# Patient Record
Sex: Female | Born: 1935 | Race: White | Hispanic: No | Smoking: Former smoker
Health system: Southern US, Community
[De-identification: ages and names within clinical notes are randomized; demographics above are authoritative.]

## PROBLEM LIST (undated history)

## (undated) DIAGNOSIS — I712 Thoracic aortic aneurysm, without rupture: Secondary | ICD-10-CM

## (undated) DIAGNOSIS — IMO0001 Reserved for inherently not codable concepts without codable children: Secondary | ICD-10-CM

## (undated) DIAGNOSIS — F329 Major depressive disorder, single episode, unspecified: Secondary | ICD-10-CM

## (undated) DIAGNOSIS — C8290 Follicular lymphoma, unspecified, unspecified site: Secondary | ICD-10-CM

## (undated) DIAGNOSIS — I10 Essential (primary) hypertension: Secondary | ICD-10-CM

## (undated) DIAGNOSIS — I7121 Aneurysm of the ascending aorta, without rupture: Secondary | ICD-10-CM

## (undated) DIAGNOSIS — F32A Depression, unspecified: Secondary | ICD-10-CM

## (undated) DIAGNOSIS — F17200 Nicotine dependence, unspecified, uncomplicated: Secondary | ICD-10-CM

## (undated) DIAGNOSIS — F419 Anxiety disorder, unspecified: Secondary | ICD-10-CM

## (undated) HISTORY — PX: TONSILLECTOMY: SUR1361

## (undated) HISTORY — DX: Nicotine dependence, unspecified, uncomplicated: F17.200

## (undated) HISTORY — DX: Reserved for inherently not codable concepts without codable children: IMO0001

## (undated) HISTORY — DX: Follicular lymphoma, unspecified, unspecified site: C82.90

## (undated) HISTORY — PX: OTHER SURGICAL HISTORY: SHX169

## (undated) HISTORY — PX: TUBAL LIGATION: SHX77

---

## 1999-10-04 ENCOUNTER — Encounter: Admission: RE | Admit: 1999-10-04 | Discharge: 1999-10-04 | Payer: Self-pay | Admitting: Family Medicine

## 1999-10-04 ENCOUNTER — Encounter: Payer: Self-pay | Admitting: Family Medicine

## 1999-11-19 ENCOUNTER — Encounter: Payer: Self-pay | Admitting: Family Medicine

## 1999-11-19 ENCOUNTER — Encounter: Admission: RE | Admit: 1999-11-19 | Discharge: 1999-11-19 | Payer: Self-pay | Admitting: Family Medicine

## 2000-02-07 ENCOUNTER — Ambulatory Visit (HOSPITAL_COMMUNITY): Admission: RE | Admit: 2000-02-07 | Discharge: 2000-02-07 | Payer: Self-pay | Admitting: Gastroenterology

## 2000-02-07 ENCOUNTER — Encounter (INDEPENDENT_AMBULATORY_CARE_PROVIDER_SITE_OTHER): Payer: Self-pay

## 2001-05-31 ENCOUNTER — Encounter: Admission: RE | Admit: 2001-05-31 | Discharge: 2001-05-31 | Payer: Self-pay | Admitting: Family Medicine

## 2001-05-31 ENCOUNTER — Encounter: Payer: Self-pay | Admitting: Family Medicine

## 2004-06-21 ENCOUNTER — Other Ambulatory Visit: Admission: RE | Admit: 2004-06-21 | Discharge: 2004-06-21 | Payer: Self-pay | Admitting: Family Medicine

## 2005-06-23 ENCOUNTER — Encounter: Admission: RE | Admit: 2005-06-23 | Discharge: 2005-06-23 | Payer: Self-pay | Admitting: Family Medicine

## 2006-10-09 ENCOUNTER — Other Ambulatory Visit: Admission: RE | Admit: 2006-10-09 | Discharge: 2006-10-09 | Payer: Self-pay | Admitting: Family Medicine

## 2007-03-29 ENCOUNTER — Encounter: Admission: RE | Admit: 2007-03-29 | Discharge: 2007-03-29 | Payer: Self-pay | Admitting: Family Medicine

## 2008-07-28 ENCOUNTER — Encounter: Admission: RE | Admit: 2008-07-28 | Discharge: 2008-07-28 | Payer: Self-pay | Admitting: Family Medicine

## 2009-06-15 ENCOUNTER — Other Ambulatory Visit: Admission: RE | Admit: 2009-06-15 | Discharge: 2009-06-15 | Payer: Self-pay | Admitting: Family Medicine

## 2010-04-23 ENCOUNTER — Encounter: Admission: RE | Admit: 2010-04-23 | Discharge: 2010-04-23 | Payer: Self-pay | Admitting: Family Medicine

## 2010-04-30 ENCOUNTER — Encounter: Admission: RE | Admit: 2010-04-30 | Discharge: 2010-04-30 | Payer: Self-pay | Admitting: Family Medicine

## 2010-05-10 ENCOUNTER — Ambulatory Visit (HOSPITAL_COMMUNITY): Admission: RE | Admit: 2010-05-10 | Discharge: 2010-05-10 | Payer: Self-pay | Admitting: Family Medicine

## 2010-05-14 ENCOUNTER — Ambulatory Visit: Payer: Self-pay | Admitting: Oncology

## 2010-05-21 LAB — COMPREHENSIVE METABOLIC PANEL
ALT: 18 U/L (ref 0–35)
AST: 25 U/L (ref 0–37)
Albumin: 4.1 g/dL (ref 3.5–5.2)
Alkaline Phosphatase: 77 U/L (ref 39–117)
Potassium: 4.2 mEq/L (ref 3.5–5.3)
Sodium: 136 mEq/L (ref 135–145)
Total Protein: 6.7 g/dL (ref 6.0–8.3)

## 2010-05-21 LAB — CBC WITH DIFFERENTIAL/PLATELET
BASO%: 0.7 % (ref 0.0–2.0)
EOS%: 3.7 % (ref 0.0–7.0)
Eosinophils Absolute: 0.3 10*3/uL (ref 0.0–0.5)
MCH: 33.8 pg (ref 25.1–34.0)
MCHC: 35 g/dL (ref 31.5–36.0)
MCV: 96.6 fL (ref 79.5–101.0)
MONO%: 10 % (ref 0.0–14.0)
NEUT#: 4.9 10*3/uL (ref 1.5–6.5)
RBC: 4.65 10*6/uL (ref 3.70–5.45)
RDW: 13.3 % (ref 11.2–14.5)

## 2010-05-24 LAB — HEPATITIS B CORE ANTIBODY, TOTAL: Hep B Core Total Ab: NEGATIVE

## 2010-05-24 LAB — HEPATITIS B CORE ANTIBODY, IGM: Hep B C IgM: NEGATIVE

## 2010-05-27 ENCOUNTER — Ambulatory Visit (HOSPITAL_COMMUNITY): Admission: RE | Admit: 2010-05-27 | Discharge: 2010-05-27 | Payer: Self-pay | Admitting: Oncology

## 2010-06-03 ENCOUNTER — Ambulatory Visit (HOSPITAL_COMMUNITY): Admission: RE | Admit: 2010-06-03 | Discharge: 2010-06-03 | Payer: Self-pay | Admitting: Oncology

## 2010-06-14 ENCOUNTER — Ambulatory Visit: Payer: Self-pay | Admitting: Oncology

## 2010-06-16 LAB — CBC WITH DIFFERENTIAL/PLATELET
BASO%: 0.7 % (ref 0.0–2.0)
Basophils Absolute: 0.1 10*3/uL (ref 0.0–0.1)
EOS%: 1.4 % (ref 0.0–7.0)
Eosinophils Absolute: 0.1 10*3/uL (ref 0.0–0.5)
HCT: 45.6 % (ref 34.8–46.6)
HGB: 16 g/dL — ABNORMAL HIGH (ref 11.6–15.9)
LYMPH%: 10.6 % — ABNORMAL LOW (ref 14.0–49.7)
MCH: 33.8 pg (ref 25.1–34.0)
MCHC: 35.1 g/dL (ref 31.5–36.0)
MCV: 96.4 fL (ref 79.5–101.0)
MONO#: 1 10*3/uL — ABNORMAL HIGH (ref 0.1–0.9)
MONO%: 12.6 % (ref 0.0–14.0)
NEUT#: 5.7 10*3/uL (ref 1.5–6.5)
NEUT%: 74.7 % (ref 38.4–76.8)
Platelets: 209 10*3/uL (ref 145–400)
RBC: 4.73 10*6/uL (ref 3.70–5.45)
RDW: 13.6 % (ref 11.2–14.5)
WBC: 7.6 10*3/uL (ref 3.9–10.3)
lymph#: 0.8 10*3/uL — ABNORMAL LOW (ref 0.9–3.3)

## 2010-06-16 LAB — LACTATE DEHYDROGENASE: LDH: 191 U/L (ref 94–250)

## 2010-06-16 LAB — COMPREHENSIVE METABOLIC PANEL
ALT: 16 U/L (ref 0–35)
AST: 25 U/L (ref 0–37)
Albumin: 4.8 g/dL (ref 3.5–5.2)
Alkaline Phosphatase: 81 U/L (ref 39–117)
BUN: 9 mg/dL (ref 6–23)
CO2: 19 mEq/L (ref 19–32)
Calcium: 10.1 mg/dL (ref 8.4–10.5)
Chloride: 102 mEq/L (ref 96–112)
Creatinine, Ser: 0.65 mg/dL (ref 0.40–1.20)
Glucose, Bld: 97 mg/dL (ref 70–99)
Potassium: 4.4 mEq/L (ref 3.5–5.3)
Sodium: 139 mEq/L (ref 135–145)
Total Bilirubin: 0.9 mg/dL (ref 0.3–1.2)
Total Protein: 7.3 g/dL (ref 6.0–8.3)

## 2010-06-16 LAB — URIC ACID: Uric Acid, Serum: 5.7 mg/dL (ref 2.4–7.0)

## 2010-06-30 LAB — COMPREHENSIVE METABOLIC PANEL
ALT: 18 U/L (ref 0–35)
CO2: 21 mEq/L (ref 19–32)
Calcium: 9.5 mg/dL (ref 8.4–10.5)
Chloride: 102 mEq/L (ref 96–112)
Creatinine, Ser: 0.69 mg/dL (ref 0.40–1.20)

## 2010-06-30 LAB — LACTATE DEHYDROGENASE: LDH: 170 U/L (ref 94–250)

## 2010-06-30 LAB — CBC WITH DIFFERENTIAL/PLATELET
BASO%: 0.2 % (ref 0.0–2.0)
Basophils Absolute: 0 10*3/uL (ref 0.0–0.1)
EOS%: 1.3 % (ref 0.0–7.0)
Eosinophils Absolute: 0.1 10*3/uL (ref 0.0–0.5)
HCT: 43.7 % (ref 34.8–46.6)
HGB: 15.3 g/dL (ref 11.6–15.9)
LYMPH%: 4.6 % — ABNORMAL LOW (ref 14.0–49.7)
MCH: 34.7 pg — ABNORMAL HIGH (ref 25.1–34.0)
MCHC: 34.9 g/dL (ref 31.5–36.0)
MCV: 99.3 fL (ref 79.5–101.0)
MONO#: 1 10*3/uL — ABNORMAL HIGH (ref 0.1–0.9)
MONO%: 17.9 % — ABNORMAL HIGH (ref 0.0–14.0)
NEUT#: 4.4 10*3/uL (ref 1.5–6.5)
NEUT%: 76 % (ref 38.4–76.8)
Platelets: 180 10*3/uL (ref 145–400)
RBC: 4.4 10*6/uL (ref 3.70–5.45)
RDW: 14 % (ref 11.2–14.5)
WBC: 5.8 10*3/uL (ref 3.9–10.3)
lymph#: 0.3 10*3/uL — ABNORMAL LOW (ref 0.9–3.3)

## 2010-07-14 ENCOUNTER — Ambulatory Visit: Payer: Self-pay | Admitting: Oncology

## 2010-07-14 LAB — CBC WITH DIFFERENTIAL/PLATELET
BASO%: 1 % (ref 0.0–2.0)
Basophils Absolute: 0.1 10*3/uL (ref 0.0–0.1)
EOS%: 1.9 % (ref 0.0–7.0)
Eosinophils Absolute: 0.1 10*3/uL (ref 0.0–0.5)
HCT: 43.9 % (ref 34.8–46.6)
HGB: 15.2 g/dL (ref 11.6–15.9)
LYMPH%: 11.6 % — ABNORMAL LOW (ref 14.0–49.7)
MCH: 33.4 pg (ref 25.1–34.0)
MCHC: 34.6 g/dL (ref 31.5–36.0)
MCV: 96.5 fL (ref 79.5–101.0)
MONO#: 0.9 10*3/uL (ref 0.1–0.9)
MONO%: 12.3 % (ref 0.0–14.0)
NEUT#: 5.3 10*3/uL (ref 1.5–6.5)
NEUT%: 73.2 % (ref 38.4–76.8)
Platelets: 141 10*3/uL — ABNORMAL LOW (ref 145–400)
RBC: 4.55 10*6/uL (ref 3.70–5.45)
RDW: 13.6 % (ref 11.2–14.5)
WBC: 7.2 10*3/uL (ref 3.9–10.3)
lymph#: 0.8 10*3/uL — ABNORMAL LOW (ref 0.9–3.3)

## 2010-07-14 LAB — LACTATE DEHYDROGENASE: LDH: 223 U/L (ref 94–250)

## 2010-07-14 LAB — COMPREHENSIVE METABOLIC PANEL
ALT: 21 U/L (ref 0–35)
AST: 27 U/L (ref 0–37)
Albumin: 4.6 g/dL (ref 3.5–5.2)
Alkaline Phosphatase: 83 U/L (ref 39–117)
BUN: 14 mg/dL (ref 6–23)
CO2: 21 mEq/L (ref 19–32)
Calcium: 9.5 mg/dL (ref 8.4–10.5)
Chloride: 102 mEq/L (ref 96–112)
Creatinine, Ser: 0.65 mg/dL (ref 0.40–1.20)
Glucose, Bld: 97 mg/dL (ref 70–99)
Potassium: 4.2 mEq/L (ref 3.5–5.3)
Sodium: 135 mEq/L (ref 135–145)
Total Bilirubin: 0.8 mg/dL (ref 0.3–1.2)
Total Protein: 6.7 g/dL (ref 6.0–8.3)

## 2010-07-14 LAB — URIC ACID: Uric Acid, Serum: 5.1 mg/dL (ref 2.4–7.0)

## 2010-08-10 LAB — CBC WITH DIFFERENTIAL/PLATELET
BASO%: 0.5 % (ref 0.0–2.0)
Basophils Absolute: 0 10*3/uL (ref 0.0–0.1)
EOS%: 1.7 % (ref 0.0–7.0)
Eosinophils Absolute: 0.1 10*3/uL (ref 0.0–0.5)
HCT: 41.7 % (ref 34.8–46.6)
HGB: 14.3 g/dL (ref 11.6–15.9)
LYMPH%: 15.2 % (ref 14.0–49.7)
MCH: 34.1 pg — ABNORMAL HIGH (ref 25.1–34.0)
MCHC: 34.3 g/dL (ref 31.5–36.0)
MCV: 99.2 fL (ref 79.5–101.0)
MONO#: 0.8 10*3/uL (ref 0.1–0.9)
MONO%: 16.2 % — ABNORMAL HIGH (ref 0.0–14.0)
NEUT#: 3.2 10*3/uL (ref 1.5–6.5)
NEUT%: 66.4 % (ref 38.4–76.8)
Platelets: 142 10*3/uL — ABNORMAL LOW (ref 145–400)
RBC: 4.21 10*6/uL (ref 3.70–5.45)
RDW: 13.6 % (ref 11.2–14.5)
WBC: 4.8 10*3/uL (ref 3.9–10.3)
lymph#: 0.7 10*3/uL — ABNORMAL LOW (ref 0.9–3.3)

## 2010-08-10 LAB — COMPREHENSIVE METABOLIC PANEL
ALT: 21 U/L (ref 0–35)
AST: 28 U/L (ref 0–37)
Albumin: 4.3 g/dL (ref 3.5–5.2)
Alkaline Phosphatase: 69 U/L (ref 39–117)
BUN: 9 mg/dL (ref 6–23)
CO2: 24 mEq/L (ref 19–32)
Calcium: 9.2 mg/dL (ref 8.4–10.5)
Chloride: 98 mEq/L (ref 96–112)
Creatinine, Ser: 0.61 mg/dL (ref 0.40–1.20)
Glucose, Bld: 84 mg/dL (ref 70–99)
Potassium: 3.8 mEq/L (ref 3.5–5.3)
Sodium: 130 mEq/L — ABNORMAL LOW (ref 135–145)
Total Bilirubin: 1.4 mg/dL — ABNORMAL HIGH (ref 0.3–1.2)
Total Protein: 6.9 g/dL (ref 6.0–8.3)

## 2010-08-10 LAB — LIPID PANEL
Cholesterol: 156 mg/dL (ref 0–200)
HDL: 67 mg/dL (ref >39)
LDL Cholesterol: 74 mg/dL (ref 0–99)
Total CHOL/HDL Ratio: 2.3 Ratio
Triglycerides: 77 mg/dL (ref <150)
VLDL: 15 mg/dL (ref 0–40)

## 2010-09-06 ENCOUNTER — Ambulatory Visit: Payer: Self-pay | Admitting: Oncology

## 2010-09-07 ENCOUNTER — Ambulatory Visit (HOSPITAL_COMMUNITY): Admission: RE | Admit: 2010-09-07 | Discharge: 2010-09-07 | Payer: Self-pay | Admitting: Oncology

## 2010-09-08 LAB — COMPREHENSIVE METABOLIC PANEL
ALT: 19 U/L (ref 0–35)
AST: 25 U/L (ref 0–37)
Albumin: 4.6 g/dL (ref 3.5–5.2)
Alkaline Phosphatase: 76 U/L (ref 39–117)
BUN: 11 mg/dL (ref 6–23)
CO2: 25 mEq/L (ref 19–32)
Calcium: 9.5 mg/dL (ref 8.4–10.5)
Chloride: 97 mEq/L (ref 96–112)
Creatinine, Ser: 0.64 mg/dL (ref 0.40–1.20)
Glucose, Bld: 92 mg/dL (ref 70–99)
Potassium: 4.3 mEq/L (ref 3.5–5.3)
Sodium: 132 mEq/L — ABNORMAL LOW (ref 135–145)
Total Bilirubin: 0.8 mg/dL (ref 0.3–1.2)
Total Protein: 6.5 g/dL (ref 6.0–8.3)

## 2010-09-08 LAB — CBC WITH DIFFERENTIAL/PLATELET
BASO%: 0.4 % (ref 0.0–2.0)
Basophils Absolute: 0 10*3/uL (ref 0.0–0.1)
EOS%: 3.1 % (ref 0.0–7.0)
Eosinophils Absolute: 0.2 10*3/uL (ref 0.0–0.5)
HCT: 40.1 % (ref 34.8–46.6)
HGB: 14.2 g/dL (ref 11.6–15.9)
LYMPH%: 8 % — ABNORMAL LOW (ref 14.0–49.7)
MCH: 34.4 pg — ABNORMAL HIGH (ref 25.1–34.0)
MCHC: 35.4 g/dL (ref 31.5–36.0)
MCV: 97.1 fL (ref 79.5–101.0)
MONO#: 0.8 10*3/uL (ref 0.1–0.9)
MONO%: 11.8 % (ref 0.0–14.0)
NEUT#: 5.3 10*3/uL (ref 1.5–6.5)
NEUT%: 76.7 % (ref 38.4–76.8)
Platelets: 119 10*3/uL — ABNORMAL LOW (ref 145–400)
RBC: 4.13 10*6/uL (ref 3.70–5.45)
RDW: 14 % (ref 11.2–14.5)
WBC: 6.9 10*3/uL (ref 3.9–10.3)
lymph#: 0.6 10*3/uL — ABNORMAL LOW (ref 0.9–3.3)
nRBC: 0 % (ref 0–0)

## 2010-10-06 ENCOUNTER — Ambulatory Visit: Payer: Self-pay | Admitting: Oncology

## 2010-10-06 LAB — COMPREHENSIVE METABOLIC PANEL
CO2: 22 mEq/L (ref 19–32)
Creatinine, Ser: 0.62 mg/dL (ref 0.40–1.20)
Glucose, Bld: 98 mg/dL (ref 70–99)
Sodium: 139 mEq/L (ref 135–145)
Total Bilirubin: 0.8 mg/dL (ref 0.3–1.2)
Total Protein: 6.4 g/dL (ref 6.0–8.3)

## 2010-10-06 LAB — CBC WITH DIFFERENTIAL/PLATELET
Basophils Absolute: 0 10*3/uL (ref 0.0–0.1)
Eosinophils Absolute: 0.1 10*3/uL (ref 0.0–0.5)
HCT: 38.9 % (ref 34.8–46.6)
LYMPH%: 9.4 % — ABNORMAL LOW (ref 14.0–49.7)
MONO#: 0.6 10*3/uL (ref 0.1–0.9)
NEUT#: 3.6 10*3/uL (ref 1.5–6.5)
NEUT%: 75.1 % (ref 38.4–76.8)
Platelets: 106 10*3/uL — ABNORMAL LOW (ref 145–400)
WBC: 4.7 10*3/uL (ref 3.9–10.3)

## 2010-10-06 LAB — MAGNESIUM: Magnesium: 1.8 mg/dL (ref 1.5–2.5)

## 2010-11-03 LAB — CBC WITH DIFFERENTIAL/PLATELET
BASO%: 1.9 % (ref 0.0–2.0)
Basophils Absolute: 0 10*3/uL (ref 0.0–0.1)
EOS%: 2.8 % (ref 0.0–7.0)
Eosinophils Absolute: 0.1 10*3/uL (ref 0.0–0.5)
HCT: 37.7 % (ref 34.8–46.6)
HGB: 13.4 g/dL (ref 11.6–15.9)
LYMPH%: 37 % (ref 14.0–49.7)
MCH: 34.8 pg — ABNORMAL HIGH (ref 25.1–34.0)
MCHC: 35.5 g/dL (ref 31.5–36.0)
MCV: 97.9 fL (ref 79.5–101.0)
MONO#: 0.8 10*3/uL (ref 0.1–0.9)
MONO%: 36.5 % — ABNORMAL HIGH (ref 0.0–14.0)
NEUT#: 0.5 10*3/uL — ABNORMAL LOW (ref 1.5–6.5)
NEUT%: 21.8 % — ABNORMAL LOW (ref 38.4–76.8)
Platelets: 110 10*3/uL — ABNORMAL LOW (ref 145–400)
RBC: 3.85 10*6/uL (ref 3.70–5.45)
RDW: 13.5 % (ref 11.2–14.5)
WBC: 2.1 10*3/uL — ABNORMAL LOW (ref 3.9–10.3)
lymph#: 0.8 10*3/uL — ABNORMAL LOW (ref 0.9–3.3)

## 2010-11-05 ENCOUNTER — Ambulatory Visit: Payer: Self-pay | Admitting: Oncology

## 2010-11-08 LAB — CBC WITH DIFFERENTIAL/PLATELET
Basophils Absolute: 0.1 10*3/uL (ref 0.0–0.1)
EOS%: 0.9 % (ref 0.0–7.0)
HCT: 40 % (ref 34.8–46.6)
HGB: 13.8 g/dL (ref 11.6–15.9)
LYMPH%: 3.6 % — ABNORMAL LOW (ref 14.0–49.7)
MCH: 35.3 pg — ABNORMAL HIGH (ref 25.1–34.0)
MCHC: 34.6 g/dL (ref 31.5–36.0)
MCV: 102.2 fL — ABNORMAL HIGH (ref 79.5–101.0)
NEUT%: 88.9 % — ABNORMAL HIGH (ref 38.4–76.8)
Platelets: 133 10*3/uL — ABNORMAL LOW (ref 145–400)
lymph#: 1.1 10*3/uL (ref 0.9–3.3)

## 2010-11-08 LAB — COMPREHENSIVE METABOLIC PANEL
ALT: 15 U/L (ref 0–35)
AST: 26 U/L (ref 0–37)
Alkaline Phosphatase: 134 U/L — ABNORMAL HIGH (ref 39–117)
Creatinine, Ser: 0.71 mg/dL (ref 0.40–1.20)
Sodium: 134 mEq/L — ABNORMAL LOW (ref 135–145)
Total Bilirubin: 0.5 mg/dL (ref 0.3–1.2)
Total Protein: 6.2 g/dL (ref 6.0–8.3)

## 2010-11-08 LAB — LACTATE DEHYDROGENASE: LDH: 353 U/L — ABNORMAL HIGH (ref 94–250)

## 2010-11-08 LAB — MAGNESIUM: Magnesium: 1.7 mg/dL (ref 1.5–2.5)

## 2010-12-02 LAB — CBC WITH DIFFERENTIAL/PLATELET
BASO%: 0.5 % (ref 0.0–2.0)
Basophils Absolute: 0 10e3/uL (ref 0.0–0.1)
EOS%: 1.9 % (ref 0.0–7.0)
Eosinophils Absolute: 0.1 10e3/uL (ref 0.0–0.5)
HCT: 39.1 % (ref 34.8–46.6)
HGB: 13.4 g/dL (ref 11.6–15.9)
LYMPH%: 5.1 % — ABNORMAL LOW (ref 14.0–49.7)
MCH: 35.4 pg — ABNORMAL HIGH (ref 25.1–34.0)
MCHC: 34.2 g/dL (ref 31.5–36.0)
MCV: 103.4 fL — ABNORMAL HIGH (ref 79.5–101.0)
MONO#: 0.9 10e3/uL (ref 0.1–0.9)
MONO%: 15.1 % — ABNORMAL HIGH (ref 0.0–14.0)
NEUT#: 4.6 10e3/uL (ref 1.5–6.5)
NEUT%: 77.4 % — ABNORMAL HIGH (ref 38.4–76.8)
Platelets: 203 10e3/uL (ref 145–400)
RBC: 3.78 10e6/uL (ref 3.70–5.45)
RDW: 14.6 % — ABNORMAL HIGH (ref 11.2–14.5)
WBC: 5.9 10e3/uL (ref 3.9–10.3)
lymph#: 0.3 10e3/uL — ABNORMAL LOW (ref 0.9–3.3)

## 2010-12-02 LAB — COMPREHENSIVE METABOLIC PANEL
AST: 25 U/L (ref 0–37)
Alkaline Phosphatase: 108 U/L (ref 39–117)
BUN: 12 mg/dL (ref 6–23)
Creatinine, Ser: 0.62 mg/dL (ref 0.40–1.20)
Potassium: 4.2 mEq/L (ref 3.5–5.3)

## 2010-12-02 LAB — MAGNESIUM: Magnesium: 1.6 mg/dL (ref 1.5–2.5)

## 2010-12-08 ENCOUNTER — Ambulatory Visit: Payer: Self-pay | Admitting: Oncology

## 2010-12-08 ENCOUNTER — Ambulatory Visit (HOSPITAL_COMMUNITY)
Admission: RE | Admit: 2010-12-08 | Discharge: 2010-12-08 | Payer: Self-pay | Source: Home / Self Care | Attending: Oncology | Admitting: Oncology

## 2010-12-08 LAB — GLUCOSE, CAPILLARY: Glucose-Capillary: 91 mg/dL (ref 70–99)

## 2010-12-09 LAB — CBC WITH DIFFERENTIAL/PLATELET
BASO%: 0.3 % (ref 0.0–2.0)
Basophils Absolute: 0 10*3/uL (ref 0.0–0.1)
EOS%: 3 % (ref 0.0–7.0)
Eosinophils Absolute: 0.2 10*3/uL (ref 0.0–0.5)
HCT: 38.6 % (ref 34.8–46.6)
HGB: 13.6 g/dL (ref 11.6–15.9)
LYMPH%: 5.1 % — ABNORMAL LOW (ref 14.0–49.7)
MCH: 35.1 pg — ABNORMAL HIGH (ref 25.1–34.0)
MCHC: 35.2 g/dL (ref 31.5–36.0)
MCV: 99.5 fL (ref 79.5–101.0)
MONO#: 0.8 10*3/uL (ref 0.1–0.9)
MONO%: 13.7 % (ref 0.0–14.0)
NEUT#: 4.6 10*3/uL (ref 1.5–6.5)
NEUT%: 77.9 % — ABNORMAL HIGH (ref 38.4–76.8)
Platelets: 163 10*3/uL (ref 145–400)
RBC: 3.88 10*6/uL (ref 3.70–5.45)
RDW: 13.7 % (ref 11.2–14.5)
WBC: 5.9 10*3/uL (ref 3.9–10.3)
lymph#: 0.3 10*3/uL — ABNORMAL LOW (ref 0.9–3.3)
nRBC: 0 % (ref 0–0)

## 2011-01-13 ENCOUNTER — Other Ambulatory Visit: Payer: Self-pay | Admitting: Oncology

## 2011-01-13 ENCOUNTER — Encounter (HOSPITAL_BASED_OUTPATIENT_CLINIC_OR_DEPARTMENT_OTHER): Payer: Medicare Other | Admitting: Oncology

## 2011-01-13 DIAGNOSIS — Z5112 Encounter for antineoplastic immunotherapy: Secondary | ICD-10-CM

## 2011-01-13 DIAGNOSIS — I10 Essential (primary) hypertension: Secondary | ICD-10-CM

## 2011-01-13 DIAGNOSIS — Z79899 Other long term (current) drug therapy: Secondary | ICD-10-CM

## 2011-01-13 DIAGNOSIS — C8589 Other specified types of non-Hodgkin lymphoma, extranodal and solid organ sites: Secondary | ICD-10-CM

## 2011-01-13 LAB — CBC WITH DIFFERENTIAL/PLATELET
BASO%: 0.5 % (ref 0.0–2.0)
Basophils Absolute: 0 10*3/uL (ref 0.0–0.1)
EOS%: 3 % (ref 0.0–7.0)
Eosinophils Absolute: 0.2 10*3/uL (ref 0.0–0.5)
HCT: 38.8 % (ref 34.8–46.6)
HGB: 13.6 g/dL (ref 11.6–15.9)
LYMPH%: 10.8 % — ABNORMAL LOW (ref 14.0–49.7)
MCH: 34.9 pg — ABNORMAL HIGH (ref 25.1–34.0)
MCHC: 35.1 g/dL (ref 31.5–36.0)
MCV: 99.5 fL (ref 79.5–101.0)
MONO#: 0.5 10*3/uL (ref 0.1–0.9)
MONO%: 9 % (ref 0.0–14.0)
NEUT#: 4.3 10*3/uL (ref 1.5–6.5)
NEUT%: 76.7 % (ref 38.4–76.8)
Platelets: 155 10*3/uL (ref 145–400)
RBC: 3.9 10*6/uL (ref 3.70–5.45)
RDW: 13.4 % (ref 11.2–14.5)
WBC: 5.6 10*3/uL (ref 3.9–10.3)
lymph#: 0.6 10*3/uL — ABNORMAL LOW (ref 0.9–3.3)
nRBC: 0 % (ref 0–0)

## 2011-01-13 LAB — COMPREHENSIVE METABOLIC PANEL
ALT: 13 U/L (ref 0–35)
AST: 19 U/L (ref 0–37)
Albumin: 3.9 g/dL (ref 3.5–5.2)
Alkaline Phosphatase: 63 U/L (ref 39–117)
BUN: 14 mg/dL (ref 6–23)
CO2: 26 mEq/L (ref 19–32)
Calcium: 8.9 mg/dL (ref 8.4–10.5)
Chloride: 104 mEq/L (ref 96–112)
Creatinine, Ser: 0.69 mg/dL (ref 0.40–1.20)
Glucose, Bld: 98 mg/dL (ref 70–99)
Potassium: 3.7 mEq/L (ref 3.5–5.3)
Sodium: 140 mEq/L (ref 135–145)
Total Bilirubin: 0.6 mg/dL (ref 0.3–1.2)
Total Protein: 5.6 g/dL — ABNORMAL LOW (ref 6.0–8.3)

## 2011-01-13 LAB — LACTATE DEHYDROGENASE: LDH: 138 U/L (ref 94–250)

## 2011-02-17 LAB — GLUCOSE, CAPILLARY: Glucose-Capillary: 98 mg/dL (ref 70–99)

## 2011-02-20 LAB — CBC
Hemoglobin: 16.4 g/dL — ABNORMAL HIGH (ref 12.0–15.0)
MCH: 34.4 pg — ABNORMAL HIGH (ref 26.0–34.0)
MCHC: 34.5 g/dL (ref 30.0–36.0)

## 2011-02-20 LAB — PROTIME-INR: Prothrombin Time: 12.4 seconds (ref 11.6–15.2)

## 2011-02-20 LAB — CHROMOSOME ANALYSIS, BONE MARROW

## 2011-02-21 LAB — CBC
MCHC: 34.5 g/dL (ref 30.0–36.0)
MCV: 99.9 fL (ref 78.0–100.0)
Platelets: 235 10*3/uL (ref 150–400)
RDW: 13.5 % (ref 11.5–15.5)
WBC: 7.5 10*3/uL (ref 4.0–10.5)

## 2011-02-21 LAB — BASIC METABOLIC PANEL
BUN: 11 mg/dL (ref 6–23)
CO2: 25 mEq/L (ref 19–32)
Chloride: 104 mEq/L (ref 96–112)
Creatinine, Ser: 0.68 mg/dL (ref 0.4–1.2)
Glucose, Bld: 103 mg/dL — ABNORMAL HIGH (ref 70–99)

## 2011-02-21 LAB — PROTIME-INR: Prothrombin Time: 12 seconds (ref 11.6–15.2)

## 2011-03-16 ENCOUNTER — Other Ambulatory Visit: Payer: Self-pay | Admitting: Oncology

## 2011-03-16 ENCOUNTER — Encounter (HOSPITAL_BASED_OUTPATIENT_CLINIC_OR_DEPARTMENT_OTHER): Payer: Medicare Other | Admitting: Oncology

## 2011-03-16 DIAGNOSIS — C8589 Other specified types of non-Hodgkin lymphoma, extranodal and solid organ sites: Secondary | ICD-10-CM

## 2011-03-16 DIAGNOSIS — C8590 Non-Hodgkin lymphoma, unspecified, unspecified site: Secondary | ICD-10-CM

## 2011-03-16 DIAGNOSIS — I10 Essential (primary) hypertension: Secondary | ICD-10-CM

## 2011-03-16 DIAGNOSIS — Z5112 Encounter for antineoplastic immunotherapy: Secondary | ICD-10-CM

## 2011-03-16 LAB — CBC WITH DIFFERENTIAL/PLATELET
BASO%: 0.3 % (ref 0.0–2.0)
LYMPH%: 8.8 % — ABNORMAL LOW (ref 14.0–49.7)
MCH: 35.3 pg — ABNORMAL HIGH (ref 25.1–34.0)
MCHC: 34.4 g/dL (ref 31.5–36.0)
MCV: 102.6 fL — ABNORMAL HIGH (ref 79.5–101.0)
MONO%: 9.2 % (ref 0.0–14.0)
NEUT%: 79.9 % — ABNORMAL HIGH (ref 38.4–76.8)
Platelets: 173 10*3/uL (ref 145–400)
RBC: 3.95 10*6/uL (ref 3.70–5.45)

## 2011-03-16 LAB — COMPREHENSIVE METABOLIC PANEL
ALT: 18 U/L (ref 0–35)
Alkaline Phosphatase: 74 U/L (ref 39–117)
Creatinine, Ser: 0.72 mg/dL (ref 0.40–1.20)
Sodium: 132 mEq/L — ABNORMAL LOW (ref 135–145)
Total Bilirubin: 0.7 mg/dL (ref 0.3–1.2)
Total Protein: 5.9 g/dL — ABNORMAL LOW (ref 6.0–8.3)

## 2011-03-17 ENCOUNTER — Encounter (HOSPITAL_BASED_OUTPATIENT_CLINIC_OR_DEPARTMENT_OTHER): Payer: Medicare Other | Admitting: Oncology

## 2011-03-17 DIAGNOSIS — Z5112 Encounter for antineoplastic immunotherapy: Secondary | ICD-10-CM

## 2011-03-17 DIAGNOSIS — C8589 Other specified types of non-Hodgkin lymphoma, extranodal and solid organ sites: Secondary | ICD-10-CM

## 2011-05-13 ENCOUNTER — Other Ambulatory Visit: Payer: Self-pay | Admitting: Oncology

## 2011-05-13 ENCOUNTER — Encounter (HOSPITAL_COMMUNITY): Payer: Self-pay

## 2011-05-13 ENCOUNTER — Ambulatory Visit (HOSPITAL_COMMUNITY)
Admission: RE | Admit: 2011-05-13 | Discharge: 2011-05-13 | Disposition: A | Discharge status: Home / Self Care | Payer: Medicare Other | Source: Ambulatory Visit | Attending: Oncology | Admitting: Oncology

## 2011-05-13 ENCOUNTER — Encounter (HOSPITAL_BASED_OUTPATIENT_CLINIC_OR_DEPARTMENT_OTHER): Payer: Medicare Other | Admitting: Oncology

## 2011-05-13 DIAGNOSIS — M899 Disorder of bone, unspecified: Secondary | ICD-10-CM | POA: Insufficient documentation

## 2011-05-13 DIAGNOSIS — I712 Thoracic aortic aneurysm, without rupture, unspecified: Secondary | ICD-10-CM | POA: Insufficient documentation

## 2011-05-13 DIAGNOSIS — E0789 Other specified disorders of thyroid: Secondary | ICD-10-CM | POA: Insufficient documentation

## 2011-05-13 DIAGNOSIS — I700 Atherosclerosis of aorta: Secondary | ICD-10-CM | POA: Insufficient documentation

## 2011-05-13 DIAGNOSIS — I2510 Atherosclerotic heart disease of native coronary artery without angina pectoris: Secondary | ICD-10-CM | POA: Insufficient documentation

## 2011-05-13 DIAGNOSIS — C8590 Non-Hodgkin lymphoma, unspecified, unspecified site: Secondary | ICD-10-CM

## 2011-05-13 DIAGNOSIS — C8589 Other specified types of non-Hodgkin lymphoma, extranodal and solid organ sites: Secondary | ICD-10-CM | POA: Insufficient documentation

## 2011-05-13 DIAGNOSIS — M949 Disorder of cartilage, unspecified: Secondary | ICD-10-CM | POA: Insufficient documentation

## 2011-05-13 DIAGNOSIS — Z5112 Encounter for antineoplastic immunotherapy: Secondary | ICD-10-CM

## 2011-05-13 LAB — CMP (CANCER CENTER ONLY)
ALT(SGPT): 27 U/L (ref 10–47)
BUN, Bld: 10 mg/dL (ref 7–22)
CO2: 27 mEq/L (ref 18–33)
Calcium: 9.3 mg/dL (ref 8.0–10.3)
Chloride: 92 mEq/L — ABNORMAL LOW (ref 98–108)
Creat: 0.6 mg/dl (ref 0.6–1.2)
Total Bilirubin: 0.9 mg/dl (ref 0.20–1.60)

## 2011-05-13 LAB — CBC WITH DIFFERENTIAL/PLATELET
BASO%: 0 % (ref 0.0–2.0)
Basophils Absolute: 0 10*3/uL (ref 0.0–0.1)
HCT: 41.6 % (ref 34.8–46.6)
HGB: 14.3 g/dL (ref 11.6–15.9)
LYMPH%: 8.9 % — ABNORMAL LOW (ref 14.0–49.7)
MCH: 35.3 pg — ABNORMAL HIGH (ref 25.1–34.0)
MCHC: 34.4 g/dL (ref 31.5–36.0)
MONO#: 0.6 10*3/uL (ref 0.1–0.9)
NEUT%: 79.7 % — ABNORMAL HIGH (ref 38.4–76.8)
Platelets: 167 10*3/uL (ref 145–400)
WBC: 6.3 10*3/uL (ref 3.9–10.3)
lymph#: 0.6 10*3/uL — ABNORMAL LOW (ref 0.9–3.3)

## 2011-05-13 LAB — LACTATE DEHYDROGENASE: LDH: 175 U/L (ref 94–250)

## 2011-05-13 MED ORDER — IOHEXOL 300 MG/ML  SOLN
100.0000 mL | Freq: Once | INTRAMUSCULAR | Status: AC | PRN
  Administered 2011-05-13: 100 mL via INTRAVENOUS

## 2011-05-16 ENCOUNTER — Encounter (HOSPITAL_BASED_OUTPATIENT_CLINIC_OR_DEPARTMENT_OTHER): Payer: Medicare Other | Admitting: Oncology

## 2011-05-16 DIAGNOSIS — Z5111 Encounter for antineoplastic chemotherapy: Secondary | ICD-10-CM

## 2011-05-16 DIAGNOSIS — C8589 Other specified types of non-Hodgkin lymphoma, extranodal and solid organ sites: Secondary | ICD-10-CM

## 2011-06-07 IMAGING — PT NM PET TUM IMG RESTAG (PS) SKULL BASE T - THIGH
2 series · 25 of 25 positions shown · non-contrast
Comparison: 09/07/2010

CLINICAL DATA: Subsequent treatment strategy for lymphoma.  Last
chemotherapy 01-02 weeks ago.  Radiation therapy [DATE].

NUCLEAR MEDICINE PET CT SKULL BASE TO THIGH
TECHNIQUE: 18.6 mCi F-18 FDG was injected intravenously via the
left hand.  Full-ring PET imaging was performed from the skull base
through the mid-thighs 66  minutes after injection.  CT data was
obtained and used for attenuation correction and anatomic
localization only.  (This was not acquired as a diagnostic CT
examination.)
Fasting Blood Glucose:  91

[Series 151: reformatted · axial · 3.3mm · 3.91mm/px · z∈[-735,-9]mm · 19 of 221 slices shown (1 of 2)]
[im 1/221]
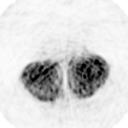
[im 13/221]
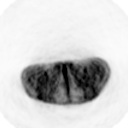
[im 25/221]
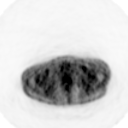
[im 37/221]
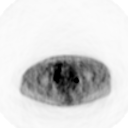
[im 49/221]
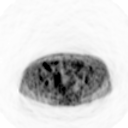
[im 62/221]
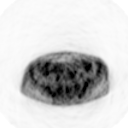
[im 74/221]
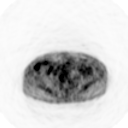
[im 86/221]
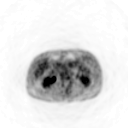
[im 98/221]
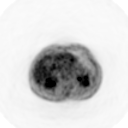
[im 111/221]
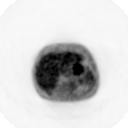
[im 123/221]
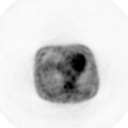
[im 135/221]
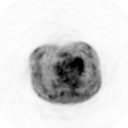
[im 147/221]
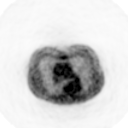
[im 159/221]
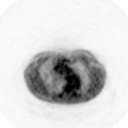
[im 172/221]
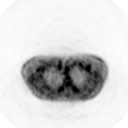
[im 184/221]
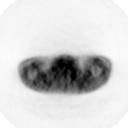
[im 196/221]
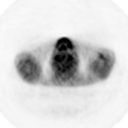
[im 208/221]
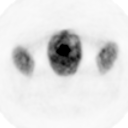
[im 221/221]
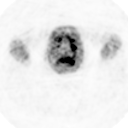

[Series 153: reformatted · coronal · 4.7mm · 5.83mm/px · 6 of 63 slices shown (2 of 2)]
[im 1/63]
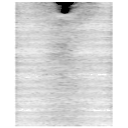
[im 13/63]
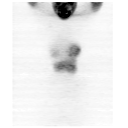
[im 25/63]
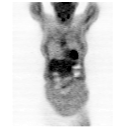
[im 38/63]
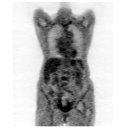
[im 50/63]
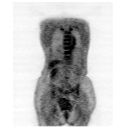
[im 63/63]
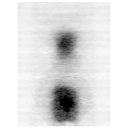

[25 of 25 positions shown; findings below may reference images not displayed]

FINDINGS: PET images demonstrate hypermetabolic "brown" fat within
the posterior aspect of the chest bilaterally.  This mildly
degrades evaluation for subtle residual disease.

No evidence of hypermetabolic residual disease.  Soft tissue
thickening about the parasternal anterior right chest wall
including on image 77 is slightly decreased (1.2 cm today versus
1.5 cm at the same level on the prior exam.) Not hypermetabolic.

No abnormal activity within the abdomen or pelvis

CT images performed for attenuation correction demonstrate fluid
within the right maxillary sinus.  Right-sided thyroidectomy.
Ascending aortic size upper limits of normal 3.8 cm.  There is a
mild to moderate pectus excavatum deformity.  Moderate
centrilobular emphysema.

Small gallstones.  Moderate osteopenia
IMPRESSION: 1.  No evidence of residual/recurrent hypermetabolic lymphoma.
2.  Mildly degraded by hypermetabolic "brown" fat.
3.  Further decrease in soft tissue thickening about the right
parasternal region.  Non FDG avid.
4.  Incidental findings, including sinus disease and gallstones.

## 2011-07-15 ENCOUNTER — Other Ambulatory Visit: Payer: Self-pay | Admitting: Oncology

## 2011-07-15 ENCOUNTER — Encounter (HOSPITAL_BASED_OUTPATIENT_CLINIC_OR_DEPARTMENT_OTHER): Payer: Medicare Other | Admitting: Oncology

## 2011-07-15 DIAGNOSIS — D72819 Decreased white blood cell count, unspecified: Secondary | ICD-10-CM

## 2011-07-15 DIAGNOSIS — I10 Essential (primary) hypertension: Secondary | ICD-10-CM

## 2011-07-15 DIAGNOSIS — Z5112 Encounter for antineoplastic immunotherapy: Secondary | ICD-10-CM

## 2011-07-15 DIAGNOSIS — C8590 Non-Hodgkin lymphoma, unspecified, unspecified site: Secondary | ICD-10-CM

## 2011-07-15 DIAGNOSIS — C8589 Other specified types of non-Hodgkin lymphoma, extranodal and solid organ sites: Secondary | ICD-10-CM

## 2011-07-15 LAB — CBC WITH DIFFERENTIAL/PLATELET
Basophils Absolute: 0 10*3/uL (ref 0.0–0.1)
EOS%: 3.7 % (ref 0.0–7.0)
Eosinophils Absolute: 0.1 10*3/uL (ref 0.0–0.5)
HCT: 40.9 % (ref 34.8–46.6)
HGB: 14.4 g/dL (ref 11.6–15.9)
MCH: 35 pg — ABNORMAL HIGH (ref 25.1–34.0)
MONO#: 0.8 10*3/uL (ref 0.1–0.9)
NEUT#: 1.8 10*3/uL (ref 1.5–6.5)
NEUT%: 52.4 % (ref 38.4–76.8)
RDW: 13 % (ref 11.2–14.5)
WBC: 3.5 10*3/uL — ABNORMAL LOW (ref 3.9–10.3)
lymph#: 0.7 10*3/uL — ABNORMAL LOW (ref 0.9–3.3)

## 2011-07-15 LAB — COMPREHENSIVE METABOLIC PANEL
AST: 28 U/L (ref 0–37)
Albumin: 4.4 g/dL (ref 3.5–5.2)
BUN: 13 mg/dL (ref 6–23)
CO2: 21 mEq/L (ref 19–32)
Calcium: 9.1 mg/dL (ref 8.4–10.5)
Chloride: 96 mEq/L (ref 96–112)
Creatinine, Ser: 0.63 mg/dL (ref 0.50–1.10)
Potassium: 3.8 mEq/L (ref 3.5–5.3)

## 2011-07-15 LAB — LACTATE DEHYDROGENASE: LDH: 196 U/L (ref 94–250)

## 2011-09-15 ENCOUNTER — Other Ambulatory Visit: Payer: Self-pay | Admitting: Oncology

## 2011-09-15 ENCOUNTER — Encounter: Payer: Self-pay | Admitting: Oncology

## 2011-09-15 ENCOUNTER — Encounter (HOSPITAL_BASED_OUTPATIENT_CLINIC_OR_DEPARTMENT_OTHER): Payer: Medicare Other | Admitting: Oncology

## 2011-09-15 DIAGNOSIS — F17200 Nicotine dependence, unspecified, uncomplicated: Secondary | ICD-10-CM

## 2011-09-15 DIAGNOSIS — C8299 Follicular lymphoma, unspecified, extranodal and solid organ sites: Secondary | ICD-10-CM

## 2011-09-15 DIAGNOSIS — C8589 Other specified types of non-Hodgkin lymphoma, extranodal and solid organ sites: Secondary | ICD-10-CM

## 2011-09-15 DIAGNOSIS — Z5112 Encounter for antineoplastic immunotherapy: Secondary | ICD-10-CM

## 2011-09-15 DIAGNOSIS — I10 Essential (primary) hypertension: Secondary | ICD-10-CM

## 2011-09-15 DIAGNOSIS — C8290 Follicular lymphoma, unspecified, unspecified site: Secondary | ICD-10-CM

## 2011-09-15 DIAGNOSIS — Z8572 Personal history of non-Hodgkin lymphomas: Secondary | ICD-10-CM | POA: Insufficient documentation

## 2011-09-15 DIAGNOSIS — D72819 Decreased white blood cell count, unspecified: Secondary | ICD-10-CM

## 2011-09-15 LAB — COMPREHENSIVE METABOLIC PANEL
ALT: 19 U/L (ref 0–35)
BUN: 12 mg/dL (ref 6–23)
CO2: 23 mEq/L (ref 19–32)
Creatinine, Ser: 0.63 mg/dL (ref 0.50–1.10)
Glucose, Bld: 85 mg/dL (ref 70–99)
Total Bilirubin: 0.8 mg/dL (ref 0.3–1.2)

## 2011-09-15 LAB — CBC WITH DIFFERENTIAL/PLATELET
BASO%: 0.4 % (ref 0.0–2.0)
Basophils Absolute: 0 10*3/uL (ref 0.0–0.1)
HCT: 42 % (ref 34.8–46.6)
HGB: 14.6 g/dL (ref 11.6–15.9)
LYMPH%: 11.9 % — ABNORMAL LOW (ref 14.0–49.7)
MCHC: 34.7 g/dL (ref 31.5–36.0)
MONO#: 0.8 10*3/uL (ref 0.1–0.9)
NEUT#: 4 10*3/uL (ref 1.5–6.5)
NEUT%: 71.5 % (ref 38.4–76.8)
Platelets: 165 10*3/uL (ref 145–400)

## 2011-09-15 LAB — LACTATE DEHYDROGENASE: LDH: 162 U/L (ref 94–250)

## 2011-09-16 ENCOUNTER — Encounter (HOSPITAL_BASED_OUTPATIENT_CLINIC_OR_DEPARTMENT_OTHER): Payer: Medicare Other | Admitting: Oncology

## 2011-09-16 DIAGNOSIS — Z5112 Encounter for antineoplastic immunotherapy: Secondary | ICD-10-CM

## 2011-09-16 DIAGNOSIS — C8589 Other specified types of non-Hodgkin lymphoma, extranodal and solid organ sites: Secondary | ICD-10-CM

## 2011-11-07 ENCOUNTER — Encounter: Payer: Self-pay | Admitting: *Deleted

## 2011-11-17 ENCOUNTER — Ambulatory Visit: Payer: Medicare Other | Admitting: Oncology

## 2011-11-17 ENCOUNTER — Ambulatory Visit (HOSPITAL_COMMUNITY)
Admission: RE | Admit: 2011-11-17 | Discharge: 2011-11-17 | Disposition: A | Discharge status: Home / Self Care | Payer: Medicare Other | Source: Ambulatory Visit | Attending: Oncology | Admitting: Oncology

## 2011-11-17 ENCOUNTER — Other Ambulatory Visit: Payer: Self-pay | Admitting: Oncology

## 2011-11-17 ENCOUNTER — Ambulatory Visit: Payer: Medicare Other

## 2011-11-17 ENCOUNTER — Other Ambulatory Visit (HOSPITAL_BASED_OUTPATIENT_CLINIC_OR_DEPARTMENT_OTHER): Payer: Medicare Other | Admitting: Lab

## 2011-11-17 DIAGNOSIS — I77819 Aortic ectasia, unspecified site: Secondary | ICD-10-CM | POA: Insufficient documentation

## 2011-11-17 DIAGNOSIS — M5146 Schmorl's nodes, lumbar region: Secondary | ICD-10-CM | POA: Insufficient documentation

## 2011-11-17 DIAGNOSIS — Q762 Congenital spondylolisthesis: Secondary | ICD-10-CM | POA: Insufficient documentation

## 2011-11-17 DIAGNOSIS — C8589 Other specified types of non-Hodgkin lymphoma, extranodal and solid organ sites: Secondary | ICD-10-CM

## 2011-11-17 DIAGNOSIS — Z79899 Other long term (current) drug therapy: Secondary | ICD-10-CM | POA: Insufficient documentation

## 2011-11-17 DIAGNOSIS — I7090 Unspecified atherosclerosis: Secondary | ICD-10-CM | POA: Insufficient documentation

## 2011-11-17 DIAGNOSIS — D72819 Decreased white blood cell count, unspecified: Secondary | ICD-10-CM

## 2011-11-17 DIAGNOSIS — Z5112 Encounter for antineoplastic immunotherapy: Secondary | ICD-10-CM

## 2011-11-17 DIAGNOSIS — C8590 Non-Hodgkin lymphoma, unspecified, unspecified site: Secondary | ICD-10-CM

## 2011-11-17 DIAGNOSIS — I10 Essential (primary) hypertension: Secondary | ICD-10-CM

## 2011-11-17 LAB — CMP (CANCER CENTER ONLY)
ALT(SGPT): 24 U/L (ref 10–47)
AST: 31 U/L (ref 11–38)
Albumin: 4.5 g/dL (ref 3.3–5.5)
BUN, Bld: 13 mg/dL (ref 7–22)
Calcium: 9.6 mg/dL (ref 8.0–10.3)
Chloride: 96 mEq/L — ABNORMAL LOW (ref 98–108)
Potassium: 4.2 mEq/L (ref 3.3–4.7)

## 2011-11-17 LAB — CBC WITH DIFFERENTIAL/PLATELET
Eosinophils Absolute: 0.2 10*3/uL (ref 0.0–0.5)
HCT: 44.6 % (ref 34.8–46.6)
LYMPH%: 10.6 % — ABNORMAL LOW (ref 14.0–49.7)
MCHC: 35.7 g/dL (ref 31.5–36.0)
MCV: 98.7 fL (ref 79.5–101.0)
MONO#: 0.7 10*3/uL (ref 0.1–0.9)
MONO%: 11.3 % (ref 0.0–14.0)
NEUT#: 4.7 10*3/uL (ref 1.5–6.5)
NEUT%: 73.9 % (ref 38.4–76.8)
Platelets: 204 10*3/uL (ref 145–400)
RBC: 4.52 10*6/uL (ref 3.70–5.45)
WBC: 6.4 10*3/uL (ref 3.9–10.3)
nRBC: 0 % (ref 0–0)

## 2011-11-17 MED ORDER — IOHEXOL 300 MG/ML  SOLN
100.0000 mL | Freq: Once | INTRAMUSCULAR | Status: AC | PRN
  Administered 2011-11-17: 100 mL via INTRAVENOUS

## 2011-11-18 ENCOUNTER — Telehealth: Payer: Self-pay | Admitting: Oncology

## 2011-11-18 ENCOUNTER — Ambulatory Visit (HOSPITAL_BASED_OUTPATIENT_CLINIC_OR_DEPARTMENT_OTHER): Payer: Medicare Other

## 2011-11-18 ENCOUNTER — Ambulatory Visit (HOSPITAL_BASED_OUTPATIENT_CLINIC_OR_DEPARTMENT_OTHER): Payer: Medicare Other | Admitting: Oncology

## 2011-11-18 ENCOUNTER — Other Ambulatory Visit: Payer: Self-pay | Admitting: Oncology

## 2011-11-18 VITALS — BP 135/76 | HR 72 | Temp 97.8°F | Ht 65.0 in | Wt 115.0 lb

## 2011-11-18 VITALS — BP 153/83 | HR 86 | Temp 97.3°F | Wt 115.9 lb

## 2011-11-18 DIAGNOSIS — F17200 Nicotine dependence, unspecified, uncomplicated: Secondary | ICD-10-CM

## 2011-11-18 DIAGNOSIS — C8290 Follicular lymphoma, unspecified, unspecified site: Secondary | ICD-10-CM

## 2011-11-18 DIAGNOSIS — C8589 Other specified types of non-Hodgkin lymphoma, extranodal and solid organ sites: Secondary | ICD-10-CM

## 2011-11-18 DIAGNOSIS — I10 Essential (primary) hypertension: Secondary | ICD-10-CM

## 2011-11-18 DIAGNOSIS — Z5112 Encounter for antineoplastic immunotherapy: Secondary | ICD-10-CM

## 2011-11-18 MED ORDER — SODIUM CHLORIDE 0.9 % IV SOLN
Freq: Once | INTRAVENOUS | Status: AC
  Administered 2011-11-18: 10:00:00 via INTRAVENOUS

## 2011-11-18 MED ORDER — ACETAMINOPHEN 325 MG PO TABS
650.0000 mg | ORAL_TABLET | Freq: Once | ORAL | Status: AC
  Administered 2011-11-18: 650 mg via ORAL

## 2011-11-18 MED ORDER — SODIUM CHLORIDE 0.9 % IV SOLN
375.0000 mg/m2 | Freq: Once | INTRAVENOUS | Status: AC
  Administered 2011-11-18: 600 mg via INTRAVENOUS
  Filled 2011-11-18: qty 60

## 2011-11-18 MED ORDER — DIPHENHYDRAMINE HCL 25 MG PO CAPS
50.0000 mg | ORAL_CAPSULE | Freq: Once | ORAL | Status: AC
  Administered 2011-11-18: 50 mg via ORAL

## 2011-11-18 MED ORDER — SODIUM CHLORIDE 0.9 % IV SOLN: 375.0000 mg/m2 | Freq: Once | INTRAVENOUS | Status: DC

## 2011-11-18 NOTE — Progress Notes (Signed)
[facility] OFFICE PROGRESS NOTE  DIAGNOSIS:  Stage III, grade 1 non-Hodgkin's B-cell lymphoma (best described as follicular lymphoma); FLIPI score high risk (age >45; stage III; more than 4 nodal involvements)  PAST THERAPY:  Bendamustine/Rituxan days 1 and 2 out of q28 days between 06/16/10 and 11/09/2010 from which she achieved complete remission.  CURRENT THERAPY:  started maintenance Rituxan q32month x 2 years in Feb 2012.  INTERVAL HISTORY: Lauren Maxwell 77 y.o. female returns for regular follow up.  She hopes that maintenance therapy would finish up soon.  She however denies depression, feeling hopelessness.  She denies fatigue, anorexia, weight loss, palpable node swelling, night sweat, infection symptoms.   Patient denies fatigue, headache, visual changes, confusion, mucositis, odynophagia, dysphagia, nausea vomiting, jaundice, chest pain, palpitation, shortness of breath, dyspnea on exertion, productive cough, gum bleeding, epistaxis, hematemesis, hemoptysis, abdominal pain, abdominal swelling, early satiety, melena, hematochezia, hematuria, skin rash, spontaneous bleeding, joint swelling, joint pain, heat or cold intolerance, bowel bladder incontinence, back pain, focal motor weakness, paresthesia, depression, suicidal or homocidal ideation, feeling hopelessness.   MEDICAL HISTORY: Past Medical History  Diagnosis Date  . Follicular lymphoma   . Smoking     SURGICAL HISTORY: No past surgical history on file.  MEDICATIONS: Current Outpatient Prescriptions  Medication Sig Dispense Refill  . acetaminophen (TYLENOL) 325 MG tablet Take 650 mg by mouth every 6 (six) hours as needed.        Marland Kitchen aspirin 81 MG tablet Take 81 mg by mouth daily.        Marland Kitchen lisinopril (PRINIVIL,ZESTRIL) 20 MG tablet Take 20 mg by mouth 2 (two) times daily.       . Multiple Vitamin (MULTIVITAMIN) tablet Take 1 tablet by mouth daily.        . sodium chloride (OCEAN) 0.65 % nasal spray Place 1  spray into the nose as needed.        . tiotropium (SPIRIVA) 18 MCG inhalation capsule Place 18 mcg into inhaler and inhale daily.        . ondansetron (ZOFRAN) 4 MG tablet Take 4 mg by mouth every 12 (twelve) hours as needed.        . prochlorperazine (COMPAZINE) 10 MG tablet Take 10 mg by mouth every 6 (six) hours as needed.         No current facility-administered medications for this visit.   Facility-Administered Medications Ordered in Other Visits  Medication Dose Route Frequency Provider Last Rate Last Dose  . 0.9 %  sodium chloride infusion   Intravenous Once Jethro Bolus, MD      . acetaminophen (TYLENOL) tablet 650 mg  650 mg Oral Once Jethro Bolus, MD   650 mg at 11/18/11 1001  . diphenhydrAMINE (BENADRYL) capsule 50 mg  50 mg Oral Once Jethro Bolus, MD   50 mg at 11/18/11 1002  . riTUXimab (RITUXAN) 600 mg in sodium chloride 0.9 % 190 mL chemo infusion  375 mg/m2 Intravenous Once Jethro Bolus, MD   600 mg at 11/18/11 1110  . DISCONTD: riTUXimab (RITUXAN) 600 mg in sodium chloride 0.9 % 250 mL chemo infusion  375 mg/m2 (Treatment Plan Actual) Intravenous Once Jethro Bolus, MD        ALLERGIES:  is allergic to codeine.  REVIEW OF SYSTEMS:  The rest of the 14-point review of system was negative.   Filed Vitals:   11/18/11 0855  BP: 153/83  Pulse: 86  Temp: 97.3 F (36.3 C)   Wt Readings from  Last 3 Encounters:  11/18/11 115 lb (52.164 kg)  11/18/11 115 lb 14.4 oz (52.572 kg)  09/15/11 114 lb 11.2 oz (52.028 kg)   ECOG Performance status: 0  PHYSICAL EXAMINATION:   General:  well-nourished in no acute distress.  Eyes:  no scleral icterus.  ENT:  There were no oropharyngeal lesions.  Neck was without thyromegaly.  Lymphatics:  Negative cervical, supraclavicular or axillary adenopathy.  Respiratory: lungs were clear bilaterally without wheezing or crackles.  Cardiovascular:  Regular rate and rhythm, S1/S2, without murmur, rub or gallop.  There was no pedal edema.  GI:  abdomen was soft, flat,  nontender, nondistended, without organomegaly.  Muscoloskeletal:  no spinal tenderness of palpation of vertebral spine.  Skin exam was without echymosis, petichae.  Neuro exam was nonfocal.  Patient was able to get on and off exam table without assistance.  Gait was normal.  Patient was alerted and oriented.  Attention was good.   Language was appropriate.  Mood was normal without depression.  Speech was not pressured.  Thought content was not tangential.    LABORATORY/RADIOLOGY DATA:  Lab Results  Component Value Date   WBC 6.4 11/17/2011   HGB 15.9 11/17/2011   HCT 44.6 11/17/2011   PLT 204 11/17/2011   GLUCOSE 98 11/17/2011   CHOL 156 08/10/2010   TRIG 77 08/10/2010   HDL 67 08/10/2010   LDLCALC 74 08/10/2010   ALT 19 09/15/2011   AST 31 11/17/2011   NA 133 11/17/2011   K 4.2 11/17/2011   CL 96* 11/17/2011   CREATININE 0.7 11/17/2011   BUN 13 11/17/2011   CO2 28 11/17/2011   INR 0.93 06/03/2010   IMAGING:    I personally reviewed the following CT chest/abd/pel and showed the patient the images and a copy of the report.  In brief, there was no evidence of recurrent lymphoma.    Ct Chest W Contrast  11/18/2011  **ADDENDUM** CREATED: 11/18/2011 09:23:34  Addendum requested regarding the stability of previously demonstrated ascending aortic dilatation.  The ascending aorta appears stable with mild diffuse ectasia.  Maximal diameter is 4.0 cm, unchanged.  There is no focal aneurysm.  **END ADDENDUM** SIGNED BY: Gerrianne Scale, M.D.   11/18/2011  *RADIOLOGY REPORT*  Clinical Data:  Follow-up non-Hodgkins lymphoma.  The patient is undergoing maintenance chemotherapy.  CT CHEST, ABDOMEN AND PELVIS WITH CONTRAST  Technique:  Multidetector CT imaging of the chest, abdomen and pelvis was performed following the standard protocol during bolus administration of intravenous contrast.  Contrast: OMNIPAQUE IOHEXOL 300 MG/ML IV SOLN  Comparison:  Prior examinations 05/13/2011 and PET CT  12/08/2010.  CT CHEST  Findings:  Minimal asymmetric right parasternal soft tissue density appears unchanged.  There is no discrete mass or enlarged internal mammary lymph node.  There is no axillary, mediastinal or hilar lymphadenopathy.  There are stable postsurgical changes status post resection of the right thyroid lobe.  Scattered atherosclerosis appears stable.  There is no pleural or pericardial effusion.  Mild emphysematous changes are stable. There are no suspicious pulmonary findings.  Mild upper thoracic compression deformities are unchanged.  There are no suspicious osseous findings.  IMPRESSION: Stable CT of the chest.  No evidence of recurrent disease.  CT ABDOMEN AND PELVIS  Findings:  There is stable dependent high density within the gallbladder lumen which may reflect sludge or small gallstones. There is no generalized gallbladder wall thickening.  The liver, biliary system and pancreas otherwise appear unremarkable.  The spleen is  normal in size without focal abnormality.  The adrenal glands and kidneys appear normal.  Soft tissue stranding within the base of the mesentery is stable, attributed to treated mesenteric disease.  There is no discrete adenopathy.  Bowel gas pattern is normal without surrounding inflammation.  The appendix appears normal.  The uterus, ovaries and urinary bladder appear normal.  Scattered atherosclerosis is stable.  Inferior endplate Schmorl's nodes at L2 and L4 are stable.  There is a stable anterolisthesis of L5 on S1 which appears to be secondary to a right-sided pars defect.  No acute or suspicious osseous findings are identified.  IMPRESSION:  1.  No evidence of recurrent lymphoma within the abdomen or pelvis. 2.  Stable soft tissue stranding in the base of the mesentery attributed to treated disease. 3.  Stable osseous findings including L5-S1 anterolisthesis, appearing to result from a right-sided L5 pars defect. Original Report Authenticated By: Gerrianne Scale,  M.D.   Ct Abdomen Pelvis W Contrast  11/18/2011  **ADDENDUM** CREATED: 11/18/2011 09:23:34  Addendum requested regarding the stability of previously demonstrated ascending aortic dilatation.  The ascending aorta appears stable with mild diffuse ectasia.  Maximal diameter is 4.0 cm, unchanged.  There is no focal aneurysm.  **END ADDENDUM** SIGNED BY: Gerrianne Scale, M.D.   11/18/2011  *RADIOLOGY REPORT*  Clinical Data:  Follow-up non-Hodgkins lymphoma.  The patient is undergoing maintenance chemotherapy.  CT CHEST, ABDOMEN AND PELVIS WITH CONTRAST  Technique:  Multidetector CT imaging of the chest, abdomen and pelvis was performed following the standard protocol during bolus administration of intravenous contrast.  Contrast: OMNIPAQUE IOHEXOL 300 MG/ML IV SOLN  Comparison:  Prior examinations 05/13/2011 and PET CT 12/08/2010.  CT CHEST  Findings:  Minimal asymmetric right parasternal soft tissue density appears unchanged.  There is no discrete mass or enlarged internal mammary lymph node.  There is no axillary, mediastinal or hilar lymphadenopathy.  There are stable postsurgical changes status post resection of the right thyroid lobe.  Scattered atherosclerosis appears stable.  There is no pleural or pericardial effusion.  Mild emphysematous changes are stable. There are no suspicious pulmonary findings.  Mild upper thoracic compression deformities are unchanged.  There are no suspicious osseous findings.  IMPRESSION: Stable CT of the chest.  No evidence of recurrent disease.  CT ABDOMEN AND PELVIS  Findings:  There is stable dependent high density within the gallbladder lumen which may reflect sludge or small gallstones. There is no generalized gallbladder wall thickening.  The liver, biliary system and pancreas otherwise appear unremarkable.  The spleen is normal in size without focal abnormality.  The adrenal glands and kidneys appear normal.  Soft tissue stranding within the base of the mesentery  is stable, attributed to treated mesenteric disease.  There is no discrete adenopathy.  Bowel gas pattern is normal without surrounding inflammation.  The appendix appears normal.  The uterus, ovaries and urinary bladder appear normal.  Scattered atherosclerosis is stable.  Inferior endplate Schmorl's nodes at L2 and L4 are stable.  There is a stable anterolisthesis of L5 on S1 which appears to be secondary to a right-sided pars defect.  No acute or suspicious osseous findings are identified.  IMPRESSION:  1.  No evidence of recurrent lymphoma within the abdomen or pelvis. 2.  Stable soft tissue stranding in the base of the mesentery attributed to treated disease. 3.  Stable osseous findings including L5-S1 anterolisthesis, appearing to result from a right-sided L5 pars defect. Original Report Authenticated By: Kimberlee Nearing.  Purcell Mouton, M.D.    ASSESSMENT AND PLAN:   1. Non-Hodgkin lymphoma.  I discussed with Ms. Bondarenko that on clinical history, physical exam, laboratory tests, and CT, there is no concern for recurrent disease.  She is tolerating Rituxan well without major problems.  I advised her to proceed with dose of Rituxan today.  Even though she hopes that her therapy would be over soon, she denies depression or side effects of Rituxan.  2. She thinks that she is up-to-date with mammogram and Pap smear.  She has never had a routine colonoscopy.  She would like to defer this with Dr. Ewing Schlein until she is done with maintenance Rituxan after Feb 2014.   3. Smoking.  Still smoking.  Not willing to give up.  Will again address this every visit.  4. History of aortic aneurysm.  Stable size on this CT.   5. Hypertension.  I strongly advised to her to discuss with her PCP to see if she can get additional antihypertensive med beside Lisinopril to help control her blood pressure to also control her aortic aneurysm.   6. Anxiety, depression:  her mood was stable today. 7. Follow up with me in 2 months before the next  dose of Rituxan.

## 2011-11-18 NOTE — Telephone Encounter (Signed)
Pt set for 01/13/2012 for labd md and rx.aom  Schedule printed and taken to pt in rx/aom

## 2011-11-18 NOTE — Patient Instructions (Signed)
11/18/11- 1230- Pt discharged ambulatory with next appointment confirmed.  Pt aware to call with any questions or concerns.

## 2011-11-23 ENCOUNTER — Other Ambulatory Visit: Payer: Self-pay | Admitting: Oncology

## 2011-11-23 DIAGNOSIS — C8290 Follicular lymphoma, unspecified, unspecified site: Secondary | ICD-10-CM

## 2011-11-25 ENCOUNTER — Telehealth: Payer: Self-pay | Admitting: Oncology

## 2011-11-25 ENCOUNTER — Other Ambulatory Visit: Payer: Self-pay | Admitting: Oncology

## 2011-11-25 DIAGNOSIS — C8290 Follicular lymphoma, unspecified, unspecified site: Secondary | ICD-10-CM

## 2011-11-25 NOTE — Telephone Encounter (Signed)
S/w tina in ir re order for port placement to be done in 8wks per 12/14 elec pof. Inetta Fermo will call pt w/appt. Pt already has feb schedule.

## 2011-11-25 NOTE — Progress Notes (Signed)
Dr.  Gaylyn Rong has order for patient to have port placement within 6 weeks; received call from patient that she she is 'unsure' if she wants to have the port placed; Inetta Fermo in IR is placing order on 'hold' until patient decides.

## 2011-12-13 ENCOUNTER — Ambulatory Visit: Payer: Medicare Other

## 2011-12-19 ENCOUNTER — Telehealth: Payer: Self-pay | Admitting: *Deleted

## 2011-12-19 MED ORDER — LIDOCAINE-PRILOCAINE 2.5-2.5 % EX CREA: TOPICAL_CREAM | CUTANEOUS | Status: AC | PRN

## 2011-12-19 NOTE — Telephone Encounter (Signed)
Pt called to report she is getting porta cath placement on 01/06/12 and then chemo on 01/812 and asks if this is ok?  Assured pt this is ok.  Also instructed her on using EMLA cream on pac prior to needle stick and called in EMLA to her drug store.  Instructed pt to call back if any questions or concerns.  She verbalized understanding.

## 2011-12-28 ENCOUNTER — Other Ambulatory Visit: Payer: Self-pay | Admitting: Radiology

## 2011-12-29 ENCOUNTER — Encounter (HOSPITAL_COMMUNITY): Payer: Self-pay

## 2012-01-02 ENCOUNTER — Other Ambulatory Visit: Payer: Self-pay | Admitting: Radiology

## 2012-01-06 ENCOUNTER — Other Ambulatory Visit: Payer: Self-pay | Admitting: Oncology

## 2012-01-06 ENCOUNTER — Ambulatory Visit (HOSPITAL_COMMUNITY)
Admission: RE | Admit: 2012-01-06 | Discharge: 2012-01-06 | Disposition: A | Discharge status: Home / Self Care | Payer: Medicare Other | Source: Ambulatory Visit | Attending: Oncology | Admitting: Oncology

## 2012-01-06 ENCOUNTER — Encounter (HOSPITAL_COMMUNITY): Payer: Self-pay

## 2012-01-06 DIAGNOSIS — C8290 Follicular lymphoma, unspecified, unspecified site: Secondary | ICD-10-CM

## 2012-01-06 DIAGNOSIS — C8589 Other specified types of non-Hodgkin lymphoma, extranodal and solid organ sites: Secondary | ICD-10-CM | POA: Insufficient documentation

## 2012-01-06 DIAGNOSIS — Z79899 Other long term (current) drug therapy: Secondary | ICD-10-CM | POA: Insufficient documentation

## 2012-01-06 HISTORY — DX: Essential (primary) hypertension: I10

## 2012-01-06 HISTORY — DX: Anxiety disorder, unspecified: F41.9

## 2012-01-06 HISTORY — DX: Depression, unspecified: F32.A

## 2012-01-06 HISTORY — DX: Thoracic aortic aneurysm, without rupture: I71.2

## 2012-01-06 HISTORY — DX: Aneurysm of the ascending aorta, without rupture: I71.21

## 2012-01-06 HISTORY — DX: Major depressive disorder, single episode, unspecified: F32.9

## 2012-01-06 LAB — PROTIME-INR: INR: 0.79 (ref 0.00–1.49)

## 2012-01-06 LAB — CBC
HCT: 44.1 % (ref 36.0–46.0)
Hemoglobin: 16.1 g/dL — ABNORMAL HIGH (ref 12.0–15.0)
RDW: 12.4 % (ref 11.5–15.5)
WBC: 2.9 10*3/uL — ABNORMAL LOW (ref 4.0–10.5)

## 2012-01-06 LAB — BASIC METABOLIC PANEL
Chloride: 98 mEq/L (ref 96–112)
GFR calc Af Amer: >90 mL/min (ref >90)
Potassium: 3.9 mEq/L (ref 3.5–5.1)
Sodium: 134 mEq/L — ABNORMAL LOW (ref 135–145)

## 2012-01-06 LAB — APTT: aPTT: 30 seconds (ref 24–37)

## 2012-01-06 MED ORDER — CEFAZOLIN SODIUM 1-5 GM-% IV SOLN
1.0000 g | INTRAVENOUS | Status: AC
  Administered 2012-01-06: 1 g via INTRAVENOUS
  Filled 2012-01-06: qty 50

## 2012-01-06 MED ORDER — LIDOCAINE HCL 1 % IJ SOLN
INTRAMUSCULAR | Status: AC
  Filled 2012-01-06: qty 20

## 2012-01-06 MED ORDER — FENTANYL CITRATE 0.05 MG/ML IJ SOLN
INTRAMUSCULAR | Status: AC
  Filled 2012-01-06: qty 2

## 2012-01-06 MED ORDER — FENTANYL CITRATE 0.05 MG/ML IJ SOLN
INTRAMUSCULAR | Status: AC
  Filled 2012-01-06: qty 6

## 2012-01-06 MED ORDER — MIDAZOLAM HCL 2 MG/2ML IJ SOLN
INTRAMUSCULAR | Status: AC
  Filled 2012-01-06: qty 2

## 2012-01-06 MED ORDER — MIDAZOLAM HCL 5 MG/5ML IJ SOLN
INTRAMUSCULAR | Status: AC | PRN
  Administered 2012-01-06: 1 mg via INTRAVENOUS
  Administered 2012-01-06: 2 mg via INTRAVENOUS

## 2012-01-06 MED ORDER — FENTANYL CITRATE 0.05 MG/ML IJ SOLN
INTRAMUSCULAR | Status: AC | PRN
  Administered 2012-01-06: 100 ug via INTRAVENOUS
  Administered 2012-01-06: 50 ug via INTRAVENOUS

## 2012-01-06 MED ORDER — SODIUM CHLORIDE 0.9 % IV SOLN
INTRAVENOUS | Status: DC
  Administered 2012-01-06: 11:00:00 via INTRAVENOUS

## 2012-01-06 MED ORDER — MIDAZOLAM HCL 2 MG/2ML IJ SOLN
INTRAMUSCULAR | Status: AC
  Filled 2012-01-06: qty 4

## 2012-01-06 NOTE — H&P (Signed)
Lauren Maxwell is an 78 y.o. female.   Chief Complaint: follicular lymphoma in need of PAC for ongoing chemotherapy treatment.  HPI: See note from Dr. Gaylyn Rong below :   [facility] OFFICE PROGRESS NOTE  DIAGNOSIS:  Stage III, grade 1 non-Hodgkin's B-cell lymphoma (best described as follicular lymphoma); FLIPI score high risk (age >56; stage III; more than 4 nodal involvements)  PAST THERAPY:  Bendamustine/Rituxan days 1 and 2 out of q28 days between 06/16/10 and 11/09/2010 from which she achieved complete remission.  CURRENT THERAPY:  started maintenance Rituxan q53month x 2 years in Feb 2012.  INTERVAL HISTORY: Lauren Maxwell 78 y.o. female returns for regular follow up.  She hopes that maintenance therapy would finish up soon.  She however denies depression, feeling hopelessness.  She denies fatigue, anorexia, weight loss, palpable node swelling, night sweat, infection symptoms.   Patient denies fatigue, headache, visual changes, confusion, mucositis, odynophagia, dysphagia, nausea vomiting, jaundice, chest pain, palpitation, shortness of breath, dyspnea on exertion, productive cough, gum bleeding, epistaxis, hematemesis, hemoptysis, abdominal pain, abdominal swelling, early satiety, melena, hematochezia, hematuria, skin rash, spontaneous bleeding, joint swelling, joint pain, heat or cold intolerance, bowel bladder incontinence, back pain, focal motor weakness, paresthesia, depression, suicidal or homocidal ideation, feeling hopelessness.   MEDICAL HISTORY: Past Medical History   Diagnosis  Date   .  Follicular lymphoma     .  Smoking       SURGICAL HISTORY: No past surgical history on file.  MEDICATIONS: Current Outpatient Prescriptions   Medication  Sig  Dispense  Refill   .  acetaminophen (TYLENOL) 325 MG tablet  Take 650 mg by mouth every 6 (six) hours as needed.           Marland Kitchen  aspirin 81 MG tablet  Take 81 mg by mouth daily.           Marland Kitchen  lisinopril  (PRINIVIL,ZESTRIL) 20 MG tablet  Take 20 mg by mouth 2 (two) times daily.          .  Multiple Vitamin (MULTIVITAMIN) tablet  Take 1 tablet by mouth daily.           .  sodium chloride (OCEAN) 0.65 % nasal spray  Place 1 spray into the nose as needed.           .  tiotropium (SPIRIVA) 18 MCG inhalation capsule  Place 18 mcg into inhaler and inhale daily.           .  ondansetron (ZOFRAN) 4 MG tablet  Take 4 mg by mouth every 12 (twelve) hours as needed.           .  prochlorperazine (COMPAZINE) 10 MG tablet  Take 10 mg by mouth every 6 (six) hours as needed.              No current facility-administered medications for this visit.      Facility-Administered Medications Ordered in Other Visits   Medication  Dose  Route  Frequency  Provider  Last Rate  Last Dose   .  0.9 %  sodium chloride infusion     Intravenous  Once  Jethro Bolus, MD         .  acetaminophen (TYLENOL) tablet 650 mg   650 mg  Oral  Once  Jethro Bolus, MD     650 mg at 11/18/11 1001   .  diphenhydrAMINE (BENADRYL) capsule 50 mg   50 mg  Oral  Once  Jethro Bolus, MD     50 mg at 11/18/11 1002   .  riTUXimab (RITUXAN) 600 mg in sodium chloride 0.9 % 190 mL chemo infusion   375 mg/m2  Intravenous  Once  Jethro Bolus, MD     600 mg at 11/18/11 1110   .  DISCONTD: riTUXimab (RITUXAN) 600 mg in sodium chloride 0.9 % 250 mL chemo infusion   375 mg/m2 (Treatment Plan Actual)  Intravenous  Once  Jethro Bolus, MD           ALLERGIES:  is allergic to codeine.  REVIEW OF SYSTEMS:  The rest of the 14-point review of system was negative.     Filed Vitals:     11/18/11 0855   BP:  153/83   Pulse:  86   Temp:  97.3 F (36.3 C)      Wt Readings from Last 3 Encounters:   11/18/11  115 lb (52.164 kg)   11/18/11  115 lb 14.4 oz (52.572 kg)   09/15/11  114 lb 11.2 oz (52.028 kg)    ECOG Performance status: 0  PHYSICAL EXAMINATION:    General:  well-nourished in no acute distress.  Eyes:  no scleral icterus.  ENT:  There were no oropharyngeal lesions.  Neck  was without thyromegaly.  Lymphatics:  Negative cervical, supraclavicular or axillary adenopathy.  Respiratory: lungs were clear bilaterally without wheezing or crackles.  Cardiovascular:  Regular rate and rhythm, S1/S2, without murmur, rub or gallop.  There was no pedal edema.  GI:  abdomen was soft, flat, nontender, nondistended, without organomegaly.  Muscoloskeletal:  no spinal tenderness of palpation of vertebral spine.  Skin exam was without echymosis, petichae.  Neuro exam was nonfocal.  Patient was able to get on and off exam table without assistance.  Gait was normal.  Patient was alerted and oriented.  Attention was good.   Language was appropriate.  Mood was normal without depression.  Speech was not pressured.  Thought content was not tangential.    LABORATORY/RADIOLOGY DATA:    Lab Results   Component  Value  Date     WBC  6.4  11/17/2011     HGB  15.9  11/17/2011     HCT  44.6  11/17/2011     PLT  204  11/17/2011     GLUCOSE  98  11/17/2011     CHOL  156  08/10/2010     TRIG  77  08/10/2010     HDL  67  08/10/2010     LDLCALC  74  08/10/2010     ALT  19  09/15/2011     AST  31  11/17/2011     NA  133  11/17/2011     K  4.2  11/17/2011     CL  96*  11/17/2011     CREATININE  0.7  11/17/2011     BUN  13  11/17/2011     CO2  28  11/17/2011     INR  0.93  06/03/2010    IMAGING:    I personally reviewed the following CT chest/abd/pel and showed the patient the images and a copy of the report.  In brief, there was no evidence of recurrent lymphoma.    Ct Chest W Contrast  11/18/2011  **ADDENDUM** CREATED: 11/18/2011 09:23:34  Addendum requested regarding the stability of previously demonstrated ascending aortic dilatation.  The ascending aorta appears stable with mild diffuse ectasia.  Maximal diameter is 4.0  cm, unchanged.  There is no focal aneurysm.  **END ADDENDUM** SIGNED BY: Gerrianne Scale, M.D.   11/18/2011  *RADIOLOGY REPORT*  Clinical Data:  Follow-up non-Hodgkins  lymphoma.  The patient is undergoing maintenance chemotherapy.  CT CHEST, ABDOMEN AND PELVIS WITH CONTRAST  Technique:  Multidetector CT imaging of the chest, abdomen and pelvis was performed following the standard protocol during bolus administration of intravenous contrast.  Contrast: OMNIPAQUE IOHEXOL 300 MG/ML IV SOLN  Comparison:  Prior examinations 05/13/2011 and PET CT 12/08/2010.  CT CHEST  Findings:  Minimal asymmetric right parasternal soft tissue density appears unchanged.  There is no discrete mass or enlarged internal mammary lymph node.  There is no axillary, mediastinal or hilar lymphadenopathy.  There are stable postsurgical changes status post resection of the right thyroid lobe.  Scattered atherosclerosis appears stable.  There is no pleural or pericardial effusion.  Mild emphysematous changes are stable. There are no suspicious pulmonary findings.  Mild upper thoracic compression deformities are unchanged.  There are no suspicious osseous findings.  IMPRESSION: Stable CT of the chest.  No evidence of recurrent disease.  CT ABDOMEN AND PELVIS  Findings:  There is stable dependent high density within the gallbladder lumen which may reflect sludge or small gallstones. There is no generalized gallbladder wall thickening.  The liver, biliary system and pancreas otherwise appear unremarkable.  The spleen is normal in size without focal abnormality.  The adrenal glands and kidneys appear normal.  Soft tissue stranding within the base of the mesentery is stable, attributed to treated mesenteric disease.  There is no discrete adenopathy.  Bowel gas pattern is normal without surrounding inflammation.  The appendix appears normal.  The uterus, ovaries and urinary bladder appear normal.  Scattered atherosclerosis is stable.  Inferior endplate Schmorl's nodes at L2 and L4 are stable.  There is a stable anterolisthesis of L5 on S1 which appears to be secondary to a right-sided pars defect.  No acute or  suspicious osseous findings are identified.  IMPRESSION:  1.  No evidence of recurrent lymphoma within the abdomen or pelvis. 2.  Stable soft tissue stranding in the base of the mesentery attributed to treated disease. 3.  Stable osseous findings including L5-S1 anterolisthesis, appearing to result from a right-sided L5 pars defect. Original Report Authenticated By: Gerrianne Scale, M.D.   Ct Abdomen Pelvis W Contrast  11/18/2011  **ADDENDUM** CREATED: 11/18/2011 09:23:34  Addendum requested regarding the stability of previously demonstrated ascending aortic dilatation.  The ascending aorta appears stable with mild diffuse ectasia.  Maximal diameter is 4.0 cm, unchanged.  There is no focal aneurysm.  **END ADDENDUM** SIGNED BY: Gerrianne Scale, M.D.   11/18/2011  *RADIOLOGY REPORT*  Clinical Data:  Follow-up non-Hodgkins lymphoma.  The patient is undergoing maintenance chemotherapy.  CT CHEST, ABDOMEN AND PELVIS WITH CONTRAST  Technique:  Multidetector CT imaging of the chest, abdomen and pelvis was performed following the standard protocol during bolus administration of intravenous contrast.  Contrast: OMNIPAQUE IOHEXOL 300 MG/ML IV SOLN  Comparison:  Prior examinations 05/13/2011 and PET CT 12/08/2010.  CT CHEST  Findings:  Minimal asymmetric right parasternal soft tissue density appears unchanged.  There is no discrete mass or enlarged internal mammary lymph node.  There is no axillary, mediastinal or hilar lymphadenopathy.  There are stable postsurgical changes status post resection of the right thyroid lobe.  Scattered atherosclerosis appears stable.  There is no pleural or pericardial effusion.  Mild emphysematous changes are stable. There are  no suspicious pulmonary findings.  Mild upper thoracic compression deformities are unchanged.  There are no suspicious osseous findings.  IMPRESSION: Stable CT of the chest.  No evidence of recurrent disease.  CT ABDOMEN AND PELVIS  Findings:  There is  stable dependent high density within the gallbladder lumen which may reflect sludge or small gallstones. There is no generalized gallbladder wall thickening.  The liver, biliary system and pancreas otherwise appear unremarkable.  The spleen is normal in size without focal abnormality.  The adrenal glands and kidneys appear normal.  Soft tissue stranding within the base of the mesentery is stable, attributed to treated mesenteric disease.  There is no discrete adenopathy.  Bowel gas pattern is normal without surrounding inflammation.  The appendix appears normal.  The uterus, ovaries and urinary bladder appear normal.  Scattered atherosclerosis is stable.  Inferior endplate Schmorl's nodes at L2 and L4 are stable.  There is a stable anterolisthesis of L5 on S1 which appears to be secondary to a right-sided pars defect.  No acute or suspicious osseous findings are identified.  IMPRESSION:  1.  No evidence of recurrent lymphoma within the abdomen or pelvis. 2.  Stable soft tissue stranding in the base of the mesentery attributed to treated disease. 3.  Stable osseous findings including L5-S1 anterolisthesis, appearing to result from a right-sided L5 pars defect. Original Report Authenticated By: Gerrianne Scale, M.D.     ASSESSMENT AND PLAN:    1. Non-Hodgkin lymphoma.  I discussed with Ms. Straughter that on clinical history, physical exam, laboratory tests, and CT, there is no concern for recurrent disease.  She is tolerating Rituxan well without major problems.  I advised her to proceed with dose of Rituxan today.  Even though she hopes that her therapy would be over soon, she denies depression or side effects of Rituxan.    2. She thinks that she is up-to-date with mammogram and Pap smear.  She has never had a routine colonoscopy.  She would like to defer this with Dr. Ewing Schlein until she is done with maintenance Rituxan after Feb 2014.     3. Smoking. Still smoking.  Not willing to give up.  Will again address  this every visit.  4. History of aortic aneurysm.  Stable size on this CT.   5. Hypertension.  I strongly advised to her to discuss with her PCP to see if she can get additional antihypertensive med beside Lisinopril to help control her blood pressure to also control her aortic aneurysm.   6. Anxiety, depression:  her mood was stable today.  7. Follow up with me in 2 months before the next dose of Rituxan.      Today's examination and findings update in epic system :   01/06/12  Past Medical History  Diagnosis Date  . Follicular lymphoma   . Smoking   . Hypertension   . Anxiety and depression   . Ascending aortic aneurysm     Past Surgical History  Procedure Date  . Tonsillectomy   . Colonoscopy   . Tubal ligation   . Thyroid nodule excision    Social History:  does not have a smoking history on file. She does not have any smokeless tobacco history on file. Her alcohol and drug histories not on file.  Allergies:  Allergies  Allergen Reactions  . Codeine Nausea And Vomiting    Medications Prior to Admission  Medication Sig Dispense Refill  . aspirin 81 MG tablet Take 81 mg by  mouth daily.        Marland Kitchen lisinopril (PRINIVIL,ZESTRIL) 20 MG tablet Take 20 mg by mouth 2 (two) times daily.       . Multiple Vitamin (MULTIVITAMIN) tablet Take 1 tablet by mouth daily. Centrum Silver       . sodium chloride (OCEAN) 0.65 % nasal spray Place 1 spray into the nose as needed. For nasal congestion.      Marland Kitchen tiotropium (SPIRIVA) 18 MCG inhalation capsule Place 18 mcg into inhaler and inhale daily.        Marland Kitchen lidocaine-prilocaine (EMLA) cream Apply topically as needed. Apply to [facility] site one hour prior to needle access as needed.  30 g  2  . ondansetron (ZOFRAN) 4 MG tablet Take 4 mg by mouth every 12 (twelve) hours as needed. For nausea.      . prochlorperazine (COMPAZINE) 10 MG tablet Take 10 mg by mouth every 6 (six) hours as needed. For nausea.       Medications Prior to Admission    Medication Dose Route Frequency Provider Last Rate Last Dose  . 0.9 %  sodium chloride infusion   Intravenous Continuous Robet Leu, PA      . ceFAZolin (ANCEF) IVPB 1 g/50 mL premix  1 g Intravenous to XRAY Robet Leu, PA        Results for orders placed during the hospital encounter of 01/06/12 (from the past 48 hour(s))  CBC     Status: Abnormal   Collection Time   01/06/12  8:57 AM      Component Value Range Comment   WBC 2.9 (*) 4.0 - 10.5 (K/uL)    RBC 4.66  3.87 - 5.11 (MIL/uL)    Hemoglobin 16.1 (*) 12.0 - 15.0 (g/dL)    HCT 16.1  09.6 - 04.5 (%)    MCV 94.6  78.0 - 100.0 (fL)    MCH 34.5 (*) 26.0 - 34.0 (pg)    MCHC 36.5 (*) 30.0 - 36.0 (g/dL)    RDW 40.9  81.1 - 91.4 (%)    Platelets 159  150 - 400 (K/uL)    Review of Systems  Constitutional: Negative for fever and chills.  HENT: Negative.   Eyes: Negative.   Respiratory: Negative.  Negative for cough and sputum production.   Cardiovascular: Negative.  Negative for chest pain, palpitations and leg swelling.  Gastrointestinal: Negative.   Genitourinary: Negative.   Musculoskeletal: Negative.   Skin: Negative.   Neurological: Negative.  Negative for weakness.  Psychiatric/Behavioral: Positive for depression. The patient is nervous/anxious.     Blood pressure 140/80, pulse 101, temperature 98.1 F (36.7 C), SpO2 92.00%. Physical Exam  Constitutional: She is oriented to person, place, and time. She appears well-developed and well-nourished.  HENT:  Head: Normocephalic and atraumatic.  Neck:       Surgical scar noted at thyroid   Cardiovascular: Normal rate, regular rhythm and normal heart sounds.  Exam reveals no gallop and no friction rub.   No murmur heard. Respiratory: Effort normal and breath sounds normal. She has no wheezes. She has no rales.  GI: Soft.  Musculoskeletal: Normal range of motion.  Neurological: She is alert and oriented to person, place, and time.  Skin: Skin is warm and dry.   Psychiatric: She has a normal mood and affect. Her behavior is normal. Judgment and thought content normal.     Assessment/Plan PAC placement for ongoing chemotherapy.  Discussed in detail the procedure for placement, benefits and potential  risks of bleeding, vessel damage, poorly functioning port, infection and complications with moderate sedation with her apparent understanding.  Written consent obtained.   CAMPBELL,PAMELA D 01/06/2012, 9:22 AM

## 2012-01-06 NOTE — Procedures (Signed)
RIJV PAC tip SVC RA No comp 

## 2012-01-11 ENCOUNTER — Telehealth: Payer: Self-pay | Admitting: *Deleted

## 2012-01-11 NOTE — Telephone Encounter (Signed)
Pt called to report a "hematoma" at new [facility] site.  PAC placed by IR on 01/06/12.  Pt states it is very swollen and she was assessed by nurse at IR yesterday.  States she was told that it would be unlikely any nurse could access pt's [facility] for chemo this Friday 01/13/12 due to the swelling.  She is supposed to check back w/ IR on Friday.   Instructed pt to keep her appt to see Dr. Gaylyn Rong on Friday 01/13/12 and we will assess situation at that time.  She verbalized understanding.

## 2012-01-13 ENCOUNTER — Ambulatory Visit (HOSPITAL_BASED_OUTPATIENT_CLINIC_OR_DEPARTMENT_OTHER): Payer: Medicare Other | Admitting: Oncology

## 2012-01-13 ENCOUNTER — Ambulatory Visit: Payer: Medicare Other

## 2012-01-13 ENCOUNTER — Other Ambulatory Visit: Payer: Medicare Other | Admitting: Lab

## 2012-01-13 ENCOUNTER — Telehealth: Payer: Self-pay | Admitting: Oncology

## 2012-01-13 VITALS — BP 156/93 | HR 91 | Temp 98.9°F | Ht 65.0 in | Wt 112.9 lb

## 2012-01-13 DIAGNOSIS — D696 Thrombocytopenia, unspecified: Secondary | ICD-10-CM

## 2012-01-13 DIAGNOSIS — F17200 Nicotine dependence, unspecified, uncomplicated: Secondary | ICD-10-CM

## 2012-01-13 DIAGNOSIS — F341 Dysthymic disorder: Secondary | ICD-10-CM

## 2012-01-13 DIAGNOSIS — C8299 Follicular lymphoma, unspecified, extranodal and solid organ sites: Secondary | ICD-10-CM

## 2012-01-13 DIAGNOSIS — C8290 Follicular lymphoma, unspecified, unspecified site: Secondary | ICD-10-CM

## 2012-01-13 LAB — CBC WITH DIFFERENTIAL/PLATELET
BASO%: 1.3 % (ref 0.0–2.0)
HCT: 42.1 % (ref 34.8–46.6)
HGB: 14.9 g/dL (ref 11.6–15.9)
MCHC: 35.4 g/dL (ref 31.5–36.0)
MONO#: 1 10*3/uL — ABNORMAL HIGH (ref 0.1–0.9)
NEUT%: 53.3 % (ref 38.4–76.8)
WBC: 4.6 10*3/uL (ref 3.9–10.3)
lymph#: 0.9 10*3/uL (ref 0.9–3.3)

## 2012-01-13 NOTE — Progress Notes (Signed)
Pt's PAC continues to have quite a bit of swelling mostly below the level of her incision.  It would likely be uncomfortable for port to be accessed today for chemo.  Informed pt that the swelling will continue to subside and will likely be easier and more comfortable to access next week.  Notified Dr. Gaylyn Rong and he delayed pts Rituxan to next week.  Pt has historically very poor venous access and therefore the reason for PAC placement.   Pt went to IR to be re-evaluated for the swelling.  Called Tina in IR to inform of pt coming over there.  She will notify their P.A.

## 2012-01-13 NOTE — Telephone Encounter (Signed)
appts made and printed for 2/15 since tx was  Moved from today and 4/4 moved to 4/12 with md visit         aom

## 2012-01-13 NOTE — Progress Notes (Signed)
Lauren Maxwell OFFICE PROGRESS NOTE  DIAGNOSIS:  Stage III, grade 1 non-Hodgkin's B-cell lymphoma (best described as follicular lymphoma); FLIPI score high risk (age >63; stage III; more than 4 nodal involvements)  PAST THERAPY:  Bendamustine/Rituxan days 1 and 2 out of q28 days between 06/16/10 and 11/09/2010 from which she achieved complete remission.  CURRENT THERAPY:  started maintenance Rituxan q47month x 2 years in Feb 2012.  INTERVAL HISTORY:  Lauren Maxwell 78 y.o. female returns for regular follow up.  She reports that her appetite is all right.  She eats normal (for her) amount with breakfast and dinner.  However, she only snacks for lunch.  She had portacath placed this past week with swelling and some erythema.  She has not noticed fever, opened skin, or purulent discharge from the area.    Patient denies fatigue, headache, visual changes, confusion, drenching night sweats, palpable lymph node swelling, mucositis, odynophagia, dysphagia, nausea vomiting, jaundice, chest pain, palpitation, shortness of breath, dyspnea on exertion, productive cough, gum bleeding, epistaxis, hematemesis, hemoptysis, abdominal pain, abdominal swelling, early satiety, melena, hematochezia, hematuria, skin rash, spontaneous bleeding, joint swelling, joint pain, heat or cold intolerance, bowel bladder incontinence, back pain, focal motor weakness, paresthesia, depression, suicidal or homocidal ideation, feeling hopelessness.   MEDICAL HISTORY: Past Medical History  Diagnosis Date  . Follicular lymphoma   . Smoking   . Hypertension   . Anxiety and depression   . Ascending aortic aneurysm     SURGICAL HISTORY:  Past Surgical History  Procedure Date  . Tonsillectomy   . Colonoscopy   . Tubal ligation   . Thyroid nodule excision     MEDICATIONS: Current Outpatient Prescriptions  Medication Sig Dispense Refill  . aspirin 81 MG tablet Take 81 mg by mouth daily.        Marland Kitchen  lidocaine-prilocaine (EMLA) cream Apply topically as needed. Apply to [facility] site one hour prior to needle access as needed.  30 g  2  . lisinopril (PRINIVIL,ZESTRIL) 20 MG tablet Take 20 mg by mouth 2 (two) times daily.       . Multiple Vitamin (MULTIVITAMIN) tablet Take 1 tablet by mouth daily. Centrum Silver       . ondansetron (ZOFRAN) 4 MG tablet Take 4 mg by mouth every 12 (twelve) hours as needed. For nausea.      . prochlorperazine (COMPAZINE) 10 MG tablet Take 10 mg by mouth every 6 (six) hours as needed. For nausea.      . sodium chloride (OCEAN) 0.65 % nasal spray Place 1 spray into the nose as needed. For nasal congestion.      Marland Kitchen tiotropium (SPIRIVA) 18 MCG inhalation capsule Place 18 mcg into inhaler and inhale daily.          ALLERGIES:  is allergic to codeine.  REVIEW OF SYSTEMS:  The rest of the 14-point review of system was negative.   Filed Vitals:   01/13/12 0823  BP: 156/93  Pulse: 91  Temp: 98.9 F (37.2 C)   Wt Readings from Last 3 Encounters:  01/13/12 112 lb 14.4 oz (51.211 kg)  11/18/11 115 lb (52.164 kg)  11/18/11 115 lb 14.4 oz (52.572 kg)   ECOG Performance status: 0  PHYSICAL EXAMINATION:   General:  Thin-appearing woman in no acute distress.  Eyes:  no scleral icterus.  ENT:  There were no oropharyngeal lesions.  Neck was without thyromegaly.  Lymphatics:  Negative cervical, supraclavicular or axillary adenopathy.  Respiratory: lungs were clear  bilaterally without wheezing or crackles.  Cardiovascular:  Regular rate and rhythm, S1/S2, without murmur, rub or gallop.  There was no pedal edema.  GI:  abdomen was soft, flat, nontender, nondistended, without organomegaly.  Muscoloskeletal:  no spinal tenderness of palpation of vertebral spine.  Portacath area showed mild erythema and edema without skin breakthrough or purulent discharge.  Skin exam was without echymosis, petichae.  Neuro exam was nonfocal.  Patient was able to get on and off exam table  without assistance.  Gait was normal.  Patient was alerted and oriented.  Attention was good.   Language was appropriate.  Mood was normal without depression.  Speech was not pressured.  Thought content was not tangential.    LABORATORY/RADIOLOGY DATA:  Lab Results  Component Value Date   WBC 4.6 01/13/2012   HGB 14.9 01/13/2012   HCT 42.1 01/13/2012   PLT 127* 01/13/2012   GLUCOSE 96 01/06/2012   CHOL 156 08/10/2010   TRIG 77 08/10/2010   HDL 67 08/10/2010   LDLCALC 74 08/10/2010   ALT 19 09/15/2011   AST 31 11/17/2011   NA 134* 01/06/2012   K 3.9 01/06/2012   CL 98 01/06/2012   CREATININE 0.68 01/06/2012   BUN 9 01/06/2012   CO2 23 01/06/2012   INR 0.79 01/06/2012    ASSESSMENT AND PLAN:   1. Non-Hodgkin lymphoma:  She is doing well with maintenance Rituxan q38month since 01/2011.  Goal is for 2 years total.  She has not had side effect from this.  There is no evidence of lymphoma recurrence.  I recommended delaying today Rituxan for about one week for the inflammation of her portacath to improve.   2. Age appropriate cancer screening:  She thinks that she is up-to-date with mammogram and Pap smear.  She has never had a routine colonoscopy.  She would like to defer this with Dr. Ewing Schlein until she is done with maintenance Rituxan after Feb 2014.   3. Smoking.  Still smoking. 4. History of aortic aneurysm.  Stable size on this CT.   5. Hypertension.  She is on Lisinopril.  Given her aortic aneurysm, her BP may need to be more tightly controlled.  I defer to her PCP for BP titration as appropriate.    6. Anxiety, depression:  her mood was stable today. 7. Thrombocytopenia:  Most likely due to the recent portacath placement with local bruising/bleeding.  If her thrombocytopenia worsens in the future, I may consider further work up.  8. Follow up with me in 2 months before the next dose of Rituxan.

## 2012-01-13 NOTE — Progress Notes (Signed)
Patient ID: Lauren Maxwell, female   DOB: Mar 22, 1931, 78 y.o.   MRN: [id number]   Pt here today for reevaluation of PAC site PAC was placed 2/1 in IR She was seen 2/5 because of a hematoma that had developed At site.  O:  Site is still somewhat tender but hematoma is resolving. Now approx 2 cm around PAC site ecchymosis still apparent but resolving also. Skin intact No sign of infection or active bleeding  Not to use PAC till next week per Oncology MD.  P: reassurance. Hematoma resolving Becoming less tender Warm compress to area at this point Return if needed

## 2012-01-19 ENCOUNTER — Telehealth: Payer: Self-pay | Admitting: *Deleted

## 2012-01-19 ENCOUNTER — Other Ambulatory Visit: Payer: Self-pay | Admitting: Oncology

## 2012-01-19 ENCOUNTER — Other Ambulatory Visit: Payer: Self-pay | Admitting: *Deleted

## 2012-01-19 NOTE — Telephone Encounter (Signed)
Pt called to confirm chemo appt tomorrow.  Informed her labs not needed tomorrow as they were done last week.  Will cancel lab appt. Pt verbalized understanding.  She reports the swelling on her PAC site has gone down, but still a little sore.  She understands how to use the EMLA cream prior to chemo.

## 2012-01-20 ENCOUNTER — Ambulatory Visit (HOSPITAL_BASED_OUTPATIENT_CLINIC_OR_DEPARTMENT_OTHER): Payer: Medicare Other

## 2012-01-20 ENCOUNTER — Other Ambulatory Visit: Payer: Medicare Other | Admitting: Lab

## 2012-01-20 VITALS — BP 139/78 | HR 76 | Temp 97.9°F

## 2012-01-20 DIAGNOSIS — Z5112 Encounter for antineoplastic immunotherapy: Secondary | ICD-10-CM

## 2012-01-20 DIAGNOSIS — C8290 Follicular lymphoma, unspecified, unspecified site: Secondary | ICD-10-CM

## 2012-01-20 DIAGNOSIS — C8299 Follicular lymphoma, unspecified, extranodal and solid organ sites: Secondary | ICD-10-CM

## 2012-01-20 MED ORDER — SODIUM CHLORIDE 0.9 % IV SOLN
375.0000 mg/m2 | Freq: Once | INTRAVENOUS | Status: AC
  Administered 2012-01-20: 600 mg via INTRAVENOUS
  Filled 2012-01-20: qty 60

## 2012-01-20 MED ORDER — DIPHENHYDRAMINE HCL 25 MG PO CAPS
50.0000 mg | ORAL_CAPSULE | Freq: Once | ORAL | Status: AC
  Administered 2012-01-20: 50 mg via ORAL

## 2012-01-20 MED ORDER — ACETAMINOPHEN 325 MG PO TABS
650.0000 mg | ORAL_TABLET | Freq: Once | ORAL | Status: AC
  Administered 2012-01-20: 650 mg via ORAL

## 2012-01-20 MED ORDER — HEPARIN SOD (PORK) LOCK FLUSH 100 UNIT/ML IV SOLN
500.0000 [IU] | Freq: Once | INTRAVENOUS | Status: DC | PRN
  Filled 2012-01-20: qty 5

## 2012-01-20 MED ORDER — SODIUM CHLORIDE 0.9 % IV SOLN: 375.0000 mg/m2 | Freq: Once | INTRAVENOUS | Status: DC

## 2012-01-20 MED ORDER — SODIUM CHLORIDE 0.9 % IJ SOLN
10.0000 mL | INTRAMUSCULAR | Status: DC | PRN
  Filled 2012-01-20: qty 10

## 2012-01-20 MED ORDER — SODIUM CHLORIDE 0.9 % IV SOLN
Freq: Once | INTRAVENOUS | Status: AC
  Administered 2012-01-20: 10:00:00 via INTRAVENOUS

## 2012-02-15 ENCOUNTER — Telehealth: Payer: Self-pay | Admitting: *Deleted

## 2012-02-15 NOTE — Telephone Encounter (Signed)
Pt reports ongoing loose stools 2 to 3 times per day especially worse in the mornings, but not every day.  Has been going on for 3 to 4 weeks.  She also c/o some increase in gas and indigestion.  Asking for anti diarrheal medication.  Instructed pt to try imodium ad OTC and follow package instructions.  Also instructed to call her PCP if symptoms persist or worsen.  She verbalized understanding.

## 2012-02-29 ENCOUNTER — Telehealth: Payer: Self-pay | Admitting: Oncology

## 2012-02-29 NOTE — Telephone Encounter (Signed)
Return call to patient. Has concerns about Rituxan. She reports diarrhea since ~ Feb 1st when [facility] was placed. Has had stool specimens checked per PCP and negative per her report. Using Imodium with some improvement. Discussed that likelihood of Rituxan causing diarrhea was low. Gave the patient the option of stopping Rituxan. She wants to continue medication and will keep appointment as scheduled. Plans to call Dr Clelia Croft to discuss possible GI referral.

## 2012-03-08 ENCOUNTER — Ambulatory Visit: Payer: Medicare Other

## 2012-03-15 ENCOUNTER — Telehealth: Payer: Self-pay | Admitting: *Deleted

## 2012-03-15 NOTE — Telephone Encounter (Signed)
`  Pt called to inquire when her last cholesterol level was checked and if it is ordered w/ labs tomorrow.  Explained to pt that Dr. Gaylyn Rong does not order cholesterol levels and I cannot find one in the computer.  She says she thinks it is time to have it checked and Dr. Clelia Croft wants it done.  Instructed pt to bring Rx from Dr. Clelia Croft for any additional labs needed.  She verbalized understanding.

## 2012-03-16 ENCOUNTER — Telehealth: Payer: Self-pay | Admitting: Oncology

## 2012-03-16 ENCOUNTER — Other Ambulatory Visit (HOSPITAL_BASED_OUTPATIENT_CLINIC_OR_DEPARTMENT_OTHER): Payer: Medicare Other | Admitting: Lab

## 2012-03-16 ENCOUNTER — Ambulatory Visit (HOSPITAL_BASED_OUTPATIENT_CLINIC_OR_DEPARTMENT_OTHER): Payer: Medicare Other

## 2012-03-16 ENCOUNTER — Ambulatory Visit (HOSPITAL_BASED_OUTPATIENT_CLINIC_OR_DEPARTMENT_OTHER): Payer: Medicare Other | Admitting: Oncology

## 2012-03-16 VITALS — BP 134/74 | HR 63 | Temp 97.6°F

## 2012-03-16 VITALS — BP 121/74 | HR 82 | Temp 97.5°F | Ht 65.0 in | Wt 112.7 lb

## 2012-03-16 DIAGNOSIS — C8290 Follicular lymphoma, unspecified, unspecified site: Secondary | ICD-10-CM

## 2012-03-16 DIAGNOSIS — R197 Diarrhea, unspecified: Secondary | ICD-10-CM

## 2012-03-16 DIAGNOSIS — C8299 Follicular lymphoma, unspecified, extranodal and solid organ sites: Secondary | ICD-10-CM

## 2012-03-16 DIAGNOSIS — D72810 Lymphocytopenia: Secondary | ICD-10-CM

## 2012-03-16 DIAGNOSIS — Z5112 Encounter for antineoplastic immunotherapy: Secondary | ICD-10-CM

## 2012-03-16 LAB — CBC WITH DIFFERENTIAL/PLATELET
Basophils Absolute: 0 10*3/uL (ref 0.0–0.1)
Eosinophils Absolute: 0.2 10*3/uL (ref 0.0–0.5)
HGB: 13.5 g/dL (ref 11.6–15.9)
MCV: 102 fL — ABNORMAL HIGH (ref 79.5–101.0)
MONO#: 0.8 10*3/uL (ref 0.1–0.9)
NEUT#: 9 10*3/uL — ABNORMAL HIGH (ref 1.5–6.5)
RDW: 14.5 % (ref 11.2–14.5)
WBC: 10.8 10*3/uL — ABNORMAL HIGH (ref 3.9–10.3)
lymph#: 0.7 10*3/uL — ABNORMAL LOW (ref 0.9–3.3)

## 2012-03-16 LAB — COMPREHENSIVE METABOLIC PANEL
ALT: 15 U/L (ref 0–35)
Albumin: 3.7 g/dL (ref 3.5–5.2)
CO2: 26 mEq/L (ref 19–32)
Chloride: 103 mEq/L (ref 96–112)
Glucose, Bld: 95 mg/dL (ref 70–99)
Potassium: 4 mEq/L (ref 3.5–5.3)
Sodium: 135 mEq/L (ref 135–145)
Total Protein: 5.6 g/dL — ABNORMAL LOW (ref 6.0–8.3)

## 2012-03-16 LAB — LACTATE DEHYDROGENASE: LDH: 157 U/L (ref 94–250)

## 2012-03-16 MED ORDER — SODIUM CHLORIDE 0.9 % IJ SOLN
3.0000 mL | INTRAMUSCULAR | Status: DC | PRN
  Filled 2012-03-16: qty 10

## 2012-03-16 MED ORDER — SODIUM CHLORIDE 0.9 % IJ SOLN
10.0000 mL | INTRAMUSCULAR | Status: DC | PRN
  Administered 2012-03-16: 10 mL
  Filled 2012-03-16: qty 10

## 2012-03-16 MED ORDER — ACETAMINOPHEN 325 MG PO TABS
650.0000 mg | ORAL_TABLET | Freq: Once | ORAL | Status: AC
  Administered 2012-03-16: 650 mg via ORAL

## 2012-03-16 MED ORDER — HEPARIN SOD (PORK) LOCK FLUSH 100 UNIT/ML IV SOLN
500.0000 [IU] | Freq: Once | INTRAVENOUS | Status: AC | PRN
  Administered 2012-03-16: 500 [IU]
  Filled 2012-03-16: qty 5

## 2012-03-16 MED ORDER — DIPHENHYDRAMINE HCL 25 MG PO CAPS
50.0000 mg | ORAL_CAPSULE | Freq: Once | ORAL | Status: AC
  Administered 2012-03-16: 50 mg via ORAL

## 2012-03-16 MED ORDER — SODIUM CHLORIDE 0.9 % IV SOLN
375.0000 mg/m2 | Freq: Once | INTRAVENOUS | Status: AC
  Administered 2012-03-16: 600 mg via INTRAVENOUS
  Filled 2012-03-16: qty 60

## 2012-03-16 MED ORDER — ALTEPLASE 2 MG IJ SOLR
2.0000 mg | Freq: Once | INTRAMUSCULAR | Status: DC | PRN
  Filled 2012-03-16: qty 2

## 2012-03-16 MED ORDER — SODIUM CHLORIDE 0.9 % IV SOLN: 375.0000 mg/m2 | Freq: Once | INTRAVENOUS | Status: DC

## 2012-03-16 MED ORDER — SODIUM CHLORIDE 0.9 % IV SOLN
Freq: Once | INTRAVENOUS | Status: AC
  Administered 2012-03-16: 10:00:00 via INTRAVENOUS

## 2012-03-16 NOTE — Telephone Encounter (Signed)
pt called and scheduled her appts for june along with ct scan on 06/11 @ [facility]. pt will come by and pick up contrast before she ct scan . will mail appt to her home per pts request

## 2012-03-16 NOTE — Patient Instructions (Addendum)
1.  History of follicular lymphoma: - continue to be in remission. - Continue maintenance Rituxan IV once every 2 months.  Last dose will be in December 2013.  - Next CT scan due in June 2013.   2.  Diarrhea:  Unclear etiology.  Rituxan rarely causes diarrhea (< 20% chance).

## 2012-03-16 NOTE — Progress Notes (Signed)
[facility] OFFICE PROGRESS NOTE  DIAGNOSIS:  Stage III, grade 1 non-Hodgkin's B-cell lymphoma (best described as follicular lymphoma); FLIPI score high risk (age >89; stage III; more than 4 nodal involvements)  PAST THERAPY:  Bendamustine/Rituxan days 1 and 2 out of q28 days between 06/16/10 and 11/09/2010 from which she achieved complete remission.  CURRENT THERAPY:  started maintenance Rituxan q84month x 2 years in Feb 2012.  INTERVAL HISTORY:  Lauren Maxwell 78 y.o. female returns for regular follow up.  She developed diarrhea that lasted for 2 weeks after the last dose of Rituxan 2 months ago.  Diarrhea was "explosive," water, with tenesmus, without hematochezia, melena, not improved with imodium, no relation with type of foods, no fever, abdominal pain.   Since then, she has had loose stool about 3x/day.  With respect to her lymphoma, she denies palpable nodes, fever, night sweat, fatigue.  Patient denies fatigue, headache, visual changes, confusion, drenching night sweats, palpable lymph node swelling, mucositis, odynophagia, dysphagia, nausea vomiting, jaundice, chest pain, palpitation, shortness of breath, dyspnea on exertion, productive cough, gum bleeding, epistaxis, hematemesis, hemoptysis, abdominal pain, abdominal swelling, early satiety, melena, hematochezia, hematuria, skin rash, spontaneous bleeding, joint swelling, joint pain, heat or cold intolerance, bowel bladder incontinence, back pain, focal motor weakness, paresthesia, depression, suicidal or homocidal ideation, feeling hopelessness.    MEDICAL HISTORY: Past Medical History  Diagnosis Date  . Follicular lymphoma   . Smoking   . Hypertension   . Anxiety and depression   . Ascending aortic aneurysm     SURGICAL HISTORY:  Past Surgical History  Procedure Date  . Tonsillectomy   . Colonoscopy   . Tubal ligation   . Thyroid nodule excision     MEDICATIONS: Current Outpatient Prescriptions    Medication Sig Dispense Refill  . aspirin 81 MG tablet Take 81 mg by mouth every other day.       . lidocaine-prilocaine (EMLA) cream Apply topically as needed. Apply to [facility] site one hour prior to needle access as needed.  30 g  2  . lisinopril (PRINIVIL,ZESTRIL) 20 MG tablet Take 20 mg by mouth 2 (two) times daily.       . Multiple Vitamin (MULTIVITAMIN) tablet Take 1 tablet by mouth daily. Centrum Silver       . sodium chloride (OCEAN) 0.65 % nasal spray Place 1 spray into the nose as needed. For nasal congestion.      Marland Kitchen tiotropium (SPIRIVA) 18 MCG inhalation capsule Place 18 mcg into inhaler and inhale daily.          ALLERGIES:  is allergic to codeine.  REVIEW OF SYSTEMS:  The rest of the 14-point review of system was negative.   Filed Vitals:   03/16/12 0831  BP: 121/74  Pulse: 82  Temp: 97.5 F (36.4 C)   Wt Readings from Last 3 Encounters:  03/16/12 112 lb 11.2 oz (51.12 kg)  01/13/12 112 lb 14.4 oz (51.211 kg)  11/18/11 115 lb (52.164 kg)   ECOG Performance status: 0  PHYSICAL EXAMINATION:   General:  Thin-appearing woman in no acute distress.  Eyes:  no scleral icterus.  ENT:  There were no oropharyngeal lesions.  Neck was without thyromegaly.  Lymphatics:  Negative cervical, supraclavicular or axillary adenopathy.  Respiratory: lungs were clear bilaterally without wheezing or crackles.  Cardiovascular:  Regular rate and rhythm, S1/S2, without murmur, rub or gallop.  There was no pedal edema.  GI:  abdomen was soft, flat, nontender, nondistended, without  organomegaly.  Muscoloskeletal:  no spinal tenderness of palpation of vertebral spine.  Portacath area showed mild erythema and edema without skin breakthrough or purulent discharge.  Skin exam was without echymosis, petichae.  Neuro exam was nonfocal.  Patient was able to get on and off exam table without assistance.  Gait was normal.  Patient was alerted and oriented.  Attention was good.   Language was appropriate.   Mood was normal without depression.  Speech was not pressured.  Thought content was not tangential.    LABORATORY/RADIOLOGY DATA:  Lab Results  Component Value Date   WBC 10.8* 03/16/2012   HGB 13.5 03/16/2012   HCT 39.6 03/16/2012   PLT 223 03/16/2012   GLUCOSE 96 01/06/2012   CHOL 156 08/10/2010   TRIG 77 08/10/2010   HDL 67 08/10/2010   LDLCALC 74 08/10/2010   ALT 19 09/15/2011   AST 31 11/17/2011   NA 134* 01/06/2012   K 3.9 01/06/2012   CL 98 01/06/2012   CREATININE 0.68 01/06/2012   BUN 9 01/06/2012   CO2 23 01/06/2012   INR 0.79 01/06/2012    ASSESSMENT AND PLAN:   1. Non-Hodgkin lymphoma:  She is doing well with maintenance Rituxan q106month since 01/2011.  Goal is for 2 years total.  She has had diarrhea which is not a common side effects of Rituxan (<20% chance).  I discussed with patient 3 options:  Go ahead with Rituxan today and if diarrhea worsens, will stop further Rituxan.  2nd option is to hold off on Rituxan until eval by GI.  3rd option is to stop further Rituxan use.  We again discussed the potential benefit of maintenance Rituxan in her disease setting which is to decrease the risk of recurrent disease but does not improve survival.  She would like to proceed with Rituxan today.  Next surveillance CT is due in June 2013.  I ordered CT chest/abd/pel today.  2. Diarrhea:  Ddx:  Rituxan vs IBS vs inflammatory bowel disease vs other causes.  She sees Dr. Ewing Schlein next week.  She is due for colonoscopy anyway.  Rituxan is not chemotherapy; it is antibody against CD20 and is not immunosuppressive. There is no contraindication for colonoscopy and biopsy if Dr. Ewing Schlein thinks that is appropriate step for evaluation of her diarrhea.  3. Smoking.  Still smoking.  She does not want to address this again today.  We will address this again after Rixutan is finished.  4. History of aortic aneurysm.  Stable size on this CT.  We'll follow up on this on next surveillance CT for lymphoma.  5. Hypertension.  She is on  Lisinopril with good control today.  6. Anxiety, depression:  her mood was stable today. 7. Lymphocytopenia without leukocytopenia:  Most likely due to Rituxan.  There is no recurrent infection.  Will continue to observe.  Once Rituxan is finished, it most likely will improve.  8. Follow up with me in 2 months before the next dose of Rituxan.   The length of time of the face-to-face encounter was 25  minutes. More than 50% of time was spent counseling and coordination of care.

## 2012-03-20 NOTE — Progress Notes (Signed)
Received offices notes from Dr. Vida Rigger @ [facility]; forwarded to Dr. Gaylyn Rong.

## 2012-04-09 ENCOUNTER — Telehealth: Payer: Self-pay | Admitting: *Deleted

## 2012-04-09 NOTE — Telephone Encounter (Signed)
Two or 3 weeks before a dose of Rituxan is optimal.  Is she still having diarrhea?   Thanks.

## 2012-04-09 NOTE — Telephone Encounter (Signed)
Called pt back and informed Per Dr. Gaylyn Rong, scheduling her colonoscopy for 2 to 3 weeks prior to Rituxan treatment is optimal.  Pt verbalized understanding and states diarrhea has improved somewhat after starting Align,  But it has been 6 yrs since her last colonoscopy so Dr. Ewing Schlein wants her to have one any way.   Pt also reports very itchy red rash under her right breast which has not cleared up w/ using hydrocortisone cream.  She asks if it could be related to her PAC on same side?   Instructed pt it is not r/t her PAC.  Instructed her to try OTC anti fungal cream such as clortrimazole and to call her PCP if rash worsens or does not subside after a week.  She verbalized understanding.

## 2012-04-09 NOTE — Telephone Encounter (Signed)
Pt called to report her GI MD wants her to have a colonoscopy. She asks when it would be ok to schedule in relation to her every 2 months Rituxan treatments?  Before or after the treatments or in between?

## 2012-04-24 NOTE — Progress Notes (Signed)
Received call from Britta Mccreedy, RN with Dr. Leary Roca @ [facility], [phone]) stating that patient has cancelled colonoscopy that was scheduled for 04/26/12; office has rescheduled colonoscopy for her several times in the past.

## 2012-05-09 ENCOUNTER — Telehealth: Payer: Self-pay | Admitting: *Deleted

## 2012-05-09 DIAGNOSIS — C8290 Follicular lymphoma, unspecified, unspecified site: Secondary | ICD-10-CM

## 2012-05-09 NOTE — Telephone Encounter (Signed)
Pt called to request her labs be drawn from [facility] on 6/11 prior to CT scan.   Sent POF to scheduler to add pt on for Flush RN to draw labs and keep PAC accessed for CT scan.  Pt aware to arrive at 8:15 am for labs and Flush at 8:30am prior to CT scan.  She will bring her Barium w/ her to drink as instructed.

## 2012-05-10 ENCOUNTER — Telehealth: Payer: Self-pay | Admitting: Oncology

## 2012-05-10 NOTE — Telephone Encounter (Signed)
per pof 06/05 changed lab time on 06/11 to 8:15am and added flush appt for 8:30am.  per pof pt is aware of appt changes

## 2012-05-14 ENCOUNTER — Telehealth: Payer: Self-pay | Admitting: *Deleted

## 2012-05-14 NOTE — Telephone Encounter (Signed)
Pt called to clarify her prep orders for her CT scan tomorrow.  She says she already called Radiology and got instructions but they differ somewhat from instructions given on her last CT.  Instructed her to follow the instructions she just got as far as being NPO and drinking her barium tomorrow morning.  She agreed.

## 2012-05-15 ENCOUNTER — Ambulatory Visit (HOSPITAL_COMMUNITY)
Admission: RE | Admit: 2012-05-15 | Discharge: 2012-05-15 | Disposition: A | Discharge status: Home / Self Care | Payer: Medicare Other | Source: Ambulatory Visit | Attending: Oncology | Admitting: Oncology

## 2012-05-15 ENCOUNTER — Other Ambulatory Visit (HOSPITAL_BASED_OUTPATIENT_CLINIC_OR_DEPARTMENT_OTHER): Payer: Medicare Other | Admitting: Lab

## 2012-05-15 ENCOUNTER — Other Ambulatory Visit: Payer: Medicare Other | Admitting: Lab

## 2012-05-15 ENCOUNTER — Ambulatory Visit (HOSPITAL_BASED_OUTPATIENT_CLINIC_OR_DEPARTMENT_OTHER): Payer: Medicare Other

## 2012-05-15 VITALS — BP 176/102 | HR 92 | Temp 97.8°F

## 2012-05-15 DIAGNOSIS — C8290 Follicular lymphoma, unspecified, unspecified site: Secondary | ICD-10-CM

## 2012-05-15 DIAGNOSIS — I700 Atherosclerosis of aorta: Secondary | ICD-10-CM | POA: Insufficient documentation

## 2012-05-15 DIAGNOSIS — I2510 Atherosclerotic heart disease of native coronary artery without angina pectoris: Secondary | ICD-10-CM | POA: Insufficient documentation

## 2012-05-15 DIAGNOSIS — Z79899 Other long term (current) drug therapy: Secondary | ICD-10-CM | POA: Insufficient documentation

## 2012-05-15 DIAGNOSIS — S32009A Unspecified fracture of unspecified lumbar vertebra, initial encounter for closed fracture: Secondary | ICD-10-CM | POA: Insufficient documentation

## 2012-05-15 DIAGNOSIS — C8299 Follicular lymphoma, unspecified, extranodal and solid organ sites: Secondary | ICD-10-CM

## 2012-05-15 DIAGNOSIS — I77819 Aortic ectasia, unspecified site: Secondary | ICD-10-CM | POA: Insufficient documentation

## 2012-05-15 DIAGNOSIS — M949 Disorder of cartilage, unspecified: Secondary | ICD-10-CM | POA: Insufficient documentation

## 2012-05-15 DIAGNOSIS — Z452 Encounter for adjustment and management of vascular access device: Secondary | ICD-10-CM

## 2012-05-15 DIAGNOSIS — C8590 Non-Hodgkin lymphoma, unspecified, unspecified site: Secondary | ICD-10-CM

## 2012-05-15 DIAGNOSIS — C8589 Other specified types of non-Hodgkin lymphoma, extranodal and solid organ sites: Secondary | ICD-10-CM

## 2012-05-15 DIAGNOSIS — M899 Disorder of bone, unspecified: Secondary | ICD-10-CM | POA: Insufficient documentation

## 2012-05-15 DIAGNOSIS — X58XXXA Exposure to other specified factors, initial encounter: Secondary | ICD-10-CM | POA: Insufficient documentation

## 2012-05-15 LAB — CBC WITH DIFFERENTIAL/PLATELET
Basophils Absolute: 0.1 10*3/uL (ref 0.0–0.1)
Eosinophils Absolute: 0.3 10*3/uL (ref 0.0–0.5)
HGB: 14.8 g/dL (ref 11.6–15.9)
MONO#: 0.8 10*3/uL (ref 0.1–0.9)
NEUT#: 5.7 10*3/uL (ref 1.5–6.5)
RDW: 13.9 % (ref 11.2–14.5)
lymph#: 0.8 10*3/uL — ABNORMAL LOW (ref 0.9–3.3)

## 2012-05-15 LAB — COMPREHENSIVE METABOLIC PANEL
Albumin: 4.3 g/dL (ref 3.5–5.2)
BUN: 8 mg/dL (ref 6–23)
Calcium: 9.2 mg/dL (ref 8.4–10.5)
Chloride: 96 mEq/L (ref 96–112)
Glucose, Bld: 88 mg/dL (ref 70–99)
Potassium: 3.7 mEq/L (ref 3.5–5.3)

## 2012-05-15 LAB — LACTATE DEHYDROGENASE: LDH: 210 U/L (ref 94–250)

## 2012-05-15 MED ORDER — IOHEXOL 300 MG/ML  SOLN
100.0000 mL | Freq: Once | INTRAMUSCULAR | Status: AC | PRN
  Administered 2012-05-15: 100 mL via INTRAVENOUS

## 2012-05-15 MED ORDER — HEPARIN SOD (PORK) LOCK FLUSH 100 UNIT/ML IV SOLN
500.0000 [IU] | Freq: Once | INTRAVENOUS | Status: AC
  Administered 2012-05-15: 500 [IU] via INTRAVENOUS
  Filled 2012-05-15: qty 5

## 2012-05-15 MED ORDER — SODIUM CHLORIDE 0.9 % IJ SOLN
10.0000 mL | INTRAMUSCULAR | Status: DC | PRN
  Administered 2012-05-15: 10 mL via INTRAVENOUS
  Filled 2012-05-15: qty 10

## 2012-05-15 NOTE — Progress Notes (Signed)
Pt  With increased bp, taken twice.  Pt stated she has been in a "tizzy" getting up here, drinking the contrast and nervous regrding scan.

## 2012-05-16 ENCOUNTER — Ambulatory Visit (HOSPITAL_BASED_OUTPATIENT_CLINIC_OR_DEPARTMENT_OTHER): Payer: Medicare Other | Admitting: Oncology

## 2012-05-16 ENCOUNTER — Ambulatory Visit (HOSPITAL_BASED_OUTPATIENT_CLINIC_OR_DEPARTMENT_OTHER): Payer: Medicare Other

## 2012-05-16 ENCOUNTER — Telehealth: Payer: Self-pay | Admitting: Oncology

## 2012-05-16 VITALS — BP 143/89 | HR 83 | Temp 97.9°F | Ht 65.0 in | Wt 110.6 lb

## 2012-05-16 VITALS — BP 138/82 | HR 63 | Temp 98.1°F

## 2012-05-16 DIAGNOSIS — I719 Aortic aneurysm of unspecified site, without rupture: Secondary | ICD-10-CM

## 2012-05-16 DIAGNOSIS — C8290 Follicular lymphoma, unspecified, unspecified site: Secondary | ICD-10-CM

## 2012-05-16 DIAGNOSIS — F411 Generalized anxiety disorder: Secondary | ICD-10-CM

## 2012-05-16 DIAGNOSIS — Z5112 Encounter for antineoplastic immunotherapy: Secondary | ICD-10-CM

## 2012-05-16 DIAGNOSIS — C8299 Follicular lymphoma, unspecified, extranodal and solid organ sites: Secondary | ICD-10-CM

## 2012-05-16 DIAGNOSIS — F17200 Nicotine dependence, unspecified, uncomplicated: Secondary | ICD-10-CM

## 2012-05-16 MED ORDER — SODIUM CHLORIDE 0.9 % IV SOLN: 375.0000 mg/m2 | Freq: Once | INTRAVENOUS | Status: DC

## 2012-05-16 MED ORDER — ACETAMINOPHEN 325 MG PO TABS
650.0000 mg | ORAL_TABLET | Freq: Once | ORAL | Status: AC
  Administered 2012-05-16: 650 mg via ORAL

## 2012-05-16 MED ORDER — SODIUM CHLORIDE 0.9 % IV SOLN
375.0000 mg/m2 | Freq: Once | INTRAVENOUS | Status: AC
  Administered 2012-05-16: 600 mg via INTRAVENOUS
  Filled 2012-05-16: qty 60

## 2012-05-16 MED ORDER — SODIUM CHLORIDE 0.9 % IV SOLN
Freq: Once | INTRAVENOUS | Status: AC
  Administered 2012-05-16: 12:00:00 via INTRAVENOUS

## 2012-05-16 MED ORDER — HEPARIN SOD (PORK) LOCK FLUSH 100 UNIT/ML IV SOLN
500.0000 [IU] | Freq: Once | INTRAVENOUS | Status: AC | PRN
  Administered 2012-05-16: 500 [IU]
  Filled 2012-05-16: qty 5

## 2012-05-16 MED ORDER — SODIUM CHLORIDE 0.9 % IJ SOLN
10.0000 mL | INTRAMUSCULAR | Status: DC | PRN
  Administered 2012-05-16: 10 mL
  Filled 2012-05-16: qty 10

## 2012-05-16 MED ORDER — DIPHENHYDRAMINE HCL 25 MG PO CAPS
50.0000 mg | ORAL_CAPSULE | Freq: Once | ORAL | Status: AC
  Administered 2012-05-16: 50 mg via ORAL

## 2012-05-16 NOTE — Progress Notes (Signed)
[facility]  [phone] [phone]   OFFICE PROGRESS NOTE   Cc:  SHAW,KIMBERLEE, MD   DIAGNOSIS: Stage III, grade 1 non-Hodgkin's B-cell lymphoma (best described as follicular lymphoma); FLIPI score high risk (age >5; stage III; more than 4 nodal involvements)   PAST THERAPY: Bendamustine/Rituxan days 1 and 2 out of q28 days between 06/16/10 and 11/09/2010 from which she achieved complete remission.   CURRENT THERAPY: started maintenance Rituxan q82month x 2 years in Feb 2012.  INTERVAL HISTORY: Lauren Maxwell 78 y.o. female returns for regular followup by himself. She reported that she has cut down on daily vitamin and only takes it every other day. She also has been eating yogurt and taking probiotic. She has no longer any diarrhea.  She reports normal appetite but however she has not regained weight from the time she was having diarrhea. She denies any adenopathy, drenching night sweats, fatigue, pain.  Patient denies fatigue, headache, visual changes, confusion, drenching night sweats, palpable lymph node swelling, mucositis, odynophagia, dysphagia, nausea vomiting, jaundice, chest pain, palpitation, shortness of breath, dyspnea on exertion, productive cough, gum bleeding, epistaxis, hematemesis, hemoptysis, abdominal pain, abdominal swelling, early satiety, melena, hematochezia, hematuria, skin rash, spontaneous bleeding, joint swelling, joint pain, heat or cold intolerance, bowel bladder incontinence, back pain, focal motor weakness, paresthesia, depression, suicidal or homocidal ideation, feeling hopelessness.   Past Medical History  Diagnosis Date  . Follicular lymphoma   . Smoking   . Hypertension   . Anxiety and depression   . Ascending aortic aneurysm     Past Surgical History  Procedure Date  . Tonsillectomy   . Colonoscopy   . Tubal ligation   . Thyroid nodule excision     Current Outpatient Prescriptions  Medication Sig  Dispense Refill  . aspirin 81 MG tablet Take 81 mg by mouth every other day.       . lidocaine-prilocaine (EMLA) cream Apply topically as needed. Apply to [facility] site one hour prior to needle access as needed.  30 g  2  . lisinopril (PRINIVIL,ZESTRIL) 20 MG tablet Take 20 mg by mouth 2 (two) times daily.       . Multiple Vitamin (MULTIVITAMIN) tablet Take 1 tablet by mouth every other day. Centrum Silver       . sodium chloride (OCEAN) 0.65 % nasal spray Place 1 spray into the nose as needed. For nasal congestion.      Marland Kitchen tiotropium (SPIRIVA) 18 MCG inhalation capsule Place 18 mcg into inhaler and inhale daily.         No current facility-administered medications for this visit.   Facility-Administered Medications Ordered in Other Visits  Medication Dose Route Frequency Provider Last Rate Last Dose  . 0.9 %  sodium chloride infusion   Intravenous Once Exie Parody, MD      . acetaminophen (TYLENOL) tablet 650 mg  650 mg Oral Once Exie Parody, MD   650 mg at 05/16/12 1143  . diphenhydrAMINE (BENADRYL) capsule 50 mg  50 mg Oral Once Exie Parody, MD   50 mg at 05/16/12 1143  . heparin lock flush 100 unit/mL  500 Units Intracatheter Once PRN Exie Parody, MD   500 Units at 05/16/12 1402  . riTUXimab (RITUXAN) 600 mg in sodium chloride 0.9 % 190 mL chemo infusion  375 mg/m2 (Order-Specific) Intravenous Once Exie Parody, MD   600 mg at 05/16/12 1250  . DISCONTD: riTUXimab (RITUXAN) 600 mg in sodium chloride  0.9 % 250 mL chemo infusion  375 mg/m2 (Treatment Plan Actual) Intravenous Once Exie Parody, MD      . DISCONTD: sodium chloride 0.9 % injection 10 mL  10 mL Intracatheter PRN Exie Parody, MD   10 mL at 05/16/12 1402    ALLERGIES:  is allergic to codeine.  REVIEW OF SYSTEMS:  The rest of the 14-point review of system was negative.   Filed Vitals:   05/16/12 1047  BP: 143/89  Pulse: 83  Temp: 97.9 F (36.6 C)   Wt Readings from Last 3 Encounters:  05/16/12 110 lb 9.6 oz (50.168 kg)  03/16/12 112 lb  11.2 oz (51.12 kg)  01/13/12 112 lb 14.4 oz (51.211 kg)   ECOG Performance status: 0  PHYSICAL EXAMINATION:  General: Thin-appearing woman in no acute distress. Eyes: no scleral icterus. ENT: There were no oropharyngeal lesions. Neck was without thyromegaly. Lymphatics: Negative cervical, supraclavicular or axillary adenopathy. Respiratory: lungs were clear bilaterally without wheezing or crackles. Cardiovascular: Regular rate and rhythm, S1/S2, without murmur, rub or gallop. There was no pedal edema. GI: abdomen was soft, flat, nontender, nondistended, without organomegaly. Muscoloskeletal: no spinal tenderness of palpation of vertebral spine. Portacath area showed mild erythema and edema without skin breakthrough or purulent discharge. Skin exam was without echymosis, petichae. Neuro exam was nonfocal. Patient was able to get on and off exam table without assistance. Gait was normal. Patient was alerted and oriented. Attention was good. Language was appropriate. Mood was normal without depression. Speech was not pressured. Thought content was not tangential.   LABORATORY/RADIOLOGY DATA:  Lab Results  Component Value Date   WBC 7.6 05/15/2012   HGB 14.8 05/15/2012   HCT 42.9 05/15/2012   PLT 178 05/15/2012   GLUCOSE 88 05/15/2012   CHOL 156 08/10/2010   TRIG 77 08/10/2010   HDL 67 08/10/2010   LDLCALC 74 08/10/2010   ALKPHOS 85 05/15/2012   ALT 19 05/15/2012   AST 25 05/15/2012   NA 135 05/15/2012   K 3.7 05/15/2012   CL 96 05/15/2012   CREATININE 0.67 05/15/2012   BUN 8 05/15/2012   CO2 26 05/15/2012   INR 0.79 01/06/2012   IMAGING:  I personally reviewed the following CT and showed the patient the images.   Ct Chest W Contrast  05/15/2012  *RADIOLOGY REPORT*  Clinical Data:  Non-Hodgkins lymphoma diagnosed May 2011, ongoing chemotherapy.  Restaging.  CT CHEST, ABDOMEN AND PELVIS WITH CONTRAST  Technique:  Multidetector CT imaging of the chest, abdomen and pelvis was performed following the standard  protocol during bolus administration of intravenous contrast.  Contrast: OMNIPAQUE IOHEXOL 300 MG/ML  SOLN  Comparison:  11/17/2011  CT CHEST  Findings:  Stable mild ascending aortic ectasia measuring 4 cm in maximal AP diameter at the level of the main pulmonary artery bifurcation image 32.  Moderate atherosclerotic aortic and coronary arterial calcifications.  Great vessels are normal in caliber. Right-sided Port-A-Cath in place with tip in the distal SVC.  No pericardial or pleural effusion.  No subcarinal, hilar, or axillary lymphadenopathy.  Evidence of right thyroidectomy noted without recurrent mass in the right thyroid bed.  Left thyroid gland appears normal by CT.  Minimal linear dependent right lower lobe atelectasis or scarring noted.  Mild emphysematous changes.  No consolidation, mass, or new pulmonary nodule.  Minimal biapical pleural thickening.  This is stable.  Bones are osteopenic but no new osseous abnormality is seen within the bony thorax.  On review  of sagittal images, there is stable mild central inferior endplate compression deformity at L2.  IMPRESSION: No new intrathoracic abnormality or evidence for disease recurrence.  Stable ascending aortic ectasia.  CT ABDOMEN AND PELVIS  Findings:  Stable mild haziness at the root of the small bowel mesentery, compatible with appearance of previous to the lymphoma. Root of small bowel mesentery lymph node is stable measuring 7 mm in short axis image 68.  Liver, adrenal glands, kidneys, spleen, and pancreas are normal.  Dependent sludge or tiny stones again noted within the otherwise normal appearing gallbladder.  No new lymphadenopathy.  Moderate atherosclerotic aortic calcification without aneurysm.  No new intra-abdominal, retroperitoneal, or pelvic lymphadenopathy.  Uterus and ovaries are normal.  Bowel and appendix are normal.  No new osseous abnormality.  Lumbar spine degenerative change again noted.  IMPRESSION:  No CT evidence for  recurrent or new disease in the abdomen or pelvis.  Stable L2 compression fracture.  Original Report Authenticated By: Harrel Lemon, M.D.   Ct Abdomen Pelvis W Contrast  05/15/2012  *RADIOLOGY REPORT*  Clinical Data:  Non-Hodgkins lymphoma diagnosed May 2011, ongoing chemotherapy.  Restaging.  CT CHEST, ABDOMEN AND PELVIS WITH CONTRAST  Technique:  Multidetector CT imaging of the chest, abdomen and pelvis was performed following the standard protocol during bolus administration of intravenous contrast.  Contrast: OMNIPAQUE IOHEXOL 300 MG/ML  SOLN  Comparison:  11/17/2011  CT CHEST  Findings:  Stable mild ascending aortic ectasia measuring 4 cm in maximal AP diameter at the level of the main pulmonary artery bifurcation image 32.  Moderate atherosclerotic aortic and coronary arterial calcifications.  Great vessels are normal in caliber. Right-sided Port-A-Cath in place with tip in the distal SVC.  No pericardial or pleural effusion.  No subcarinal, hilar, or axillary lymphadenopathy.  Evidence of right thyroidectomy noted without recurrent mass in the right thyroid bed.  Left thyroid gland appears normal by CT.  Minimal linear dependent right lower lobe atelectasis or scarring noted.  Mild emphysematous changes.  No consolidation, mass, or new pulmonary nodule.  Minimal biapical pleural thickening.  This is stable.  Bones are osteopenic but no new osseous abnormality is seen within the bony thorax.  On review of sagittal images, there is stable mild central inferior endplate compression deformity at L2.  IMPRESSION: No new intrathoracic abnormality or evidence for disease recurrence.  Stable ascending aortic ectasia.  CT ABDOMEN AND PELVIS  Findings:  Stable mild haziness at the root of the small bowel mesentery, compatible with appearance of previous to the lymphoma. Root of small bowel mesentery lymph node is stable measuring 7 mm in short axis image 68.  Liver, adrenal glands, kidneys, spleen, and  pancreas are normal.  Dependent sludge or tiny stones again noted within the otherwise normal appearing gallbladder.  No new lymphadenopathy.  Moderate atherosclerotic aortic calcification without aneurysm.  No new intra-abdominal, retroperitoneal, or pelvic lymphadenopathy.  Uterus and ovaries are normal.  Bowel and appendix are normal.  No new osseous abnormality.  Lumbar spine degenerative change again noted.  IMPRESSION:  No CT evidence for recurrent or new disease in the abdomen or pelvis.  Stable L2 compression fracture.  Original Report Authenticated By: Harrel Lemon, M.D.     ASSESSMENT AND PLAN:   1. Non-Hodgkin lymphoma: She is doing well with maintenance Rituxan q48month since 01/2011. Goal is for 2 years total. Her surveillance CT scan did not show any recurrent lymphoma. I recommend proceeding with maintenance Rituxan dose today without  dose modification. 2. Diarrhea: Most likely due to IBS. This is now resolved. There is very low suspicion for Rituxan induced diarrhea. 3. Smoking. Still smoking. She does not want to address this again today. We will address this again after Rixutan is finished.  4. History of aortic aneurysm. Stable size on this CT. We'll follow up on this on next surveillance CT for lymphoma.  5. Hypertension. She is on Lisinopril per PCP.   6. Anxiety, depression: her mood was stable today. 7. Lymphocytopenia without leukocytopenia: resolved.  8. Follow up with me in 2 months before the next dose of Rituxan.

## 2012-05-16 NOTE — Telephone Encounter (Signed)
appts made and printed for pt aom °

## 2012-05-16 NOTE — Patient Instructions (Addendum)
1. History of follicular lymphoma: No evidence of recurrence of disease on CT scan. I recommend to continue Rituxan maintenance every 2 months. This is due to finish in December of 2013. 2. Followup: Lab and return visit and Rituxan in about 2 months.

## 2012-05-16 NOTE — Patient Instructions (Signed)
[facility] Discharge Instructions for Patients Receiving Chemotherapy  Today you received the following chemotherapy agents Rituxan   To help prevent nausea and vomiting after your treatment, we encourage you to take your nausea medication as prescribed If you develop nausea and vomiting that is not controlled by your nausea medication, call the clinic. If it is after clinic hours your family physician or the after hours number for the clinic or go to the Emergency Department.   BELOW ARE SYMPTOMS THAT SHOULD BE REPORTED IMMEDIATELY:  *FEVER GREATER THAN 100.5 F  *CHILLS WITH OR WITHOUT FEVER  NAUSEA AND VOMITING THAT IS NOT CONTROLLED WITH YOUR NAUSEA MEDICATION  *UNUSUAL SHORTNESS OF BREATH  *UNUSUAL BRUISING OR BLEEDING  TENDERNESS IN MOUTH AND THROAT WITH OR WITHOUT PRESENCE OF ULCERS  *URINARY PROBLEMS  *BOWEL PROBLEMS  UNUSUAL RASH Items with * indicate a potential emergency and should be followed up as soon as possible.  One of the nurses will contact you 24 hours after your treatment. Please let the nurse know about any problems that you may have experienced. Feel free to call the clinic you have any questions or concerns. The clinic phone number is [phone].   I have been informed and understand all the instructions given to me. I know to contact the clinic, my physician, or go to the Emergency Department if any problems should occur. I do not have any questions at this time, but understand that I may call the clinic during office hours   should I have any questions or need assistance in obtaining follow up care.    __________________________________________  _____________  __________ Signature of Patient or Authorized Representative            Date                   Time    __________________________________________ Nurse's Signature    

## 2012-07-16 ENCOUNTER — Ambulatory Visit (HOSPITAL_BASED_OUTPATIENT_CLINIC_OR_DEPARTMENT_OTHER): Payer: Medicare Other | Admitting: Oncology

## 2012-07-16 ENCOUNTER — Other Ambulatory Visit (HOSPITAL_BASED_OUTPATIENT_CLINIC_OR_DEPARTMENT_OTHER): Payer: Medicare Other | Admitting: Lab

## 2012-07-16 ENCOUNTER — Encounter: Payer: Self-pay | Admitting: Oncology

## 2012-07-16 ENCOUNTER — Ambulatory Visit (HOSPITAL_BASED_OUTPATIENT_CLINIC_OR_DEPARTMENT_OTHER): Payer: Medicare Other

## 2012-07-16 VITALS — BP 143/86 | HR 82 | Temp 97.3°F | Resp 20 | Ht 65.0 in | Wt 111.5 lb

## 2012-07-16 VITALS — BP 133/74 | HR 68 | Temp 97.7°F | Resp 20

## 2012-07-16 DIAGNOSIS — C8299 Follicular lymphoma, unspecified, extranodal and solid organ sites: Secondary | ICD-10-CM

## 2012-07-16 DIAGNOSIS — I719 Aortic aneurysm of unspecified site, without rupture: Secondary | ICD-10-CM

## 2012-07-16 DIAGNOSIS — Z5112 Encounter for antineoplastic immunotherapy: Secondary | ICD-10-CM

## 2012-07-16 DIAGNOSIS — C8290 Follicular lymphoma, unspecified, unspecified site: Secondary | ICD-10-CM

## 2012-07-16 DIAGNOSIS — F17200 Nicotine dependence, unspecified, uncomplicated: Secondary | ICD-10-CM

## 2012-07-16 LAB — CBC WITH DIFFERENTIAL/PLATELET
Basophils Absolute: 0 10*3/uL (ref 0.0–0.1)
Eosinophils Absolute: 0.2 10*3/uL (ref 0.0–0.5)
HCT: 41.3 % (ref 34.8–46.6)
HGB: 14.8 g/dL (ref 11.6–15.9)
MCH: 34.6 pg — ABNORMAL HIGH (ref 25.1–34.0)
MONO#: 0.8 10*3/uL (ref 0.1–0.9)
NEUT#: 5.5 10*3/uL (ref 1.5–6.5)
NEUT%: 74.4 % (ref 38.4–76.8)
RDW: 13.1 % (ref 11.2–14.5)
WBC: 7.4 10*3/uL (ref 3.9–10.3)
lymph#: 0.8 10*3/uL — ABNORMAL LOW (ref 0.9–3.3)

## 2012-07-16 LAB — COMPREHENSIVE METABOLIC PANEL
Albumin: 4.4 g/dL (ref 3.5–5.2)
Alkaline Phosphatase: 74 U/L (ref 39–117)
BUN: 9 mg/dL (ref 6–23)
CO2: 27 mEq/L (ref 19–32)
Calcium: 9.6 mg/dL (ref 8.4–10.5)
Chloride: 100 mEq/L (ref 96–112)
Glucose, Bld: 81 mg/dL (ref 70–99)
Potassium: 4.1 mEq/L (ref 3.5–5.3)
Sodium: 134 mEq/L — ABNORMAL LOW (ref 135–145)
Total Protein: 6.1 g/dL (ref 6.0–8.3)

## 2012-07-16 MED ORDER — SODIUM CHLORIDE 0.9 % IV SOLN: 375.0000 mg/m2 | Freq: Once | INTRAVENOUS | Status: DC

## 2012-07-16 MED ORDER — SODIUM CHLORIDE 0.9 % IV SOLN
375.0000 mg/m2 | Freq: Once | INTRAVENOUS | Status: AC
  Administered 2012-07-16: 600 mg via INTRAVENOUS
  Filled 2012-07-16: qty 60

## 2012-07-16 MED ORDER — SODIUM CHLORIDE 0.9 % IV SOLN
Freq: Once | INTRAVENOUS | Status: AC
  Administered 2012-07-16: 10:00:00 via INTRAVENOUS

## 2012-07-16 MED ORDER — DIPHENHYDRAMINE HCL 25 MG PO CAPS
50.0000 mg | ORAL_CAPSULE | Freq: Once | ORAL | Status: AC
  Administered 2012-07-16: 50 mg via ORAL

## 2012-07-16 MED ORDER — HEPARIN SOD (PORK) LOCK FLUSH 100 UNIT/ML IV SOLN
500.0000 [IU] | Freq: Once | INTRAVENOUS | Status: AC | PRN
  Administered 2012-07-16: 500 [IU]
  Filled 2012-07-16: qty 5

## 2012-07-16 MED ORDER — ACETAMINOPHEN 325 MG PO TABS
650.0000 mg | ORAL_TABLET | Freq: Once | ORAL | Status: AC
  Administered 2012-07-16: 650 mg via ORAL

## 2012-07-16 MED ORDER — SODIUM CHLORIDE 0.9 % IJ SOLN
10.0000 mL | INTRAMUSCULAR | Status: DC | PRN
  Administered 2012-07-16: 10 mL
  Filled 2012-07-16: qty 10

## 2012-07-16 NOTE — Patient Instructions (Signed)
[facility] Discharge Instructions for Patients Receiving Chemotherapy  Today you received the following chemotherapy agents Rituxan   To help prevent nausea and vomiting after your treatment, we encourage you to take your nausea medication as prescribed If you develop nausea and vomiting that is not controlled by your nausea medication, call the clinic. If it is after clinic hours your family physician or the after hours number for the clinic or go to the Emergency Department.   BELOW ARE SYMPTOMS THAT SHOULD BE REPORTED IMMEDIATELY:  *FEVER GREATER THAN 100.5 F  *CHILLS WITH OR WITHOUT FEVER  NAUSEA AND VOMITING THAT IS NOT CONTROLLED WITH YOUR NAUSEA MEDICATION  *UNUSUAL SHORTNESS OF BREATH  *UNUSUAL BRUISING OR BLEEDING  TENDERNESS IN MOUTH AND THROAT WITH OR WITHOUT PRESENCE OF ULCERS  *URINARY PROBLEMS  *BOWEL PROBLEMS  UNUSUAL RASH Items with * indicate a potential emergency and should be followed up as soon as possible.  One of the nurses will contact you 24 hours after your treatment. Please let the nurse know about any problems that you may have experienced. Feel free to call the clinic you have any questions or concerns. The clinic phone number is [phone].   I have been informed and understand all the instructions given to me. I know to contact the clinic, my physician, or go to the Emergency Department if any problems should occur. I do not have any questions at this time, but understand that I may call the clinic during office hours   should I have any questions or need assistance in obtaining follow up care.    __________________________________________  _____________  __________ Signature of Patient or Authorized Representative            Date                   Time    __________________________________________ Nurse's Signature    

## 2012-07-16 NOTE — Progress Notes (Signed)
[facility]  Telephone:([phone] [phone]   OFFICE PROGRESS NOTE   Cc:  SHAW,KIMBERLEE, MD   DIAGNOSIS: Stage III, grade 1 non-Hodgkin's B-cell lymphoma (best described as follicular lymphoma); FLIPI score high risk (age >40; stage III; more than 4 nodal involvements)   PAST THERAPY: Bendamustine/Rituxan days 1 and 2 out of q28 days between 06/16/10 and 11/09/2010 from which she achieved complete remission.   CURRENT THERAPY: started maintenance Rituxan q2month x 2 years in Feb 2012.  INTERVAL HISTORY: Lauren Maxwell 78 y.o. female returns for regular followup by herself. She reported that she has cut down on daily vitamin and only takes it every other day. She used Librarian, academic and ate yogurt for her diarrhea that has now resolved.She reports normal appetite but however she has not regained weight from the time she was having diarrhea. She denies any adenopathy, drenching night sweats, fatigue, pain.  Patient denies fatigue, headache, visual changes, confusion, drenching night sweats, palpable lymph node swelling, mucositis, odynophagia, dysphagia, nausea vomiting, jaundice, chest pain, palpitation, shortness of breath, dyspnea on exertion, productive cough, gum bleeding, epistaxis, hematemesis, hemoptysis, abdominal pain, abdominal swelling, early satiety, melena, hematochezia, hematuria, skin rash, spontaneous bleeding, joint swelling, joint pain, heat or cold intolerance, bowel bladder incontinence, back pain, focal motor weakness, paresthesia, depression, suicidal or homocidal ideation, feeling hopelessness.   Past Medical History  Diagnosis Date  . Follicular lymphoma   . Smoking   . Hypertension   . Anxiety and depression   . Ascending aortic aneurysm     Past Surgical History  Procedure Date  . Tonsillectomy   . Colonoscopy   . Tubal ligation   . Thyroid nodule excision     Current Outpatient Prescriptions  Medication Sig Dispense Refill  .  aspirin 81 MG tablet Take 81 mg by mouth every other day.       . lidocaine-prilocaine (EMLA) cream Apply topically as needed. Apply to [facility] site one hour prior to needle access as needed.  30 g  2  . lisinopril (PRINIVIL,ZESTRIL) 20 MG tablet Take 20 mg by mouth 2 (two) times daily.       . Multiple Vitamin (MULTIVITAMIN) tablet Take 1 tablet by mouth every other day. Centrum Silver       . sodium chloride (OCEAN) 0.65 % nasal spray Place 1 spray into the nose as needed. For nasal congestion.      Marland Kitchen tiotropium (SPIRIVA) 18 MCG inhalation capsule Place 18 mcg into inhaler and inhale daily.         No current facility-administered medications for this visit.   Facility-Administered Medications Ordered in Other Visits  Medication Dose Route Frequency Provider Last Rate Last Dose  . 0.9 %  sodium chloride infusion   Intravenous Once Exie Parody, MD 20 mL/hr at 07/16/12 1025    . acetaminophen (TYLENOL) tablet 650 mg  650 mg Oral Once Exie Parody, MD   650 mg at 07/16/12 1036  . diphenhydrAMINE (BENADRYL) capsule 50 mg  50 mg Oral Once Exie Parody, MD   50 mg at 07/16/12 1036  . heparin lock flush 100 unit/mL  500 Units Intracatheter Once PRN Exie Parody, MD      . riTUXimab (RITUXAN) 600 mg in sodium chloride 0.9 % 190 mL chemo infusion  375 mg/m2 (Order-Specific) Intravenous Once Exie Parody, MD 200 mL/hr at 07/16/12 1208 600 mg at 07/16/12 1208  . sodium chloride 0.9 % injection 10 mL  10  mL Intracatheter PRN Exie Parody, MD      . DISCONTD: riTUXimab (RITUXAN) 600 mg in sodium chloride 0.9 % 250 mL chemo infusion  375 mg/m2 (Treatment Plan Actual) Intravenous Once Exie Parody, MD        ALLERGIES:  is allergic to codeine.  REVIEW OF SYSTEMS:  The rest of the 14-point review of system was negative.   Filed Vitals:   07/16/12 0948  BP: 143/86  Pulse: 82  Temp: 97.3 F (36.3 C)  Resp: 20   Wt Readings from Last 3 Encounters:  07/16/12 111 lb 8 oz (50.576 kg)  05/16/12 110 lb 9.6 oz (50.168  kg)  03/16/12 112 lb 11.2 oz (51.12 kg)   ECOG Performance status: 0  PHYSICAL EXAMINATION:  General: Thin-appearing woman in no acute distress. Eyes: no scleral icterus. ENT: There were no oropharyngeal lesions. Neck was without thyromegaly. Lymphatics: Negative cervical, supraclavicular or axillary adenopathy. Respiratory: lungs were clear bilaterally without wheezing or crackles. Cardiovascular: Regular rate and rhythm, S1/S2, without murmur, rub or gallop. There was no pedal edema. GI: abdomen was soft, flat, nontender, nondistended, without organomegaly. Muscoloskeletal: no spinal tenderness of palpation of vertebral spine. Portacath area showed mild erythema and edema without skin breakthrough or purulent discharge. Skin exam was without echymosis, petichae. Neuro exam was nonfocal. Patient was able to get on and off exam table without assistance. Gait was normal. Patient was alerted and oriented. Attention was good. Language was appropriate. Mood was normal without depression. Speech was not pressured. Thought content was not tangential.   LABORATORY/RADIOLOGY DATA:  Lab Results  Component Value Date   WBC 7.4 07/16/2012   HGB 14.8 07/16/2012   HCT 41.3 07/16/2012   PLT 187 07/16/2012   GLUCOSE 88 05/15/2012   CHOL 156 08/10/2010   TRIG 77 08/10/2010   HDL 67 08/10/2010   LDLCALC 74 08/10/2010   ALKPHOS 85 05/15/2012   ALT 19 05/15/2012   AST 25 05/15/2012   NA 135 05/15/2012   K 3.7 05/15/2012   CL 96 05/15/2012   CREATININE 0.67 05/15/2012   BUN 8 05/15/2012   CO2 26 05/15/2012   INR 0.79 01/06/2012   ASSESSMENT AND PLAN:   1. Non-Hodgkin lymphoma: She is doing well with maintenance Rituxan q76month since 01/2011. Goal is for 2 years total. Her surveillance CT in June 2013 scan did not show any recurrent lymphoma. She does not have any evidence of lymphoma by labs, clinical history, or physical examination. Recommend proceeding with maintenance Rituxan dose today without dose  modification. 2. Diarrhea: Most likely due to IBS. This is now resolved. There is very low suspicion for Rituxan induced diarrhea. 3. Smoking. Still smoking. She does not want to address this again today. We will address this again after Rixutan is finished.  4. History of aortic aneurysm. Stable size on this CT. We'll follow up on this on next surveillance CT for lymphoma.  5. Hypertension. She is on Lisinopril per PCP.   6. Anxiety, depression: her mood was stable today. 7. Lymphocytopenia without leukocytopenia: resolved.  8. Follow up with me in 2 months before the next dose of Rituxan. She is due on 09/17/12. Her December dose of Rituxan is due on 11/19/12 and she has requested that this be delayed until after Christmas. I have tentatively scheduled this for 12/03/12.

## 2012-07-23 ENCOUNTER — Telehealth: Payer: Self-pay | Admitting: Oncology

## 2012-07-23 ENCOUNTER — Telehealth: Payer: Self-pay | Admitting: *Deleted

## 2012-07-23 NOTE — Telephone Encounter (Signed)
S/w the pt and she is aware of her oct appts. Will also mail the pt out a copy of that schedule per her request.

## 2012-07-23 NOTE — Telephone Encounter (Signed)
Per staff message and POF I have scheduled appts.  JMW  

## 2012-08-07 ENCOUNTER — Telehealth: Payer: Self-pay | Admitting: Oncology

## 2012-08-07 NOTE — Telephone Encounter (Signed)
s/w pt,her tx was on 10/4 not 10/14,fixed,made aware and mailed out a new copy     aom

## 2012-09-05 ENCOUNTER — Telehealth: Payer: Self-pay

## 2012-09-05 NOTE — Telephone Encounter (Signed)
Message copied by Kallie Locks on Wed Sep 05, 2012  9:50 AM ------      Message from: HA, Raliegh Ip T      Created: Mon Sep 03, 2012  3:25 PM      Regarding: RE: "Flu shot"      Contact: [phone]       She can do it now.  We need to give it at least two weeks before Rituxan to maximize benefit.  If she can't get it done the next 1-2 days, we can get it done after Rituxan is finished.       ----- Message -----         From: Marcell Barlow, RN         Sent: 09/03/2012   3:07 PM           To: Exie Parody, MD      Subject: "Flu shot"                                               Is it okay for patient to have influenza vaccine? She is scheduled to see you 09/17/12.            Thanks.

## 2012-09-07 ENCOUNTER — Ambulatory Visit: Payer: Medicare Other

## 2012-09-16 NOTE — Patient Instructions (Addendum)
1.  DIAGNOSIS: Stage III, grade 1 non-Hodgkin's B-cell lymphoma (best described as follicular lymphoma); FLIPI score high risk (age >4; stage III; more than 4 nodal involvements)   2.  PAST THERAPY: Bendamustine/Rituxan days 1 and 2 out of q28 days between 06/16/10 and 11/09/2010 from which she achieved complete remission.   3.  CURRENT THERAPY: started maintenance Rituxan q52month x 2 years in Feb 2012.  Last dose is due in Dec 2013.

## 2012-09-17 ENCOUNTER — Ambulatory Visit (HOSPITAL_BASED_OUTPATIENT_CLINIC_OR_DEPARTMENT_OTHER): Payer: Medicare Other

## 2012-09-17 ENCOUNTER — Ambulatory Visit (HOSPITAL_BASED_OUTPATIENT_CLINIC_OR_DEPARTMENT_OTHER): Payer: Medicare Other | Admitting: Oncology

## 2012-09-17 ENCOUNTER — Telehealth: Payer: Self-pay | Admitting: *Deleted

## 2012-09-17 ENCOUNTER — Other Ambulatory Visit (HOSPITAL_BASED_OUTPATIENT_CLINIC_OR_DEPARTMENT_OTHER): Payer: Medicare Other | Admitting: Lab

## 2012-09-17 VITALS — BP 138/68 | HR 71 | Temp 98.1°F | Resp 18

## 2012-09-17 VITALS — BP 144/86 | HR 79 | Temp 97.2°F | Resp 20 | Ht 65.0 in | Wt 111.0 lb

## 2012-09-17 DIAGNOSIS — C8290 Follicular lymphoma, unspecified, unspecified site: Secondary | ICD-10-CM

## 2012-09-17 DIAGNOSIS — C8299 Follicular lymphoma, unspecified, extranodal and solid organ sites: Secondary | ICD-10-CM

## 2012-09-17 DIAGNOSIS — Z5112 Encounter for antineoplastic immunotherapy: Secondary | ICD-10-CM

## 2012-09-17 DIAGNOSIS — F17200 Nicotine dependence, unspecified, uncomplicated: Secondary | ICD-10-CM

## 2012-09-17 LAB — COMPREHENSIVE METABOLIC PANEL (CC13)
AST: 23 U/L (ref 5–34)
Albumin: 4.3 g/dL (ref 3.5–5.0)
BUN: 12 mg/dL (ref 7.0–26.0)
Calcium: 9.4 mg/dL (ref 8.4–10.4)
Chloride: 101 mEq/L (ref 98–107)
Glucose: 95 mg/dl (ref 70–99)
Potassium: 3.9 mEq/L (ref 3.5–5.1)
Sodium: 133 mEq/L — ABNORMAL LOW (ref 136–145)
Total Protein: 6.1 g/dL — ABNORMAL LOW (ref 6.4–8.3)

## 2012-09-17 LAB — CBC WITH DIFFERENTIAL/PLATELET
Basophils Absolute: 0 10*3/uL (ref 0.0–0.1)
HCT: 41.5 % (ref 34.8–46.6)
HGB: 14.5 g/dL (ref 11.6–15.9)
MONO#: 0.8 10*3/uL (ref 0.1–0.9)
NEUT#: 4.6 10*3/uL (ref 1.5–6.5)
NEUT%: 70.9 % (ref 38.4–76.8)
WBC: 6.5 10*3/uL (ref 3.9–10.3)
lymph#: 0.8 10*3/uL — ABNORMAL LOW (ref 0.9–3.3)

## 2012-09-17 MED ORDER — SODIUM CHLORIDE 0.9 % IV SOLN
375.0000 mg/m2 | Freq: Once | INTRAVENOUS | Status: AC
  Administered 2012-09-17: 600 mg via INTRAVENOUS
  Filled 2012-09-17: qty 60

## 2012-09-17 MED ORDER — HEPARIN SOD (PORK) LOCK FLUSH 100 UNIT/ML IV SOLN
500.0000 [IU] | Freq: Once | INTRAVENOUS | Status: AC | PRN
  Administered 2012-09-17: 500 [IU]
  Filled 2012-09-17: qty 5

## 2012-09-17 MED ORDER — DIPHENHYDRAMINE HCL 25 MG PO CAPS
50.0000 mg | ORAL_CAPSULE | Freq: Once | ORAL | Status: AC
  Administered 2012-09-17: 50 mg via ORAL

## 2012-09-17 MED ORDER — ACETAMINOPHEN 325 MG PO TABS
650.0000 mg | ORAL_TABLET | Freq: Once | ORAL | Status: AC
  Administered 2012-09-17: 650 mg via ORAL

## 2012-09-17 MED ORDER — SODIUM CHLORIDE 0.9 % IV SOLN
Freq: Once | INTRAVENOUS | Status: AC
  Administered 2012-09-17: 12:00:00 via INTRAVENOUS

## 2012-09-17 MED ORDER — SODIUM CHLORIDE 0.9 % IJ SOLN
10.0000 mL | INTRAMUSCULAR | Status: DC | PRN
  Administered 2012-09-17: 10 mL
  Filled 2012-09-17: qty 10

## 2012-09-17 NOTE — Telephone Encounter (Signed)
Per orders from 09-17-2012 add on lab and midlevel appointment for 12-03-2012   [facility] email to adjust patient's treatment time on the same day of 12-03-2012

## 2012-09-17 NOTE — Progress Notes (Signed)
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   CURRENT THERAPY: started maintenance Rituxan q20month x 2 years in Feb 2012.   INTERVAL HISTORY: Lauren Maxwell 78 y.o. female returns for regular follow up by herself.  She reports feeling relatively well.  She has mild fatigue; however, she is independent of all activities of daily living.   Patient denies fever, anorexia, weight loss, headache, visual changes, confusion, drenching night sweats, palpable lymph node swelling, mucositis, odynophagia, dysphagia, nausea vomiting, jaundice, chest pain, palpitation, shortness of breath, dyspnea on exertion, productive cough, gum bleeding, epistaxis, hematemesis, hemoptysis, abdominal pain, abdominal swelling, early satiety, melena, hematochezia, hematuria, skin rash, spontaneous bleeding, joint swelling, joint pain, heat or cold intolerance, bowel bladder incontinence, back pain, focal motor weakness, paresthesia, depression, suicidal or homicidal ideation, feeling hopelessness.    Past Medical History  Diagnosis Date  . Follicular lymphoma   . Smoking   . Hypertension   . Anxiety and depression   . Ascending aortic aneurysm     Past Surgical History  Procedure Date  . Tonsillectomy   . Colonoscopy   . Tubal ligation   . Thyroid nodule excision     Current Outpatient Prescriptions  Medication Sig Dispense Refill  . aspirin 81 MG tablet Take 81 mg by mouth every other day.       . lidocaine-prilocaine (EMLA) cream Apply topically as needed. Apply to [facility] site one hour prior to needle  access as needed.  30 g  2  . lisinopril (PRINIVIL,ZESTRIL) 20 MG tablet Take 20 mg by mouth 2 (two) times daily.       . Multiple Vitamin (MULTIVITAMIN) tablet Take 1 tablet by mouth every other day. Centrum Silver       . sodium chloride (OCEAN) 0.65 % nasal spray Place 1 spray into the nose as needed. For nasal congestion.      Marland Kitchen tiotropium (SPIRIVA) 18 MCG inhalation capsule Place 18 mcg into inhaler and inhale daily.         No current facility-administered medications for this visit.   Facility-Administered Medications Ordered in Other Visits  Medication Dose Route Frequency Provider Last Rate Last Dose  . 0.9 %  sodium chloride infusion   Intravenous Once Exie Parody, MD      . acetaminophen (TYLENOL) tablet 650 mg  650 mg Oral Once Exie Parody, MD   650 mg at 09/17/12 1211  . diphenhydrAMINE (BENADRYL) capsule 50 mg  50 mg Oral Once Exie Parody, MD   50 mg at 09/17/12 1211  . heparin lock flush 100 unit/mL  500 Units Intracatheter Once PRN Exie Parody, MD   500 Units at 09/17/12 1437  . riTUXimab (RITUXAN) 600 mg in sodium chloride 0.9 % 190 mL chemo infusion  375 mg/m2 (Treatment Plan Actual) Intravenous Once Exie Parody, MD   600 mg at 09/17/12 1308  . sodium chloride 0.9 % injection 10 mL  10 mL Intracatheter PRN Exie Parody, MD   10 mL at 09/17/12 1437    ALLERGIES:  is allergic to codeine.  REVIEW OF SYSTEMS:  The rest  of the 14-point review of system was negative.   Filed Vitals:   09/17/12 1011  BP: 144/86  Pulse: 79  Temp: 97.2 F (36.2 C)  Resp: 20   Wt Readings from Last 3 Encounters:  09/17/12 111 lb (50.349 kg)  07/16/12 111 lb 8 oz (50.576 kg)  05/16/12 110 lb 9.6 oz (50.168 kg)   ECOG Performance status: 1  PHYSICAL EXAMINATION:   General: thin-appearing woman,  in no acute distress.  Eyes:  no scleral icterus.  ENT:  There were no oropharyngeal lesions.  Neck was without thyromegaly.  Lymphatics:  Negative cervical, supraclavicular or axillary adenopathy.   Respiratory: lungs were clear bilaterally without wheezing or crackles.  Cardiovascular:  Regular rate and rhythm, S1/S2, without murmur, rub or gallop.  There was no pedal edema.  GI:  abdomen was soft, flat, nontender, nondistended, without organomegaly.  Muscoloskeletal:  no spinal tenderness of palpation of vertebral spine.  Skin exam was without echymosis, petichae.  Neuro exam was nonfocal.  Patient was able to get on and off exam table without assistance.  Gait was normal.  Patient was alerted and oriented.  Attention was good.   Language was appropriate.  Mood was normal without depression.  Speech was not pressured.  Thought content was not tangential.     LABORATORY/RADIOLOGY DATA:  Lab Results  Component Value Date   WBC 6.5 09/17/2012   HGB 14.5 09/17/2012   HCT 41.5 09/17/2012   PLT 182 09/17/2012   GLUCOSE 81 07/16/2012   CHOL 156 08/10/2010   TRIG 77 08/10/2010   HDL 67 08/10/2010   LDLCALC 74 08/10/2010   ALKPHOS 74 07/16/2012   ALT 18 07/16/2012   AST 24 07/16/2012   NA 134* 07/16/2012   K 4.1 07/16/2012   CL 100 07/16/2012   CREATININE 0.61 07/16/2012   BUN 9 07/16/2012   CO2 27 07/16/2012   INR 0.79 01/06/2012    ASSESSMENT AND PLAN:    1. Non-Hodgkin lymphoma: She is doing well with maintenance Rituxan q64month since 01/2011. Goal is for 2 years total. Her last surveillance CT scan did not show any recurrent lymphoma. I recommend proceeding with maintenance Rituxan dose today without dose modification. She expressed informed understanding and agreed to continue.  After today dose of Rituxan, she has one more dose in Dec 2013 which will be the last planned dose.  2. Smoking. Still smoking. She does not want to address this again today.  We discussed the risk of cancer, CAD, CVA with smoking.  She is not interested in stopping at this time.  3. History of aortic aneurysm. Stable size on this CT. We'll follow up on this on next surveillance CT for lymphoma.  4. Hypertension. She is on  Lisinopril per PCP.  5. Anxiety, depression: her mood was stable today. 6. Age-appropriate cancer screening:  She is not up to date with mammogram/ colonoscopy.  She wanted to delay these until 2014 when she is all finishes with Rituxan.   7. Follow up with [place] in 2 months before the last planned dose of Rituxan.

## 2012-09-17 NOTE — Telephone Encounter (Signed)
Per staff message I have adjusted appt for 12/30.  JMW

## 2012-09-17 NOTE — Patient Instructions (Signed)
[facility] Discharge Instructions for Patients Receiving Chemotherapy  Today you received the following chemotherapy agents Rituxan   To help prevent nausea and vomiting after your treatment, we encourage you to take your nausea medication as prescribed If you develop nausea and vomiting that is not controlled by your nausea medication, call the clinic. If it is after clinic hours your family physician or the after hours number for the clinic or go to the Emergency Department.   BELOW ARE SYMPTOMS THAT SHOULD BE REPORTED IMMEDIATELY:  *FEVER GREATER THAN 100.5 F  *CHILLS WITH OR WITHOUT FEVER  NAUSEA AND VOMITING THAT IS NOT CONTROLLED WITH YOUR NAUSEA MEDICATION  *UNUSUAL SHORTNESS OF BREATH  *UNUSUAL BRUISING OR BLEEDING  TENDERNESS IN MOUTH AND THROAT WITH OR WITHOUT PRESENCE OF ULCERS  *URINARY PROBLEMS  *BOWEL PROBLEMS  UNUSUAL RASH Items with * indicate a potential emergency and should be followed up as soon as possible.  One of the nurses will contact you 24 hours after your treatment. Please let the nurse know about any problems that you may have experienced. Feel free to call the clinic you have any questions or concerns. The clinic phone number is [phone].   I have been informed and understand all the instructions given to me. I know to contact the clinic, my physician, or go to the Emergency Department if any problems should occur. I do not have any questions at this time, but understand that I may call the clinic during office hours   should I have any questions or need assistance in obtaining follow up care.    __________________________________________  _____________  __________ Signature of Patient or Authorized Representative            Date                   Time    __________________________________________ Nurse's Signature    

## 2012-09-19 ENCOUNTER — Other Ambulatory Visit: Payer: Medicare Other | Admitting: Lab

## 2012-09-19 ENCOUNTER — Ambulatory Visit: Payer: Medicare Other | Admitting: Oncology

## 2012-12-02 ENCOUNTER — Other Ambulatory Visit: Payer: Self-pay | Admitting: Oncology

## 2012-12-03 ENCOUNTER — Other Ambulatory Visit (HOSPITAL_BASED_OUTPATIENT_CLINIC_OR_DEPARTMENT_OTHER): Payer: Medicare Other | Admitting: Lab

## 2012-12-03 ENCOUNTER — Encounter: Payer: Self-pay | Admitting: Oncology

## 2012-12-03 ENCOUNTER — Ambulatory Visit (HOSPITAL_BASED_OUTPATIENT_CLINIC_OR_DEPARTMENT_OTHER): Payer: Medicare Other

## 2012-12-03 ENCOUNTER — Ambulatory Visit (HOSPITAL_BASED_OUTPATIENT_CLINIC_OR_DEPARTMENT_OTHER): Payer: Medicare Other | Admitting: Oncology

## 2012-12-03 ENCOUNTER — Telehealth: Payer: Self-pay | Admitting: Oncology

## 2012-12-03 VITALS — BP 150/87 | HR 91 | Temp 97.0°F | Resp 20 | Ht 65.0 in | Wt 109.8 lb

## 2012-12-03 VITALS — BP 117/58 | HR 77 | Temp 97.9°F | Resp 16

## 2012-12-03 DIAGNOSIS — C8299 Follicular lymphoma, unspecified, extranodal and solid organ sites: Secondary | ICD-10-CM

## 2012-12-03 DIAGNOSIS — F17200 Nicotine dependence, unspecified, uncomplicated: Secondary | ICD-10-CM

## 2012-12-03 DIAGNOSIS — I719 Aortic aneurysm of unspecified site, without rupture: Secondary | ICD-10-CM

## 2012-12-03 DIAGNOSIS — C8290 Follicular lymphoma, unspecified, unspecified site: Secondary | ICD-10-CM

## 2012-12-03 DIAGNOSIS — Z5112 Encounter for antineoplastic immunotherapy: Secondary | ICD-10-CM

## 2012-12-03 DIAGNOSIS — F411 Generalized anxiety disorder: Secondary | ICD-10-CM

## 2012-12-03 LAB — CBC WITH DIFFERENTIAL/PLATELET
BASO%: 1 % (ref 0.0–2.0)
LYMPH%: 13.2 % — ABNORMAL LOW (ref 14.0–49.7)
MCHC: 34.7 g/dL (ref 31.5–36.0)
MONO#: 0.9 10*3/uL (ref 0.1–0.9)
NEUT#: 4.9 10*3/uL (ref 1.5–6.5)
Platelets: 210 10*3/uL (ref 145–400)
RBC: 4.33 10*6/uL (ref 3.70–5.45)
RDW: 13.1 % (ref 11.2–14.5)
WBC: 7.3 10*3/uL (ref 3.9–10.3)
lymph#: 1 10*3/uL (ref 0.9–3.3)
nRBC: 0 % (ref 0–0)

## 2012-12-03 LAB — COMPREHENSIVE METABOLIC PANEL (CC13)
ALT: 22 U/L (ref 0–55)
AST: 29 U/L (ref 5–34)
Calcium: 9.7 mg/dL (ref 8.4–10.4)
Chloride: 100 mEq/L (ref 98–107)
Creatinine: 0.8 mg/dL (ref 0.6–1.1)
Sodium: 136 mEq/L (ref 136–145)
Total Protein: 6.4 g/dL (ref 6.4–8.3)

## 2012-12-03 MED ORDER — SODIUM CHLORIDE 0.9 % IV SOLN
Freq: Once | INTRAVENOUS | Status: AC
  Administered 2012-12-03: 50 mL via INTRAVENOUS

## 2012-12-03 MED ORDER — SODIUM CHLORIDE 0.9 % IV SOLN
375.0000 mg/m2 | Freq: Once | INTRAVENOUS | Status: AC
  Administered 2012-12-03: 600 mg via INTRAVENOUS
  Filled 2012-12-03: qty 60

## 2012-12-03 MED ORDER — DIPHENHYDRAMINE HCL 25 MG PO CAPS
50.0000 mg | ORAL_CAPSULE | Freq: Once | ORAL | Status: AC
  Administered 2012-12-03: 50 mg via ORAL

## 2012-12-03 MED ORDER — HEPARIN SOD (PORK) LOCK FLUSH 100 UNIT/ML IV SOLN
500.0000 [IU] | Freq: Once | INTRAVENOUS | Status: AC | PRN
  Administered 2012-12-03: 500 [IU]
  Filled 2012-12-03: qty 5

## 2012-12-03 MED ORDER — SODIUM CHLORIDE 0.9 % IJ SOLN
10.0000 mL | INTRAMUSCULAR | Status: DC | PRN
  Administered 2012-12-03: 10 mL
  Filled 2012-12-03: qty 10

## 2012-12-03 MED ORDER — ACETAMINOPHEN 325 MG PO TABS
650.0000 mg | ORAL_TABLET | Freq: Once | ORAL | Status: AC
  Administered 2012-12-03: 650 mg via ORAL

## 2012-12-03 NOTE — Patient Instructions (Addendum)
[facility] Discharge Instructions for Patients Receiving Chemotherapy  Today you received the following chemotherapy agents Rituxan To help prevent nausea and vomiting after your treatment, we encourage you to take your nausea medication  As per Dr. Gaylyn Rong.  If you develop nausea and vomiting that is not controlled by your nausea medication, call the clinic. If it is after clinic hours your family physician or the after hours number for the clinic or go to the Emergency Department.   BELOW ARE SYMPTOMS THAT SHOULD BE REPORTED IMMEDIATELY:  *FEVER GREATER THAN 100.5 F  *CHILLS WITH OR WITHOUT FEVER  NAUSEA AND VOMITING THAT IS NOT CONTROLLED WITH YOUR NAUSEA MEDICATION  *UNUSUAL SHORTNESS OF BREATH  *UNUSUAL BRUISING OR BLEEDING  TENDERNESS IN MOUTH AND THROAT WITH OR WITHOUT PRESENCE OF ULCERS  *URINARY PROBLEMS  *BOWEL PROBLEMS  UNUSUAL RASH Items with * indicate a potential emergency and should be followed up as soon as possible.   Feel free to call the clinic you have any questions or concerns. The clinic phone number is [phone].   I have been informed and understand all the instructions given to me. I know to contact the clinic, my physician, or go to the Emergency Department if any problems should occur. I do not have any questions at this time, but understand that I may call the clinic during office hours   should I have any questions or need assistance in obtaining follow up care.    __________________________________________  _____________  __________ Signature of Patient or Authorized Representative            Date                   Time    __________________________________________ Nurse's Signature

## 2012-12-03 NOTE — Telephone Encounter (Signed)
Gave patient appt for labs, flush, and MD, also gave patient oral contrast and informed her that Radiology will call her to schedule CT

## 2012-12-03 NOTE — Progress Notes (Signed)
[facility]  [phone] Fax:(336) [phone]   OFFICE PROGRESS NOTE   Cc:  SHAW,KIMBERLEE, MD  DIAGNOSIS: Stage III, grade 1 non-Hodgkin's B-cell lymphoma (best described as follicular lymphoma); FLIPI score high risk (age >17; stage III; more than 4 nodal involvements)   PAST THERAPY: Bendamustine/Rituxan days 1 and 2 out of q28 days between 06/16/10 and 11/09/2010 from which she achieved complete remission.   CURRENT THERAPY: started maintenance Rituxan q76month x 2 years in Feb 2012.   INTERVAL HISTORY: Lauren Maxwell 78 y.o. female returns for regular follow up by herself.  She reports feeling relatively well.  She has mild fatigue; however, she is independent of all activities of daily living. Due for her last dose of Rituxan today.  Patient denies fever, anorexia, weight loss, headache, visual changes, confusion, drenching night sweats, palpable lymph node swelling, mucositis, odynophagia, dysphagia, nausea vomiting, jaundice, chest pain, palpitation, shortness of breath, dyspnea on exertion, productive cough, gum bleeding, epistaxis, hematemesis, hemoptysis, abdominal pain, abdominal swelling, early satiety, melena, hematochezia, hematuria, skin rash, spontaneous bleeding, joint swelling, joint pain, heat or cold intolerance, bowel bladder incontinence, back pain, focal motor weakness, paresthesia, depression, suicidal or homicidal ideation, feeling hopelessness.    Past Medical History  Diagnosis Date  . Follicular lymphoma   . Smoking   . Hypertension   . Anxiety and depression   . Ascending aortic aneurysm     Past Surgical History  Procedure Date  . Tonsillectomy   . Colonoscopy   . Tubal ligation   . Thyroid nodule excision     Current Outpatient Prescriptions  Medication Sig Dispense Refill  . aspirin 81 MG tablet Take 81 mg by mouth every other day.       . lidocaine-prilocaine (EMLA) cream Apply topically as needed. Apply to  [facility] site one hour prior to needle access as needed.  30 g  2  . lisinopril (PRINIVIL,ZESTRIL) 20 MG tablet Take 20 mg by mouth 2 (two) times daily.       . Multiple Vitamin (MULTIVITAMIN) tablet Take 1 tablet by mouth every other day. Centrum Silver       . sodium chloride (OCEAN) 0.65 % nasal spray Place 1 spray into the nose as needed. For nasal congestion.      Marland Kitchen tiotropium (SPIRIVA) 18 MCG inhalation capsule Place 18 mcg into inhaler and inhale daily.          ALLERGIES:  is allergic to codeine.  REVIEW OF SYSTEMS:  The rest of the 14-point review of system was negative.   Filed Vitals:   12/03/12 0916  BP: 150/87  Pulse: 91  Temp: 97 F (36.1 C)  Resp: 20   Wt Readings from Last 3 Encounters:  12/03/12 109 lb 12.8 oz (49.805 kg)  09/17/12 111 lb (50.349 kg)  07/16/12 111 lb 8 oz (50.576 kg)   ECOG Performance status: 1  PHYSICAL EXAMINATION:   General: thin-appearing woman,  in no acute distress.  Eyes:  no scleral icterus.  ENT:  There were no oropharyngeal lesions.  Neck was without thyromegaly.  Lymphatics:  Negative cervical, supraclavicular or axillary adenopathy.  Respiratory: lungs were clear bilaterally without wheezing or crackles.  Cardiovascular:  Regular rate and rhythm, S1/S2, without murmur, rub or gallop.  There was no pedal edema.  GI:  abdomen was soft, flat, nontender, nondistended, without organomegaly.  Muscoloskeletal:  no spinal tenderness of palpation of vertebral spine.  Skin exam was without echymosis, petichae.  Neuro exam  was nonfocal.  Patient was able to get on and off exam table without assistance.  Gait was normal.  Patient was alerted and oriented.  Attention was good.   Language was appropriate.  Mood was normal without depression.  Speech was not pressured.  Thought content was not tangential.     LABORATORY/RADIOLOGY DATA:  Lab Results  Component Value Date   WBC 7.3 12/03/2012   HGB 14.5 12/03/2012   HCT 41.8 12/03/2012   PLT 210  12/03/2012   GLUCOSE 87 12/03/2012   CHOL 156 08/10/2010   TRIG 77 08/10/2010   HDL 67 08/10/2010   LDLCALC 74 08/10/2010   ALKPHOS 100 12/03/2012   ALT 22 12/03/2012   AST 29 12/03/2012   NA 136 12/03/2012   K 4.4 12/03/2012   CL 100 12/03/2012   CREATININE 0.8 12/03/2012   BUN 12.0 12/03/2012   CO2 24 12/03/2012   INR 0.79 01/06/2012    ASSESSMENT AND PLAN:    1. Non-Hodgkin lymphoma: She is doing well with maintenance Rituxan q71month since 01/2011. Goal is for 2 years total. Her last surveillance CT scan did not show any recurrent lymphoma. I recommend proceeding with maintenance Rituxan dose today without dose modification. This is the last planned dose of Rituxan today. Next surveillance CT is due June 2014 which I have ordered today. 2. Smoking. Still smoking. She does not want to address this again today.  We discussed the risk of cancer, CAD, CVA with smoking.  She is not interested in stopping at this time.  3. History of aortic aneurysm. Stable size on this CT. We will follow up on this on next surveillance CT for lymphoma.  4. Hypertension. She is on Lisinopril per PCP.  5. Anxiety, depression: her mood was stable today. 6. Age-appropriate cancer screening:  She is not up to date with mammogram/ colonoscopy.  She wanted to delay these until 2014 when she is finished with Rituxan.   7. Follow up: 6 months with surveillance CT 1-2 days prior to visit. PAC flush ever 2 months.

## 2013-02-01 ENCOUNTER — Ambulatory Visit (HOSPITAL_BASED_OUTPATIENT_CLINIC_OR_DEPARTMENT_OTHER): Payer: Medicare Other

## 2013-02-01 VITALS — BP 175/78 | HR 78 | Temp 97.5°F

## 2013-02-01 DIAGNOSIS — C8299 Follicular lymphoma, unspecified, extranodal and solid organ sites: Secondary | ICD-10-CM

## 2013-02-01 DIAGNOSIS — C8598 Non-Hodgkin lymphoma, unspecified, lymph nodes of multiple sites: Secondary | ICD-10-CM

## 2013-02-01 DIAGNOSIS — Z452 Encounter for adjustment and management of vascular access device: Secondary | ICD-10-CM

## 2013-02-01 MED ORDER — SODIUM CHLORIDE 0.9 % IJ SOLN
10.0000 mL | INTRAMUSCULAR | Status: DC | PRN
  Administered 2013-02-01: 10 mL via INTRAVENOUS
  Filled 2013-02-01: qty 10

## 2013-02-01 MED ORDER — HEPARIN SOD (PORK) LOCK FLUSH 100 UNIT/ML IV SOLN
500.0000 [IU] | Freq: Once | INTRAVENOUS | Status: AC
  Administered 2013-02-01: 500 [IU] via INTRAVENOUS
  Filled 2013-02-01: qty 5

## 2013-02-01 NOTE — Patient Instructions (Signed)
Call MD for problems 

## 2013-02-13 ENCOUNTER — Telehealth: Payer: Self-pay | Admitting: Oncology

## 2013-02-13 NOTE — Telephone Encounter (Signed)
lmonvm adviisng the pt of her next flush appt in April and to pick up the rest of her appt schedules at that time.

## 2013-03-22 ENCOUNTER — Ambulatory Visit (HOSPITAL_BASED_OUTPATIENT_CLINIC_OR_DEPARTMENT_OTHER): Payer: Medicare Other

## 2013-03-22 VITALS — BP 132/82 | HR 95 | Temp 97.0°F

## 2013-03-22 DIAGNOSIS — Z452 Encounter for adjustment and management of vascular access device: Secondary | ICD-10-CM

## 2013-03-22 DIAGNOSIS — C8299 Follicular lymphoma, unspecified, extranodal and solid organ sites: Secondary | ICD-10-CM

## 2013-03-22 DIAGNOSIS — C8590 Non-Hodgkin lymphoma, unspecified, unspecified site: Secondary | ICD-10-CM

## 2013-03-22 MED ORDER — SODIUM CHLORIDE 0.9 % IJ SOLN
10.0000 mL | INTRAMUSCULAR | Status: DC | PRN
  Administered 2013-03-22: 10 mL via INTRAVENOUS
  Filled 2013-03-22: qty 10

## 2013-03-22 MED ORDER — HEPARIN SOD (PORK) LOCK FLUSH 100 UNIT/ML IV SOLN
500.0000 [IU] | Freq: Once | INTRAVENOUS | Status: AC
  Administered 2013-03-22: 500 [IU] via INTRAVENOUS
  Filled 2013-03-22: qty 5

## 2013-03-22 NOTE — Patient Instructions (Signed)
See MD for problems

## 2013-03-26 ENCOUNTER — Telehealth: Payer: Self-pay | Admitting: *Deleted

## 2013-03-26 NOTE — Telephone Encounter (Signed)
Pt called to request changes to her schedule in June.  She has PAC flush due w/i first 2 weeks of June and CT scan at end of June.  She would like to move CT scan up to coincide w/ timing of PAC needing flushed.  Requests labs drawn from [facility] and CT needle left in for scan that same day.  Sent POF to move lab/CT appt up w/ a flush appt and see Dr. Gaylyn Rong next day.

## 2013-03-28 ENCOUNTER — Telehealth: Payer: Self-pay | Admitting: *Deleted

## 2013-03-28 NOTE — Telephone Encounter (Signed)
sw pt gv appt for lab, flush, and CT scheduled for 05/15/13. I also informed the pt that I will mail her a letter/cal as well.Lauren Maxwell

## 2013-05-15 ENCOUNTER — Ambulatory Visit (HOSPITAL_BASED_OUTPATIENT_CLINIC_OR_DEPARTMENT_OTHER): Payer: Medicare Other

## 2013-05-15 ENCOUNTER — Other Ambulatory Visit (HOSPITAL_BASED_OUTPATIENT_CLINIC_OR_DEPARTMENT_OTHER): Payer: Medicare Other | Admitting: Lab

## 2013-05-15 ENCOUNTER — Ambulatory Visit (HOSPITAL_COMMUNITY)
Admission: RE | Admit: 2013-05-15 | Discharge: 2013-05-15 | Disposition: A | Discharge status: Home / Self Care | Payer: Medicare Other | Source: Ambulatory Visit | Attending: Oncology | Admitting: Oncology

## 2013-05-15 VITALS — BP 182/91 | HR 75 | Temp 97.1°F | Resp 20

## 2013-05-15 DIAGNOSIS — K8020 Calculus of gallbladder without cholecystitis without obstruction: Secondary | ICD-10-CM | POA: Insufficient documentation

## 2013-05-15 DIAGNOSIS — N200 Calculus of kidney: Secondary | ICD-10-CM | POA: Insufficient documentation

## 2013-05-15 DIAGNOSIS — M899 Disorder of bone, unspecified: Secondary | ICD-10-CM | POA: Insufficient documentation

## 2013-05-15 DIAGNOSIS — C8290 Follicular lymphoma, unspecified, unspecified site: Secondary | ICD-10-CM

## 2013-05-15 DIAGNOSIS — C8299 Follicular lymphoma, unspecified, extranodal and solid organ sites: Secondary | ICD-10-CM

## 2013-05-15 DIAGNOSIS — Z452 Encounter for adjustment and management of vascular access device: Secondary | ICD-10-CM

## 2013-05-15 DIAGNOSIS — C8589 Other specified types of non-Hodgkin lymphoma, extranodal and solid organ sites: Secondary | ICD-10-CM | POA: Insufficient documentation

## 2013-05-15 DIAGNOSIS — I2510 Atherosclerotic heart disease of native coronary artery without angina pectoris: Secondary | ICD-10-CM | POA: Insufficient documentation

## 2013-05-15 DIAGNOSIS — Q762 Congenital spondylolisthesis: Secondary | ICD-10-CM | POA: Insufficient documentation

## 2013-05-15 DIAGNOSIS — J438 Other emphysema: Secondary | ICD-10-CM | POA: Insufficient documentation

## 2013-05-15 LAB — CBC WITH DIFFERENTIAL/PLATELET
BASO%: 1.1 % (ref 0.0–2.0)
EOS%: 3.7 % (ref 0.0–7.0)
MCH: 34.5 pg — ABNORMAL HIGH (ref 25.1–34.0)
MCHC: 34.3 g/dL (ref 31.5–36.0)
RBC: 4.29 10*6/uL (ref 3.70–5.45)
RDW: 13.8 % (ref 11.2–14.5)
lymph#: 0.7 10*3/uL — ABNORMAL LOW (ref 0.9–3.3)

## 2013-05-15 LAB — COMPREHENSIVE METABOLIC PANEL (CC13)
ALT: 18 U/L (ref 0–55)
AST: 26 U/L (ref 5–34)
Albumin: 4.1 g/dL (ref 3.5–5.0)
Alkaline Phosphatase: 90 U/L (ref 40–150)
Calcium: 9.4 mg/dL (ref 8.4–10.4)
Chloride: 97 mEq/L — ABNORMAL LOW (ref 98–107)
Potassium: 4.3 mEq/L (ref 3.5–5.1)

## 2013-05-15 MED ORDER — SODIUM CHLORIDE 0.9 % IJ SOLN
10.0000 mL | INTRAMUSCULAR | Status: DC | PRN
  Administered 2013-05-15: 10 mL via INTRAVENOUS
  Filled 2013-05-15: qty 10

## 2013-05-15 MED ORDER — HEPARIN SOD (PORK) LOCK FLUSH 100 UNIT/ML IV SOLN
500.0000 [IU] | Freq: Once | INTRAVENOUS | Status: AC
  Administered 2013-05-15: 500 [IU] via INTRAVENOUS
  Filled 2013-05-15: qty 5

## 2013-05-15 MED ORDER — IOHEXOL 300 MG/ML  SOLN
100.0000 mL | Freq: Once | INTRAMUSCULAR | Status: AC | PRN
  Administered 2013-05-15: 100 mL via INTRAVENOUS

## 2013-05-17 ENCOUNTER — Ambulatory Visit (HOSPITAL_BASED_OUTPATIENT_CLINIC_OR_DEPARTMENT_OTHER): Payer: Medicare Other | Admitting: Oncology

## 2013-05-17 ENCOUNTER — Telehealth: Payer: Self-pay | Admitting: Oncology

## 2013-05-17 VITALS — BP 148/85 | HR 85 | Temp 97.2°F | Resp 18 | Ht 65.0 in | Wt 112.1 lb

## 2013-05-17 DIAGNOSIS — F341 Dysthymic disorder: Secondary | ICD-10-CM

## 2013-05-17 DIAGNOSIS — I10 Essential (primary) hypertension: Secondary | ICD-10-CM

## 2013-05-17 DIAGNOSIS — I719 Aortic aneurysm of unspecified site, without rupture: Secondary | ICD-10-CM

## 2013-05-17 DIAGNOSIS — C8290 Follicular lymphoma, unspecified, unspecified site: Secondary | ICD-10-CM

## 2013-05-17 DIAGNOSIS — F17200 Nicotine dependence, unspecified, uncomplicated: Secondary | ICD-10-CM

## 2013-05-17 DIAGNOSIS — C8299 Follicular lymphoma, unspecified, extranodal and solid organ sites: Secondary | ICD-10-CM

## 2013-05-17 NOTE — Telephone Encounter (Signed)
gv and printed appt sched and avs for pt  °

## 2013-05-17 NOTE — Progress Notes (Signed)
[facility]  Telephone:(336) [phone] Fax:(336) [phone]   OFFICE PROGRESS NOTE   Cc:  SHAW,KIMBERLEE, MD  DIAGNOSIS: Stage III, grade 1 non-Hodgkin's B-cell lymphoma (best described as follicular lymphoma); FLIPI score high risk (age >71; stage III; more than 4 nodal involvements)   PAST THERAPY: Bendamustine/Rituxan days 1 and 2 out of q28 days between 06/16/10 and 11/09/2010 from which she achieved complete remission. She was on maintenance Rituxan q96month x 2 years between Feb 2012 and  Dec 2013.   CURRENT THERAPY: observation.    INTERVAL HISTORY: Lauren Maxwell 79 y.o. female returns for regular follow up by herself.  She reports doing well.  She does not feel depressed any more compared to when we first met.   Patient denies fever, anorexia, weight loss, fatigue, headache, visual changes, confusion, drenching night sweats, palpable lymph node swelling, mucositis, odynophagia, dysphagia, nausea vomiting, jaundice, chest pain, palpitation, shortness of breath, dyspnea on exertion, productive cough, gum bleeding, epistaxis, hematemesis, hemoptysis, abdominal pain, abdominal swelling, early satiety, melena, hematochezia, hematuria, skin rash, spontaneous bleeding, joint swelling, joint pain, heat or cold intolerance, bowel bladder incontinence, back pain, focal motor weakness, paresthesia.     Past Medical History  Diagnosis Date  . Follicular lymphoma   . Smoking   . Hypertension   . Anxiety and depression   . Ascending aortic aneurysm     Past Surgical History  Procedure Laterality Date  . Tonsillectomy    . Colonoscopy    . Tubal ligation    . Thyroid nodule excision      Current Outpatient Prescriptions  Medication Sig Dispense Refill  . aspirin 81 MG tablet Take 81 mg by mouth every other day.       . lisinopril (PRINIVIL,ZESTRIL) 20 MG tablet Take 20 mg by mouth 2 (two) times daily.       . Multiple Vitamin (MULTIVITAMIN) tablet Take 1 tablet by  mouth every other day. Centrum Silver       . sodium chloride (OCEAN) 0.65 % nasal spray Place 1 spray into the nose as needed. For nasal congestion.      Marland Kitchen tiotropium (SPIRIVA) 18 MCG inhalation capsule Place 18 mcg into inhaler and inhale daily.         No current facility-administered medications for this visit.    ALLERGIES:  is allergic to codeine.  REVIEW OF SYSTEMS:  The rest of the 14-point review of system was negative.   Filed Vitals:   05/17/13 1020  BP: 148/85  Pulse: 85  Temp: 97.2 F (36.2 C)  Resp: 18   Wt Readings from Last 3 Encounters:  05/17/13 112 lb 1.6 oz (50.848 kg)  12/03/12 109 lb 12.8 oz (49.805 kg)  09/17/12 111 lb (50.349 kg)   ECOG Performance status: 0-1  PHYSICAL EXAMINATION:   General: thin-appearing woman,  in no acute distress.  Eyes:  no scleral icterus.  ENT:  There were no oropharyngeal lesions.  Neck was without thyromegaly.  Lymphatics:  Negative cervical, supraclavicular or axillary adenopathy.  Respiratory: lungs were clear bilaterally without wheezing or crackles.  Cardiovascular:  Regular rate and rhythm, S1/S2, without murmur, rub or gallop.  There was no pedal edema.  GI:  abdomen was soft, flat, nontender, nondistended, without organomegaly.  Muscoloskeletal:  no spinal tenderness of palpation of vertebral spine.  Skin exam was without echymosis, petichae.  Neuro exam was nonfocal.  Patient was able to get on and off exam table without assistance.  Gait was  normal.  Patient was alerted and oriented.  Attention was good.   Language was appropriate.  Mood was normal without depression.  Speech was not pressured.  Thought content was not tangential.     LABORATORY/RADIOLOGY DATA:  Lab Results  Component Value Date   WBC 6.1 05/15/2013   HGB 14.8 05/15/2013   HCT 43.1 05/15/2013   PLT 172 05/15/2013   GLUCOSE 94 05/15/2013   CHOL 156 08/10/2010   TRIG 77 08/10/2010   HDL 67 08/10/2010   LDLCALC 74 08/10/2010   ALKPHOS 90 05/15/2013   ALT 18  05/15/2013   AST 26 05/15/2013   NA 132* 05/15/2013   K 4.3 05/15/2013   CL 97* 05/15/2013   CREATININE 0.7 05/15/2013   BUN 10.9 05/15/2013   CO2 26 05/15/2013   INR 0.79 01/06/2012    RADIOLOGY:  CT CHEST  Findings: Lung windows demonstrate moderate centrilobular  emphysema. Biapical pleural parenchymal scarring which is mild.  Soft tissue windows demonstrate no supraclavicular adenopathy. A  right-sided Port-A-Cath which terminates at the cavoatrial junction  on this supine exam. No axillary adenopathy. Surgical changes  about the right lobe of the thyroid. Ascending aorta upper normal  in size, unchanged. 4.0 cm. Normal heart size without pericardial  or pleural effusion. No central pulmonary embolism, on this non-  dedicated study. No mediastinal or hilar adenopathy. Multivessel  coronary artery atherosclerosis. A pectus excavatum deformity.  IMPRESSION:  1. No acute process or evidence of active lymphoma within the  chest.  2. Similar borderline aneurysmal dilatation of the ascending  aorta.  3. Coronary artery atherosclerosis.  CT ABDOMEN AND PELVIS  Findings: Prominence of the right lobe of the liver, with a shape  suggestive of a Reidel's lobe. Focal steatosis adjacent the  falciform ligament. Normal spleen, stomach, pancreas. Gallstones  without acute cholecystitis or biliary ductal dilatation.  Normal adrenal glands and right kidney. A left-sided renal  collecting system calculus.  Atherosclerosis of the origin of the left renal artery. Small  stable retroperitoneal nodes, without adenopathy. Normal colon,  appendix, and terminal ileum. Normal small bowel without abdominal  ascites.  No pelvic adenopathy. Normal urinary bladder and uterus, without  adnexal mass or significant free pelvic fluid.  Moderate osteopenia. Right-sided L5 pars defect. Trace L5-S1  anterolisthesis. Accentuation of expected thoracic kyphosis.  IMPRESSION:  1. No acute process or evidence of  active lymphoma within the  abdomen or pelvis.  2. Left nephrolithiasis.  3. Cholelithiasis.    ASSESSMENT AND PLAN:    1. Non-Hodgkin lymphoma: continue to be in remission.  As she has low grade lymphoma, new data does not support routine surveillance CT scan without symptoms.  I recommended watchful observation and defer CT if there are concerning findings.  She agreed with this.  2. Smoking. Still smoking. She does not want to stop.  3. History of aortic aneurysm. Stable size on this CT.  I again advised her on BP control.  4. Hypertension. She is on Lisinopril per PCP.  5. Anxiety, depression: her mood was stable today. 6. Follow up in about 6 months.     I informed Ms. Bettes that I am leaving the practice.  [facility] to see another provider when she returns.    The length of time of the face-to-face encounter was 10 minutes. More than 50% of time was spent counseling and coordination of care.     [place] T. Gaylyn Rong, M.D.

## 2013-05-31 ENCOUNTER — Other Ambulatory Visit (HOSPITAL_COMMUNITY): Payer: Medicare Other

## 2013-05-31 ENCOUNTER — Other Ambulatory Visit: Payer: Medicare Other

## 2013-06-03 ENCOUNTER — Ambulatory Visit: Payer: Medicare Other | Admitting: Oncology

## 2013-07-10 ENCOUNTER — Ambulatory Visit (HOSPITAL_BASED_OUTPATIENT_CLINIC_OR_DEPARTMENT_OTHER): Payer: Medicare Other

## 2013-07-10 VITALS — BP 145/87 | HR 86 | Temp 97.6°F | Resp 20

## 2013-07-10 DIAGNOSIS — Z452 Encounter for adjustment and management of vascular access device: Secondary | ICD-10-CM

## 2013-07-10 DIAGNOSIS — C73 Malignant neoplasm of thyroid gland: Secondary | ICD-10-CM

## 2013-07-10 MED ORDER — SODIUM CHLORIDE 0.9 % IJ SOLN
10.0000 mL | INTRAMUSCULAR | Status: DC | PRN
  Administered 2013-07-10: 10 mL via INTRAVENOUS
  Filled 2013-07-10: qty 10

## 2013-07-10 MED ORDER — HEPARIN SOD (PORK) LOCK FLUSH 100 UNIT/ML IV SOLN
500.0000 [IU] | Freq: Once | INTRAVENOUS | Status: AC
  Administered 2013-07-10: 500 [IU] via INTRAVENOUS
  Filled 2013-07-10: qty 5

## 2013-07-10 NOTE — Patient Instructions (Addendum)

## 2013-09-04 ENCOUNTER — Ambulatory Visit (HOSPITAL_BASED_OUTPATIENT_CLINIC_OR_DEPARTMENT_OTHER): Payer: Medicare Other

## 2013-09-04 VITALS — BP 159/82 | HR 77 | Temp 98.1°F | Resp 20

## 2013-09-04 DIAGNOSIS — C8299 Follicular lymphoma, unspecified, extranodal and solid organ sites: Secondary | ICD-10-CM

## 2013-09-04 DIAGNOSIS — Z452 Encounter for adjustment and management of vascular access device: Secondary | ICD-10-CM

## 2013-09-04 DIAGNOSIS — C8290 Follicular lymphoma, unspecified, unspecified site: Secondary | ICD-10-CM

## 2013-09-04 MED ORDER — HEPARIN SOD (PORK) LOCK FLUSH 100 UNIT/ML IV SOLN
500.0000 [IU] | Freq: Once | INTRAVENOUS | Status: AC
  Administered 2013-09-04: 500 [IU] via INTRAVENOUS
  Filled 2013-09-04: qty 5

## 2013-09-04 MED ORDER — SODIUM CHLORIDE 0.9 % IJ SOLN
10.0000 mL | INTRAMUSCULAR | Status: DC | PRN
  Administered 2013-09-04: 10 mL via INTRAVENOUS
  Filled 2013-09-04: qty 10

## 2013-10-30 ENCOUNTER — Ambulatory Visit (HOSPITAL_BASED_OUTPATIENT_CLINIC_OR_DEPARTMENT_OTHER): Payer: Medicare Other

## 2013-10-30 ENCOUNTER — Encounter (INDEPENDENT_AMBULATORY_CARE_PROVIDER_SITE_OTHER): Payer: Self-pay

## 2013-10-30 VITALS — BP 153/78 | HR 83 | Temp 96.9°F

## 2013-10-30 DIAGNOSIS — C8589 Other specified types of non-Hodgkin lymphoma, extranodal and solid organ sites: Secondary | ICD-10-CM

## 2013-10-30 DIAGNOSIS — C8590 Non-Hodgkin lymphoma, unspecified, unspecified site: Secondary | ICD-10-CM

## 2013-10-30 DIAGNOSIS — Z452 Encounter for adjustment and management of vascular access device: Secondary | ICD-10-CM

## 2013-10-30 MED ORDER — HEPARIN SOD (PORK) LOCK FLUSH 100 UNIT/ML IV SOLN
500.0000 [IU] | Freq: Once | INTRAVENOUS | Status: AC
  Administered 2013-10-30: 500 [IU] via INTRAVENOUS
  Filled 2013-10-30: qty 5

## 2013-10-30 MED ORDER — SODIUM CHLORIDE 0.9 % IJ SOLN
10.0000 mL | INTRAMUSCULAR | Status: DC | PRN
  Administered 2013-10-30: 10 mL via INTRAVENOUS
  Filled 2013-10-30: qty 10

## 2013-10-30 NOTE — Patient Instructions (Signed)
Implanted Port Instructions  An implanted port is a central line that has a round shape and is placed under the skin. It is used for long-term IV (intravenous) access for:  · Medicine.  · Fluids.  · Liquid nutrition, such as TPN (total parenteral nutrition).  · Blood samples.  Ports can be placed:  · In the chest area just below the collarbone (this is the most common place.)  · In the arms.  · In the belly (abdomen) area.  · In the legs.  PARTS OF THE PORT  A port has 2 main parts:  · The reservoir. The reservoir is round, disc-shaped, and will be a small, raised area under your skin.  · The reservoir is the part where a needle is inserted (accessed) to either give medicines or to draw blood.  · The catheter. The catheter is a long, slender tube that extends from the reservoir. The catheter is placed into a large vein.  · Medicine that is inserted into the reservoir goes into the catheter and then into the vein.  INSERTION OF THE PORT  · The port is surgically placed in either an operating room or in a procedural area (interventional radiology).  · Medicine may be given to help you relax during the procedure.  · The skin where the port will be inserted is numbed (local anesthetic).  · 1 or 2 small cuts (incisions) will be made in the skin to insert the port.  · The port can be used after it has been inserted.  INCISION SITE CARE  · The incision site may have small adhesive strips on it. This helps keep the incision site closed. Sometimes, no adhesive strips are placed. Instead of adhesive strips, a special kind of surgical glue is used to keep the incision closed.  · If adhesive strips were placed on the incision sites, do not take them off. They will fall off on their own.  · The incision site may be sore for 1 to 2 days. Pain medicine can help.  · Do not get the incision site wet. Bathe or shower as directed by your caregiver.  · The incision site should heal in 5 to 7 days. A small scar may form after the  incision has healed.  ACCESSING THE PORT  Special steps must be taken to access the port:  · Before the port is accessed, a numbing cream can be placed on the skin. This helps numb the skin over the port site.  · A sterile technique is used to access the port.  · The port is accessed with a needle. Only "non-coring" port needles should be used to access the port. Once the port is accessed, a blood return should be checked. This helps ensure the port is in the vein and is not clogged (clotted).  · If your caregiver believes your port should remain accessed, a clear (transparent) bandage will be placed over the needle site. The bandage and needle will need to be changed every week or as directed by your caregiver.  · Keep the bandage covering the needle clean and dry. Do not get it wet. Follow your caregiver's instructions on how to take a shower or bath when the port is accessed.  · If your port does not need to stay accessed, no bandage is needed over the port.  FLUSHING THE PORT  Flushing the port keeps it from getting clogged. How often the port is flushed depends on:  · If a   constant infusion is running. If a constant infusion is running, the port may not need to be flushed.  · If intermittent medicines are given.  · If the port is not being used.  For intermittent medicines:  · The port will need to be flushed:  · After medicines have been given.  · After blood has been drawn.  · As part of routine maintenance.  · A port is normally flushed with:  · Normal saline.  · Heparin.  · Follow your caregiver's advice on how often, how much, and the type of flush to use on your port.  IMPORTANT PORT INFORMATION  · Tell your caregiver if you are allergic to heparin.  · After your port is placed, you will get a manufacturer's information card. The card has information about your port. Keep this card with you at all times.  · There are many types of ports available. Know what kind of port you have.  · In case of an  emergency, it may be helpful to wear a medical alert bracelet. This can help alert health care workers that you have a port.  · The port can stay in for as long as your caregiver believes it is necessary.  · When it is time for the port to come out, surgery will be done to remove it. The surgery will be similar to how the port was put in.  · If you are in the hospital or clinic:  · Your port will be taken care of and flushed by a nurse.  · If you are at home:  · A home health care nurse may give medicines and take care of the port.  · You or a family member can get special training and directions for giving medicine and taking care of the port at home.  SEEK IMMEDIATE MEDICAL CARE IF:   · Your port does not flush or you are unable to get a blood return.  · New drainage or pus is coming from the incision.  · A bad smell is coming from the incision site.  · You develop swelling or increased redness at the incision site.  · You develop increased swelling or pain at the port site.  · You develop swelling or pain in the surrounding skin near the port.  · You have an oral temperature above 102° F (38.9° C), not controlled by medicine.  MAKE SURE YOU:   · Understand these instructions.  · Will watch your condition.  · Will get help right away if you are not doing well or get worse.  Document Released: 11/21/2005 Document Revised: 02/13/2012 Document Reviewed: 02/12/2009  [employer]® Patient Information ©2014 [employer].

## 2013-11-18 ENCOUNTER — Telehealth: Payer: Self-pay | Admitting: Hematology and Oncology

## 2013-11-18 ENCOUNTER — Ambulatory Visit: Payer: Medicare Other | Admitting: Oncology

## 2013-11-18 ENCOUNTER — Other Ambulatory Visit: Payer: Medicare Other | Admitting: Lab

## 2013-11-18 ENCOUNTER — Other Ambulatory Visit (HOSPITAL_BASED_OUTPATIENT_CLINIC_OR_DEPARTMENT_OTHER): Payer: Medicare Other

## 2013-11-18 ENCOUNTER — Ambulatory Visit (HOSPITAL_BASED_OUTPATIENT_CLINIC_OR_DEPARTMENT_OTHER): Payer: Medicare Other | Admitting: Hematology and Oncology

## 2013-11-18 ENCOUNTER — Other Ambulatory Visit: Payer: Self-pay | Admitting: Hematology and Oncology

## 2013-11-18 ENCOUNTER — Encounter: Payer: Self-pay | Admitting: Hematology and Oncology

## 2013-11-18 VITALS — BP 161/90 | HR 90 | Temp 98.0°F | Resp 18 | Ht 65.0 in | Wt 110.5 lb

## 2013-11-18 DIAGNOSIS — C8290 Follicular lymphoma, unspecified, unspecified site: Secondary | ICD-10-CM

## 2013-11-18 DIAGNOSIS — C8291 Follicular lymphoma, unspecified, lymph nodes of head, face, and neck: Secondary | ICD-10-CM

## 2013-11-18 DIAGNOSIS — I10 Essential (primary) hypertension: Secondary | ICD-10-CM

## 2013-11-18 DIAGNOSIS — M810 Age-related osteoporosis without current pathological fracture: Secondary | ICD-10-CM

## 2013-11-18 DIAGNOSIS — M069 Rheumatoid arthritis, unspecified: Secondary | ICD-10-CM

## 2013-11-18 DIAGNOSIS — Z800 Family history of malignant neoplasm of digestive organs: Secondary | ICD-10-CM

## 2013-11-18 DIAGNOSIS — F17200 Nicotine dependence, unspecified, uncomplicated: Secondary | ICD-10-CM

## 2013-11-18 LAB — CBC WITH DIFFERENTIAL/PLATELET
BASO%: 1.1 % (ref 0.0–2.0)
EOS%: 3.3 % (ref 0.0–7.0)
Eosinophils Absolute: 0.2 10*3/uL (ref 0.0–0.5)
HCT: 43.5 % (ref 34.8–46.6)
LYMPH%: 11.4 % — ABNORMAL LOW (ref 14.0–49.7)
MCH: 34.2 pg — ABNORMAL HIGH (ref 25.1–34.0)
MCHC: 33.9 g/dL (ref 31.5–36.0)
MCV: 100.7 fL (ref 79.5–101.0)
MONO#: 0.8 10*3/uL (ref 0.1–0.9)
NEUT%: 72.4 % (ref 38.4–76.8)
Platelets: 179 10*3/uL (ref 145–400)
WBC: 7.2 10*3/uL (ref 3.9–10.3)

## 2013-11-18 LAB — COMPREHENSIVE METABOLIC PANEL (CC13)
ALT: 21 U/L (ref 0–55)
AST: 27 U/L (ref 5–34)
Anion Gap: 11 mEq/L (ref 3–11)
CO2: 21 mEq/L — ABNORMAL LOW (ref 22–29)
Creatinine: 0.7 mg/dL (ref 0.6–1.1)
Glucose: 107 mg/dl (ref 70–140)
Total Bilirubin: 0.93 mg/dL (ref 0.20–1.20)

## 2013-11-18 LAB — LACTATE DEHYDROGENASE (CC13): LDH: 204 U/L (ref 125–245)

## 2013-11-18 NOTE — Telephone Encounter (Signed)
appts made per 12/15 POF AVS and CAL mailed to pt shh

## 2013-11-18 NOTE — Progress Notes (Signed)
[facility] OFFICE PROGRESS NOTE  Patient Care Team: Lupita Raider, MD as PCP - General (Family Medicine) Petra Kuba, MD (Gastroenterology) Artis Delay, MD as Consulting Physician (Hematology and Oncology)  DIAGNOSIS: History of follicular lymphoma grade 1  SUMMARY OF ONCOLOGIC HISTORY: This is a pleasant Lauren Maxwell who was discovered to have lymphoma after she felt a lump on her neck. Biopsy on June 6011 confirmed non-Hodgkin lymphoma, CD20 positive, of follicular lymphoma subtype. Bone marrow biopsy was negative. She received bendamustine with rituximab from July 2011 11/05/2010 and achieved complete remission. Subsequently, she was placed on maintenance rituximab from February 2012 to December 2013. Her last CT scan from June 2014 was negative for recurrence  INTERVAL HISTORY: Lauren Maxwell 79 y.o. female returns for further followup. She denies any palpable lumps. She denies any recent fever, chills, night sweats or abnormal weight loss She is up-to-date with her preventive screening test. Her next colonoscopy is due this month. She received influenza vaccination. The patient continued to smoke. She's not willing to quit smoking. I have reviewed the past medical history, past surgical history, social history and family history with the patient and they are unchanged from previous note.  ALLERGIES:  is allergic to codeine.  MEDICATIONS:  Current Outpatient Prescriptions  Medication Sig Dispense Refill  . aspirin 81 MG tablet Take 81 mg by mouth every other day.       . lisinopril (PRINIVIL,ZESTRIL) 20 MG tablet Take 20 mg by mouth 2 (two) times daily.       . Multiple Vitamin (MULTIVITAMIN) tablet Take 1 tablet by mouth every other day. Centrum Silver       . sodium chloride (OCEAN) 0.65 % nasal spray Place 1 spray into the nose as needed. For nasal congestion.      Marland Kitchen tiotropium (SPIRIVA) 18 MCG inhalation capsule Place 18 mcg into inhaler and inhale daily.          No current facility-administered medications for this visit.    REVIEW OF SYSTEMS:   Constitutional: Denies fevers, chills or abnormal weight loss Eyes: Denies blurriness of vision Ears, nose, mouth, throat, and face: Denies mucositis or sore throat Respiratory: Denies cough, dyspnea or wheezes Cardiovascular: Denies palpitation, chest discomfort or lower extremity swelling Gastrointestinal:  Denies nausea, heartburn or change in bowel habits Skin: Denies abnormal skin rashes Lymphatics: Denies new lymphadenopathy or easy bruising Neurological:Denies numbness, tingling or new weaknesses Behavioral/Psych: Mood is stable, no new changes  All other systems were reviewed with the patient and are negative.  PHYSICAL EXAMINATION: ECOG PERFORMANCE STATUS: 0 - Asymptomatic  Filed Vitals:   11/18/13 1137  BP: 161/90  Pulse: 90  Temp: 98 F (36.7 C)  Resp: 18   Filed Weights   11/18/13 1137  Weight: 110 lb 8 oz (50.122 kg)    GENERAL:alert, no distress and comfortable she looks thin and cachectic SKIN: skin color, texture, turgor are normal, no rashes or significant lesions EYES: normal, Conjunctiva are pink and non-injected, sclera clear OROPHARYNX:no exudate, no erythema and lips, buccal mucosa, and tongue normal  NECK: supple, thyroid normal size, non-tender, without nodularity LYMPH:  no palpable lymphadenopathy in the cervical, axillary or inguinal LUNGS: clear to auscultation and percussion with normal breathing effort HEART: regular rate & rhythm and no murmurs and no lower extremity edema ABDOMEN:abdomen soft, non-tender and normal bowel sounds no splenomegaly Musculoskeletal:no cyanosis of digits and no clubbing . Noted swan neck deformity consistent with chronic rheumatoid arthritis NEURO: alert & oriented  x 3 with fluent speech, no focal motor/sensory deficits  LABORATORY DATA:  I have reviewed the data as listed    Component Value Date/Time   NA 135* 11/18/2013  1101   NA 134* 07/16/2012 0921   NA 133 11/17/2011 0753   K 3.7 11/18/2013 1101   K 4.1 07/16/2012 0921   K 4.2 11/17/2011 0753   CL 97* 05/15/2013 0926   CL 100 07/16/2012 0921   CL 96* 11/17/2011 0753   CO2 21* 11/18/2013 1101   CO2 27 07/16/2012 0921   CO2 28 11/17/2011 0753   GLUCOSE 107 11/18/2013 1101   GLUCOSE 94 05/15/2013 0926   GLUCOSE 81 07/16/2012 0921   GLUCOSE 98 11/17/2011 0753   BUN 14.3 11/18/2013 1101   BUN 9 07/16/2012 0921   BUN 13 11/17/2011 0753   CREATININE 0.7 11/18/2013 1101   CREATININE 0.61 07/16/2012 0921   CREATININE 0.7 11/17/2011 0753   CALCIUM 10.0 11/18/2013 1101   CALCIUM 9.6 07/16/2012 0921   CALCIUM 9.6 11/17/2011 0753   PROT 6.7 11/18/2013 1101   PROT 6.1 07/16/2012 0921   PROT 7.4 11/17/2011 0753   ALBUMIN 4.1 11/18/2013 1101   ALBUMIN 4.4 07/16/2012 0921   AST 27 11/18/2013 1101   AST 24 07/16/2012 0921   AST 31 11/17/2011 0753   ALT 21 11/18/2013 1101   ALT 18 07/16/2012 0921   ALT 24 11/17/2011 0753   ALKPHOS 84 11/18/2013 1101   ALKPHOS 74 07/16/2012 0921   ALKPHOS 93* 11/17/2011 0753   BILITOT 0.93 11/18/2013 1101   BILITOT 0.9 07/16/2012 0921   BILITOT 1.00 11/17/2011 0753   GFRNONAA 82* 01/06/2012 0857   GFRAA >90 01/06/2012 0857    No results found for this basename: SPEP, UPEP,  kappa and lambda light chains    Lab Results  Component Value Date   WBC 7.2 11/18/2013   NEUTROABS 5.2 11/18/2013   HGB 14.8 11/18/2013   HCT 43.5 11/18/2013   MCV 100.7 11/18/2013   PLT 179 11/18/2013      Chemistry      Component Value Date/Time   NA 135* 11/18/2013 1101   NA 134* 07/16/2012 0921   NA 133 11/17/2011 0753   K 3.7 11/18/2013 1101   K 4.1 07/16/2012 0921   K 4.2 11/17/2011 0753   CL 97* 05/15/2013 0926   CL 100 07/16/2012 0921   CL 96* 11/17/2011 0753   CO2 21* 11/18/2013 1101   CO2 27 07/16/2012 0921   CO2 28 11/17/2011 0753   BUN 14.3 11/18/2013 1101   BUN 9 07/16/2012 0921   BUN 13 11/17/2011 0753   CREATININE 0.7 11/18/2013  1101   CREATININE 0.61 07/16/2012 0921   CREATININE 0.7 11/17/2011 0753      Component Value Date/Time   CALCIUM 10.0 11/18/2013 1101   CALCIUM 9.6 07/16/2012 0921   CALCIUM 9.6 11/17/2011 0753   ALKPHOS 84 11/18/2013 1101   ALKPHOS 74 07/16/2012 0921   ALKPHOS 93* 11/17/2011 0753   AST 27 11/18/2013 1101   AST 24 07/16/2012 0921   AST 31 11/17/2011 0753   ALT 21 11/18/2013 1101   ALT 18 07/16/2012 0921   ALT 24 11/17/2011 0753   BILITOT 0.93 11/18/2013 1101   BILITOT 0.9 07/16/2012 0921   BILITOT 1.00 11/17/2011 0753      ASSESSMENT & PLAN:  #1 follicular lymphoma There is no evidence of disease recurrence. I recommend removing her port. She agreed. I do not recommend routine surveillance  imaging study unless there are signs and symptoms to suggest disease recurrence. I educated about signs watch out for such as unexplained weight loss, new lumps or new night sweats. #2 rheumatoid arthritis She has classic findings on exam but that does not bother the patient #3 smoking I spent some time educating her about the importance of nicotine cessation. She is not interested to quit. #4 significant high blood pressure #5 history of aortic aneurysm She is on lisinopril. According to the patient, she is aggravated today to due to scheduling issues #6 preventive care She has strong family history of colon cancer. Her colonoscopy is due and of this month and she has made arrangements. She is up-to-date with influenza vaccination. I recommend she take extra vitamin D supplement #6 History of osteoporosis The patient had intermittent back pain. I recommend additional vitamin D supplement. Orders Placed This Encounter  Procedures  . IR REMOVAL TUN ACCESS W/ PORT W/O FL MOD SED    Standing Status: Future     Number of Occurrences:      Standing Expiration Date: 01/19/2015    Order Specific Question:  Reason for Exam (SYMPTOM  OR DIAGNOSIS REQUIRED)    Answer:  no need port    Order Specific  Question:  Preferred Imaging Location?    Answer:  [facility]  . Morphology    Standing Status: Future     Number of Occurrences:      Standing Expiration Date: 11/18/2014  . CBC with Differential    Standing Status: Future     Number of Occurrences:      Standing Expiration Date: 08/10/2014  . Comprehensive metabolic panel    Standing Status: Future     Number of Occurrences:      Standing Expiration Date: 11/18/2014  . Lactate dehydrogenase    Standing Status: Future     Number of Occurrences:      Standing Expiration Date: 11/18/2014   All questions were answered. The patient knows to call the clinic with any problems, questions or concerns. No barriers to learning was detected. I spent 25 minutes counseling the patient face to face. The total time spent in the appointment was 40 minutes and more than 50% was on counseling and review of test results     [facility], Lya Holben, MD 11/18/2013 12:03 PM

## 2013-11-20 ENCOUNTER — Telehealth: Payer: Self-pay | Admitting: *Deleted

## 2013-11-20 NOTE — Telephone Encounter (Signed)
Pt left VM asking if she needs to have her Port flushed prior to having it removed.  She also requests copy of her recent lab work.   Called pt back and left VM informing it is not necessary to have port flushed prior to being removed.  Also informed her I placed copy of her labs in mail to her home address.  Asked her to call back if any more questions.

## 2013-12-02 ENCOUNTER — Telehealth: Payer: Self-pay | Admitting: *Deleted

## 2013-12-02 NOTE — Telephone Encounter (Signed)
Pt canceled her appt for Port Flush at end of January since she is having her Port removed next week.

## 2013-12-09 ENCOUNTER — Encounter (HOSPITAL_COMMUNITY): Payer: Self-pay | Admitting: Pharmacy Technician

## 2013-12-09 ENCOUNTER — Other Ambulatory Visit: Payer: Self-pay | Admitting: Radiology

## 2013-12-10 ENCOUNTER — Ambulatory Visit (HOSPITAL_COMMUNITY)
Admission: RE | Admit: 2013-12-10 | Discharge: 2013-12-10 | Disposition: A | Discharge status: Home / Self Care | Payer: Medicare Other | Source: Ambulatory Visit | Attending: Hematology and Oncology | Admitting: Hematology and Oncology

## 2013-12-10 ENCOUNTER — Encounter (HOSPITAL_COMMUNITY): Payer: Self-pay

## 2013-12-10 DIAGNOSIS — C8290 Follicular lymphoma, unspecified, unspecified site: Secondary | ICD-10-CM

## 2013-12-10 DIAGNOSIS — F17200 Nicotine dependence, unspecified, uncomplicated: Secondary | ICD-10-CM | POA: Insufficient documentation

## 2013-12-10 DIAGNOSIS — Z452 Encounter for adjustment and management of vascular access device: Secondary | ICD-10-CM | POA: Insufficient documentation

## 2013-12-10 DIAGNOSIS — Z7982 Long term (current) use of aspirin: Secondary | ICD-10-CM | POA: Insufficient documentation

## 2013-12-10 DIAGNOSIS — C8299 Follicular lymphoma, unspecified, extranodal and solid organ sites: Secondary | ICD-10-CM | POA: Insufficient documentation

## 2013-12-10 DIAGNOSIS — I10 Essential (primary) hypertension: Secondary | ICD-10-CM | POA: Insufficient documentation

## 2013-12-10 DIAGNOSIS — Z79899 Other long term (current) drug therapy: Secondary | ICD-10-CM | POA: Insufficient documentation

## 2013-12-10 MED ORDER — MIDAZOLAM HCL 2 MG/2ML IJ SOLN
INTRAMUSCULAR | Status: AC | PRN
  Administered 2013-12-10 (×2): 1 mg via INTRAVENOUS

## 2013-12-10 MED ORDER — CEFAZOLIN SODIUM-DEXTROSE 2-3 GM-% IV SOLR
2.0000 g | Freq: Once | INTRAVENOUS | Status: AC
  Administered 2013-12-10: 2 g via INTRAVENOUS
  Filled 2013-12-10: qty 50

## 2013-12-10 MED ORDER — FENTANYL CITRATE 0.05 MG/ML IJ SOLN
INTRAMUSCULAR | Status: AC
  Filled 2013-12-10: qty 4

## 2013-12-10 MED ORDER — SODIUM CHLORIDE 0.9 % IV SOLN
INTRAVENOUS | Status: DC
Start: 2013-12-10 — End: 2013-12-11
  Administered 2013-12-10: 11:00:00 via INTRAVENOUS

## 2013-12-10 MED ORDER — MIDAZOLAM HCL 2 MG/2ML IJ SOLN
INTRAMUSCULAR | Status: AC
  Filled 2013-12-10: qty 4

## 2013-12-10 MED ORDER — FENTANYL CITRATE 0.05 MG/ML IJ SOLN
INTRAMUSCULAR | Status: AC | PRN
  Administered 2013-12-10 (×2): 50 ug via INTRAVENOUS

## 2013-12-10 NOTE — H&P (Signed)
Lauren Maxwell is an 80 y.o. female.   Chief Complaint: "I'm getting my port out" HPI: Patient with history of follicular lymphoma, currently in remission, presents today for port a cath removal.  Past Medical History  Diagnosis Date  . Follicular lymphoma   . Smoking   . Hypertension   . Anxiety and depression   . Ascending aortic aneurysm     Past Surgical History  Procedure Laterality Date  . Tonsillectomy    . Colonoscopy    . Tubal ligation    . Thyroid nodule excision      Family History  Problem Relation Age of Onset  . Cancer Mother 51    colon cancer  . Cancer Maternal Aunt     multiple aunts have colon cancer   Social History:  reports that she has been smoking.  She does not have any smokeless tobacco history on file. She reports that she drinks about 3.6 ounces of alcohol per week. She reports that she does not use illicit drugs.  Allergies:  Allergies  Allergen Reactions  . Codeine Nausea And Vomiting    Current outpatient prescriptions:aspirin 81 MG tablet, Take 81 mg by mouth every other day. , Disp: , Rfl: ;  lisinopril (PRINIVIL,ZESTRIL) 20 MG tablet, Take 20 mg by mouth 2 (two) times daily. , Disp: , Rfl: ;  Multiple Vitamin (MULTIVITAMIN) tablet, Take 1 tablet by mouth every other day. Centrum Silver , Disp: , Rfl: ;  omega-3 acid ethyl esters (LOVAZA) 1 G capsule, Take 1 g by mouth daily., Disp: , Rfl:  sodium chloride (OCEAN) 0.65 % nasal spray, Place 1 spray into the nose as needed. For nasal congestion., Disp: , Rfl: ;  tiotropium (SPIRIVA) 18 MCG inhalation capsule, Place 18 mcg into inhaler and inhale daily.  , Disp: , Rfl:  Current facility-administered medications:0.9 %  sodium chloride infusion, , Intravenous, Continuous, Ascencion Dike, PA-C;  ceFAZolin (ANCEF) IVPB 2 g/50 mL premix, 2 g, Intravenous, Once, [employer], PA-C;  fentaNYL (SUBLIMAZE) 0.05 MG/ML injection, , , , ;  midazolam (VERSED) 2 MG/2ML injection, , , ,   No results found for  this or any previous visit (from the past 48 hour(s)). No results found.  Review of Systems  Constitutional: Negative for fever and chills.  Respiratory: Negative for shortness of breath.        Occ dry cough  Cardiovascular: Negative for chest pain.  Gastrointestinal: Negative for nausea, vomiting and abdominal pain.  Musculoskeletal: Negative for back pain.  Neurological: Negative for headaches.  Endo/Heme/Allergies: Does not bruise/bleed easily.    Blood pressure 159/84, pulse 95, temperature 97.9 F (36.6 C), temperature source Oral, resp. rate 16, SpO2 98.00%. Physical Exam  Constitutional: She is oriented to person, place, and time.  Thin WF in NAD  Cardiovascular: Normal rate and regular rhythm.   Respiratory: Effort normal and breath sounds normal.  Clean, intact rt chest wall PAC  GI: Soft. Bowel sounds are normal. There is no tenderness.  Musculoskeletal: Normal range of motion. She exhibits no edema.  Neurological: She is alert and oriented to person, place, and time.     Assessment/Plan:  Pt with hx of follicular lymphoma, currently in remission. Plan is for port a cath removal today. Details/risks of procedure d/w pt with her understanding and consent.  ALLRED,D KEVIN 12/10/2013, 10:35 AM

## 2013-12-10 NOTE — Procedures (Signed)
Interventional Radiology Procedure Note  Procedure: Portacath removed. Complications: None Recommendations: - Bandage in place x 24 hrs - May DC home  Signed,  Criselda Peaches, MD [facility]

## 2014-03-20 ENCOUNTER — Other Ambulatory Visit: Payer: Self-pay | Admitting: Gastroenterology

## 2014-05-16 ENCOUNTER — Telehealth: Payer: Self-pay | Admitting: *Deleted

## 2014-05-16 NOTE — Telephone Encounter (Signed)
Pt confirmed her appts for Monday and asked if she needs to fast for labs.  Informed her she does not need to fast for our labs. She verbalized understanding.

## 2014-05-19 ENCOUNTER — Encounter: Payer: Self-pay | Admitting: Hematology and Oncology

## 2014-05-19 ENCOUNTER — Other Ambulatory Visit (HOSPITAL_BASED_OUTPATIENT_CLINIC_OR_DEPARTMENT_OTHER): Payer: Medicare Other

## 2014-05-19 ENCOUNTER — Ambulatory Visit (HOSPITAL_BASED_OUTPATIENT_CLINIC_OR_DEPARTMENT_OTHER): Payer: Medicare Other | Admitting: Hematology and Oncology

## 2014-05-19 VITALS — BP 135/73 | HR 77 | Temp 97.1°F | Resp 18 | Ht 65.0 in | Wt 111.0 lb

## 2014-05-19 DIAGNOSIS — Z87898 Personal history of other specified conditions: Secondary | ICD-10-CM

## 2014-05-19 DIAGNOSIS — C8290 Follicular lymphoma, unspecified, unspecified site: Secondary | ICD-10-CM

## 2014-05-19 DIAGNOSIS — F17200 Nicotine dependence, unspecified, uncomplicated: Secondary | ICD-10-CM

## 2014-05-19 DIAGNOSIS — K130 Diseases of lips: Secondary | ICD-10-CM

## 2014-05-19 LAB — CBC WITH DIFFERENTIAL/PLATELET
BASO%: 0.5 % (ref 0.0–2.0)
Basophils Absolute: 0 10*3/uL (ref 0.0–0.1)
EOS%: 4.6 % (ref 0.0–7.0)
Eosinophils Absolute: 0.3 10*3/uL (ref 0.0–0.5)
HCT: 40.8 % (ref 34.8–46.6)
HGB: 14.3 g/dL (ref 11.6–15.9)
LYMPH#: 0.9 10*3/uL (ref 0.9–3.3)
LYMPH%: 14.7 % (ref 14.0–49.7)
MCH: 33.6 pg (ref 25.1–34.0)
MCHC: 35 g/dL (ref 31.5–36.0)
MCV: 95.8 fL (ref 79.5–101.0)
MONO#: 0.7 10*3/uL (ref 0.1–0.9)
MONO%: 11.1 % (ref 0.0–14.0)
NEUT#: 4.2 10*3/uL (ref 1.5–6.5)
NEUT%: 69.1 % (ref 38.4–76.8)
PLATELETS: 191 10*3/uL (ref 145–400)
RBC: 4.26 10*6/uL (ref 3.70–5.45)
RDW: 13.2 % (ref 11.2–14.5)
WBC: 6.1 10*3/uL (ref 3.9–10.3)

## 2014-05-19 LAB — COMPREHENSIVE METABOLIC PANEL (CC13)
ALK PHOS: 81 U/L (ref 40–150)
ALT: 18 U/L (ref 0–55)
AST: 23 U/L (ref 5–34)
Albumin: 3.9 g/dL (ref 3.5–5.0)
Anion Gap: 8 mEq/L (ref 3–11)
BILIRUBIN TOTAL: 0.79 mg/dL (ref 0.20–1.20)
BUN: 13.3 mg/dL (ref 7.0–26.0)
CO2: 24 mEq/L (ref 22–29)
Calcium: 9.1 mg/dL (ref 8.4–10.4)
Chloride: 102 mEq/L (ref 98–109)
Creatinine: 0.8 mg/dL (ref 0.6–1.1)
Glucose: 115 mg/dl (ref 70–140)
Potassium: 3.8 mEq/L (ref 3.5–5.1)
Sodium: 134 mEq/L — ABNORMAL LOW (ref 136–145)
Total Protein: 6.2 g/dL — ABNORMAL LOW (ref 6.4–8.3)

## 2014-05-19 LAB — MORPHOLOGY
PLT EST: ADEQUATE
RBC Comments: NORMAL

## 2014-05-19 LAB — LACTATE DEHYDROGENASE (CC13): LDH: 194 U/L (ref 125–245)

## 2014-05-19 NOTE — Progress Notes (Signed)
[facility] OFFICE PROGRESS NOTE  Patient Care Team: Mayra Neer, MD as PCP - General (Family Medicine) Jeryl Columbia, MD (Gastroenterology) Heath Lark, MD as Consulting Physician (Hematology and Oncology)  SUMMARY OF ONCOLOGIC HISTORY: This is a pleasant lady who was discovered to have lymphoma after she felt a lump on her neck. Biopsy on June 6011 confirmed non-Hodgkin lymphoma, CD20 positive, of follicular lymphoma subtype. Bone marrow biopsy was negative. She received bendamustine with rituximab from July 2011 11/05/2010 and achieved complete remission. Subsequently, she was placed on maintenance rituximab from February 2012 to December 2013. Her last CT scan from June 2014 was negative for recurrence  INTERVAL HISTORY: Please see below for problem oriented charting. Complaint of the lip lesion that appeared 12 days ago and it is slightly improving. She denies any recent fever, chills, night sweats or abnormal weight loss She denies new lymphadenopathy. REVIEW OF SYSTEMS:   Constitutional: Denies fevers, chills or abnormal weight loss Eyes: Denies blurriness of vision Ears, nose, mouth, throat, and face: Denies mucositis or sore throat Respiratory: Denies cough, dyspnea or wheezes Cardiovascular: Denies palpitation, chest discomfort or lower extremity swelling Gastrointestinal:  Denies nausea, heartburn or change in bowel habits Skin: Denies abnormal skin rashes Lymphatics: Denies new lymphadenopathy or easy bruising Neurological:Denies numbness, tingling or new weaknesses Behavioral/Psych: Mood is stable, no new changes  All other systems were reviewed with the patient and are negative.  I have reviewed the past medical history, past surgical history, social history and family history with the patient and they are unchanged from previous note.  ALLERGIES:  is allergic to codeine.  MEDICATIONS:  Current Outpatient Prescriptions  Medication Sig Dispense Refill   . aspirin 81 MG tablet Take 81 mg by mouth every other day.       . lisinopril (PRINIVIL,ZESTRIL) 20 MG tablet Take 20 mg by mouth 2 (two) times daily.       . Multiple Vitamin (MULTIVITAMIN) tablet Take 1 tablet by mouth every other day. Centrum Silver       . omega-3 acid ethyl esters (LOVAZA) 1 G capsule Take 1 g by mouth daily.      . sodium chloride (OCEAN) 0.65 % nasal spray Place 1 spray into the nose as needed. For nasal congestion.      Marland Kitchen tiotropium (SPIRIVA) 18 MCG inhalation capsule Place 18 mcg into inhaler and inhale daily.         No current facility-administered medications for this visit.    PHYSICAL EXAMINATION: ECOG PERFORMANCE STATUS: 0 - Asymptomatic  Filed Vitals:   05/19/14 1124  BP: 135/73  Pulse: 77  Temp: 97.1 F (36.2 C)  Resp: 18   Filed Weights   05/19/14 1124  Weight: 111 lb (50.349 kg)    GENERAL:alert, no distress and comfortable SKIN: skin color, texture, turgor are normal, no rashes or significant lesions EYES: normal, Conjunctiva are pink and non-injected, sclera clear OROPHARYNX:no exudate, normal buccal mucosa, and tongue normal. There are 2 lesions on the lip could be related to recent herpes simplex. NECK: supple, thyroid normal size, non-tender, without nodularity LYMPH:  no palpable lymphadenopathy in the cervical, axillary or inguinal LUNGS: clear to auscultation and percussion with normal breathing effort HEART: regular rate & rhythm and no murmurs and no lower extremity edema ABDOMEN:abdomen soft, non-tender and normal bowel sounds Musculoskeletal:no cyanosis of digits and no clubbing  NEURO: alert & oriented x 3 with fluent speech, no focal motor/sensory deficits  LABORATORY DATA:  I have  reviewed the data as listed    Component Value Date/Time   NA 134* 05/19/2014 1054   NA 134* 07/16/2012 0921   NA 133 11/17/2011 0753   K 3.8 05/19/2014 1054   K 4.1 07/16/2012 0921   K 4.2 11/17/2011 0753   CL 97* 05/15/2013 0926   CL 100  07/16/2012 0921   CL 96* 11/17/2011 0753   CO2 24 05/19/2014 1054   CO2 27 07/16/2012 0921   CO2 28 11/17/2011 0753   GLUCOSE 115 05/19/2014 1054   GLUCOSE 94 05/15/2013 0926   GLUCOSE 81 07/16/2012 0921   GLUCOSE 98 11/17/2011 0753   BUN 13.3 05/19/2014 1054   BUN 9 07/16/2012 0921   BUN 13 11/17/2011 0753   CREATININE 0.8 05/19/2014 1054   CREATININE 0.61 07/16/2012 0921   CREATININE 0.7 11/17/2011 0753   CALCIUM 9.1 05/19/2014 1054   CALCIUM 9.6 07/16/2012 0921   CALCIUM 9.6 11/17/2011 0753   PROT 6.2* 05/19/2014 1054   PROT 6.1 07/16/2012 0921   PROT 7.4 11/17/2011 0753   ALBUMIN 3.9 05/19/2014 1054   ALBUMIN 4.4 07/16/2012 0921   AST 23 05/19/2014 1054   AST 24 07/16/2012 0921   AST 31 11/17/2011 0753   ALT 18 05/19/2014 1054   ALT 18 07/16/2012 0921   ALT 24 11/17/2011 0753   ALKPHOS 81 05/19/2014 1054   ALKPHOS 74 07/16/2012 0921   ALKPHOS 93* 11/17/2011 0753   BILITOT 0.79 05/19/2014 1054   BILITOT 0.9 07/16/2012 0921   BILITOT 1.00 11/17/2011 0753   GFRNONAA 82* 01/06/2012 0857   GFRAA >90 01/06/2012 0857    No results found for this basename: SPEP,  UPEP,   kappa and lambda light chains    Lab Results  Component Value Date   WBC 6.1 05/19/2014   NEUTROABS 4.2 05/19/2014   HGB 14.3 05/19/2014   HCT 40.8 05/19/2014   MCV 95.8 05/19/2014   PLT 191 05/19/2014      Chemistry      Component Value Date/Time   NA 134* 05/19/2014 1054   NA 134* 07/16/2012 0921   NA 133 11/17/2011 0753   K 3.8 05/19/2014 1054   K 4.1 07/16/2012 0921   K 4.2 11/17/2011 0753   CL 97* 05/15/2013 0926   CL 100 07/16/2012 0921   CL 96* 11/17/2011 0753   CO2 24 05/19/2014 1054   CO2 27 07/16/2012 0921   CO2 28 11/17/2011 0753   BUN 13.3 05/19/2014 1054   BUN 9 07/16/2012 0921   BUN 13 11/17/2011 0753   CREATININE 0.8 05/19/2014 1054   CREATININE 0.61 07/16/2012 0921   CREATININE 0.7 11/17/2011 0753      Component Value Date/Time   CALCIUM 9.1 05/19/2014 1054   CALCIUM 9.6 07/16/2012 0921   CALCIUM 9.6  11/17/2011 0753   ALKPHOS 81 05/19/2014 1054   ALKPHOS 74 07/16/2012 0921   ALKPHOS 93* 11/17/2011 0753   AST 23 05/19/2014 1054   AST 24 07/16/2012 0921   AST 31 11/17/2011 0753   ALT 18 05/19/2014 1054   ALT 18 07/16/2012 0921   ALT 24 11/17/2011 0753   BILITOT 0.79 05/19/2014 1054   BILITOT 0.9 07/16/2012 0921   BILITOT 1.00 11/17/2011 0753      ASSESSMENT & PLAN:  Follicular lymphoma Clinically, she has no evidence of recurrence. I recommend history, physical examination and blood work in 6 months.  Smoking I spent some time counseling the patient the importance of tobacco cessation. she is  not interested to quit now.   Lip lesion Medically, it appears benign. I discussed with her the role of antiviral medications and she ultimately declined.    Orders Placed This Encounter  Procedures  . Comprehensive metabolic panel    Standing Status: Future     Number of Occurrences:      Standing Expiration Date: 05/19/2015  . CBC with Differential    Standing Status: Future     Number of Occurrences:      Standing Expiration Date: 05/19/2015  . Lactate dehydrogenase    Standing Status: Future     Number of Occurrences:      Standing Expiration Date: 05/19/2015   All questions were answered. The patient knows to call the clinic with any problems, questions or concerns. No barriers to learning was detected. I spent 15 minutes counseling the patient face to face. The total time spent in the appointment was 20 minutes and more than 50% was on counseling and review of test results     [facility], [place], MD 05/19/2014 9:36 PM

## 2014-05-19 NOTE — Assessment & Plan Note (Signed)
Clinically, she has no evidence of recurrence. I recommend history, physical examination and blood work in 6 months.

## 2014-05-19 NOTE — Assessment & Plan Note (Signed)
Medically, it appears benign. I discussed with her the role of antiviral medications and she ultimately declined.

## 2014-05-19 NOTE — Assessment & Plan Note (Signed)
I spent some time counseling the patient the importance of tobacco cessation. she is not interested to quit now.

## 2014-05-21 ENCOUNTER — Telehealth: Payer: Self-pay | Admitting: Hematology and Oncology

## 2014-05-21 NOTE — Telephone Encounter (Signed)
, °

## 2014-05-28 ENCOUNTER — Telehealth: Payer: Self-pay | Admitting: *Deleted

## 2014-05-28 NOTE — Telephone Encounter (Signed)
Most recent labs mailed to patient per her request

## 2014-10-01 ENCOUNTER — Other Ambulatory Visit: Payer: Self-pay | Admitting: Nurse Practitioner

## 2015-04-17 ENCOUNTER — Telehealth: Payer: Self-pay | Admitting: *Deleted

## 2015-04-17 NOTE — Telephone Encounter (Signed)
TC from patient this am regarding upcoming appts with Dr. Alvy Bimler. Reviewed her appts with her. No other needs identified.

## 2015-05-21 ENCOUNTER — Telehealth: Payer: Self-pay | Admitting: *Deleted

## 2015-05-21 ENCOUNTER — Other Ambulatory Visit: Payer: Self-pay | Admitting: Hematology and Oncology

## 2015-05-21 ENCOUNTER — Other Ambulatory Visit: Payer: Self-pay | Admitting: *Deleted

## 2015-05-21 DIAGNOSIS — C8290 Follicular lymphoma, unspecified, unspecified site: Secondary | ICD-10-CM

## 2015-05-21 NOTE — Telephone Encounter (Signed)
PT. ASKED IF SHE NEEDED TO FAST BEFORE HER LAB WORK. INFORMED PT. THERE IS NO NEED TO FAST BEFORE LAB WORK.

## 2015-05-22 ENCOUNTER — Encounter (HOSPITAL_COMMUNITY): Payer: Self-pay | Admitting: Emergency Medicine

## 2015-05-22 ENCOUNTER — Ambulatory Visit (HOSPITAL_BASED_OUTPATIENT_CLINIC_OR_DEPARTMENT_OTHER): Payer: Medicare Other | Admitting: Hematology and Oncology

## 2015-05-22 ENCOUNTER — Other Ambulatory Visit (HOSPITAL_BASED_OUTPATIENT_CLINIC_OR_DEPARTMENT_OTHER): Payer: Medicare Other

## 2015-05-22 ENCOUNTER — Other Ambulatory Visit: Payer: Self-pay

## 2015-05-22 ENCOUNTER — Emergency Department (HOSPITAL_COMMUNITY): Payer: Medicare Other

## 2015-05-22 ENCOUNTER — Telehealth: Payer: Self-pay | Admitting: Hematology and Oncology

## 2015-05-22 ENCOUNTER — Emergency Department (HOSPITAL_COMMUNITY)
Admission: EM | Admit: 2015-05-22 | Discharge: 2015-05-22 | Disposition: A | Discharge status: Home / Self Care | Payer: Medicare Other | Attending: Emergency Medicine | Admitting: Emergency Medicine

## 2015-05-22 ENCOUNTER — Encounter: Payer: Self-pay | Admitting: Hematology and Oncology

## 2015-05-22 VITALS — BP 131/88 | HR 136 | Temp 98.0°F | Resp 20 | Ht 65.0 in | Wt 110.6 lb

## 2015-05-22 DIAGNOSIS — Z8572 Personal history of non-Hodgkin lymphomas: Secondary | ICD-10-CM | POA: Diagnosis present

## 2015-05-22 DIAGNOSIS — I480 Paroxysmal atrial fibrillation: Secondary | ICD-10-CM | POA: Insufficient documentation

## 2015-05-22 DIAGNOSIS — Z720 Tobacco use: Secondary | ICD-10-CM | POA: Diagnosis not present

## 2015-05-22 DIAGNOSIS — Z8579 Personal history of other malignant neoplasms of lymphoid, hematopoietic and related tissues: Secondary | ICD-10-CM | POA: Diagnosis not present

## 2015-05-22 DIAGNOSIS — C8290 Follicular lymphoma, unspecified, unspecified site: Secondary | ICD-10-CM

## 2015-05-22 DIAGNOSIS — I10 Essential (primary) hypertension: Secondary | ICD-10-CM | POA: Diagnosis not present

## 2015-05-22 DIAGNOSIS — Z8659 Personal history of other mental and behavioral disorders: Secondary | ICD-10-CM | POA: Diagnosis not present

## 2015-05-22 DIAGNOSIS — Z79899 Other long term (current) drug therapy: Secondary | ICD-10-CM | POA: Diagnosis not present

## 2015-05-22 DIAGNOSIS — I4891 Unspecified atrial fibrillation: Secondary | ICD-10-CM

## 2015-05-22 DIAGNOSIS — F17200 Nicotine dependence, unspecified, uncomplicated: Secondary | ICD-10-CM

## 2015-05-22 LAB — CBC WITH DIFFERENTIAL/PLATELET
BASO%: 1.1 % (ref 0.0–2.0)
Basophils Absolute: 0.1 10*3/uL (ref 0.0–0.1)
Basophils Absolute: 0.1 10*3/uL (ref 0.0–0.1)
Basophils Relative: 1 % (ref 0–1)
EOS PCT: 3 % (ref 0–5)
EOS%: 4.1 % (ref 0.0–7.0)
Eosinophils Absolute: 0.3 10*3/uL (ref 0.0–0.5)
Eosinophils Absolute: 0.3 10*3/uL (ref 0.0–0.7)
HCT: 46.6 % (ref 34.8–46.6)
HCT: 47.5 % — ABNORMAL HIGH (ref 36.0–46.0)
HEMOGLOBIN: 16.7 g/dL — AB (ref 12.0–15.0)
HGB: 15.8 g/dL (ref 11.6–15.9)
LYMPH#: 1 10*3/uL (ref 0.9–3.3)
LYMPH%: 13.5 % — ABNORMAL LOW (ref 14.0–49.7)
LYMPHS PCT: 19 % (ref 12–46)
Lymphs Abs: 1.7 10*3/uL (ref 0.7–4.0)
MCH: 33.9 pg (ref 25.1–34.0)
MCH: 34.7 pg — ABNORMAL HIGH (ref 26.0–34.0)
MCHC: 33.9 g/dL (ref 31.5–36.0)
MCHC: 35.2 g/dL (ref 30.0–36.0)
MCV: 100.2 fL (ref 79.5–101.0)
MCV: 98.8 fL (ref 78.0–100.0)
MONO#: 0.9 10*3/uL (ref 0.1–0.9)
MONO%: 12.1 % (ref 0.0–14.0)
Monocytes Absolute: 1 10*3/uL (ref 0.1–1.0)
Monocytes Relative: 11 % (ref 3–12)
NEUT#: 5.1 10*3/uL (ref 1.5–6.5)
NEUT%: 69.2 % (ref 38.4–76.8)
Neutro Abs: 6 10*3/uL (ref 1.7–7.7)
Neutrophils Relative %: 66 % (ref 43–77)
PLATELETS: 213 10*3/uL (ref 150–400)
Platelets: 186 10*3/uL (ref 145–400)
RBC: 4.64 10*6/uL (ref 3.70–5.45)
RBC: 4.81 MIL/uL (ref 3.87–5.11)
RDW: 13.6 % (ref 11.2–14.5)
RDW: 13.4 % (ref 11.5–15.5)
WBC: 7.4 10*3/uL (ref 3.9–10.3)
WBC: 9.1 10*3/uL (ref 4.0–10.5)

## 2015-05-22 LAB — COMPREHENSIVE METABOLIC PANEL (CC13)
ALT: 19 U/L (ref 0–55)
ANION GAP: 8 meq/L (ref 3–11)
AST: 24 U/L (ref 5–34)
Albumin: 4.2 g/dL (ref 3.5–5.0)
Alkaline Phosphatase: 92 U/L (ref 40–150)
BUN: 11.4 mg/dL (ref 7.0–26.0)
CO2: 25 meq/L (ref 22–29)
Calcium: 9.2 mg/dL (ref 8.4–10.4)
Chloride: 103 mEq/L (ref 98–109)
Creatinine: 0.8 mg/dL (ref 0.6–1.1)
EGFR: 72 mL/min/{1.73_m2} — AB (ref >90)
GLUCOSE: 124 mg/dL (ref 70–140)
POTASSIUM: 3.8 meq/L (ref 3.5–5.1)
SODIUM: 137 meq/L (ref 136–145)
Total Bilirubin: 1.26 mg/dL — ABNORMAL HIGH (ref 0.20–1.20)
Total Protein: 6.5 g/dL (ref 6.4–8.3)

## 2015-05-22 LAB — COMPREHENSIVE METABOLIC PANEL
ALBUMIN: 4.8 g/dL (ref 3.5–5.0)
ALT: 18 U/L (ref 14–54)
AST: 25 U/L (ref 15–41)
Alkaline Phosphatase: 95 U/L (ref 38–126)
Anion gap: 11 (ref 5–15)
BUN: 12 mg/dL (ref 6–20)
CALCIUM: 9.4 mg/dL (ref 8.9–10.3)
CHLORIDE: 102 mmol/L (ref 101–111)
CO2: 22 mmol/L (ref 22–32)
CREATININE: 0.69 mg/dL (ref 0.44–1.00)
GFR calc Af Amer: >60 mL/min (ref >60)
GFR calc non Af Amer: >60 mL/min (ref >60)
Glucose, Bld: 108 mg/dL — ABNORMAL HIGH (ref 65–99)
POTASSIUM: 3.5 mmol/L (ref 3.5–5.1)
SODIUM: 135 mmol/L (ref 135–145)
TOTAL PROTEIN: 7.4 g/dL (ref 6.5–8.1)
Total Bilirubin: 1.4 mg/dL — ABNORMAL HIGH (ref 0.3–1.2)

## 2015-05-22 LAB — LACTATE DEHYDROGENASE (CC13): LDH: 214 U/L (ref 125–245)

## 2015-05-22 LAB — PHOSPHORUS: PHOSPHORUS: 3.5 mg/dL (ref 2.5–4.6)

## 2015-05-22 LAB — MAGNESIUM: MAGNESIUM: 1.8 mg/dL (ref 1.7–2.4)

## 2015-05-22 LAB — TSH: TSH: 0.886 u[IU]/mL (ref 0.350–4.500)

## 2015-05-22 MED ORDER — APIXABAN 2.5 MG PO TABS
2.5000 mg | ORAL_TABLET | Freq: Two times a day (BID) | ORAL | Status: DC
  Administered 2015-05-22: 2.5 mg via ORAL
  Filled 2015-05-22 (×2): qty 1

## 2015-05-22 MED ORDER — DILTIAZEM LOAD VIA INFUSION
15.0000 mg | Freq: Once | INTRAVENOUS | Status: AC
  Administered 2015-05-22: 15 mg via INTRAVENOUS
  Filled 2015-05-22: qty 15

## 2015-05-22 MED ORDER — APIXABAN 2.5 MG PO TABS: 2.5000 mg | ORAL_TABLET | Freq: Two times a day (BID) | ORAL | Status: DC

## 2015-05-22 MED ORDER — DILTIAZEM HCL ER COATED BEADS 120 MG PO CP24
120.0000 mg | ORAL_CAPSULE | Freq: Once | ORAL | Status: AC
  Administered 2015-05-22: 120 mg via ORAL
  Filled 2015-05-22: qty 1

## 2015-05-22 MED ORDER — DEXTROSE 5 % IV SOLN
5.0000 mg/h | INTRAVENOUS | Status: DC
  Administered 2015-05-22: 5 mg/h via INTRAVENOUS
  Filled 2015-05-22: qty 100

## 2015-05-22 MED ORDER — DILTIAZEM HCL ER COATED BEADS 120 MG PO CP24: 120.0000 mg | ORAL_CAPSULE | Freq: Every day | ORAL | Status: DC

## 2015-05-22 NOTE — ED Notes (Signed)
MD at bedside. 

## 2015-05-22 NOTE — ED Provider Notes (Signed)
CSN: [id number]     Arrival date & time 05/22/15  1055 History   First MD Initiated Contact with Patient 05/22/15 1102     Chief Complaint  Patient presents with  . Atrial Fibrillation      HPI Patient presents to the emergency department with complaints of palpitations.  She has a history of intermittent feelings of her heart racing but has never been seen by cardiologist for this.  She has no history of atrial fibrillation.  She is in remission from lymphoma and was at the [facility] today for routine follow-up and was found to have a heart rate in the 130s and was found to be in A. fib with RVR.  The patient states that she felt slightly dizzy this morning.  No shortness of breath.  No chest pain.  She's never been worked up for this.  She presents with A. fib with RVR from oncology clinic.  She otherwise feels as though she's been in her normal state of health over the past several days.  She is currently in remission from her lymphoma.   Past Medical History  Diagnosis Date  . Follicular lymphoma   . Smoking   . Hypertension   . Anxiety and depression   . Ascending aortic aneurysm    Past Surgical History  Procedure Laterality Date  . Tonsillectomy    . Colonoscopy    . Tubal ligation    . Thyroid nodule excision     Family History  Problem Relation Age of Onset  . Cancer Mother 7    colon cancer  . Cancer Maternal Aunt     multiple aunts have colon cancer   History  Substance Use Topics  . Smoking status: Current Every Day Smoker -- 1.50 packs/day for 62 years  . Smokeless tobacco: Never Used  . Alcohol Use: 25.2 oz/week    42 Shots of liquor per week     Comment: 5-6 oz vodka yesterday afternoon   OB History    No data available     Review of Systems  All other systems reviewed and are negative.     Allergies  Codeine  Home Medications   Prior to Admission medications   Medication Sig Start Date End Date Taking? Authorizing Provider  aspirin 81  MG tablet Take 81 mg by mouth every other day.    Yes Historical Provider, MD  lisinopril (PRINIVIL,ZESTRIL) 20 MG tablet Take 20 mg by mouth 2 (two) times daily.    Yes Historical Provider, MD  Multiple Vitamin (MULTIVITAMIN) tablet Take 1 tablet by mouth every other day. Centrum Silver    Yes Historical Provider, MD  omega-3 acid ethyl esters (LOVAZA) 1 G capsule Take 1 g by mouth daily.   Yes Historical Provider, MD  sodium chloride (OCEAN) 0.65 % nasal spray Place 1 spray into the nose as needed. For nasal congestion.   Yes Historical Provider, MD  tiotropium (SPIRIVA) 18 MCG inhalation capsule Place 18 mcg into inhaler and inhale daily.     Yes Historical Provider, MD   BP 136/85 mmHg  Pulse 82  Temp(Src) 97.8 F (36.6 C) (Oral)  Resp 18  SpO2 94% Physical Exam  Constitutional: She is oriented to person, place, and time. She appears well-developed and well-nourished. No distress.  HENT:  Head: Normocephalic and atraumatic.  Eyes: EOM are normal.  Neck: Normal range of motion.  Cardiovascular: Regular rhythm and normal heart sounds.   Tachycardic.  Irregularly irregular  Pulmonary/Chest: Effort  normal and breath sounds normal.  Abdominal: Soft. She exhibits no distension. There is no tenderness.  Musculoskeletal: Normal range of motion.  Neurological: She is alert and oriented to person, place, and time.  Skin: Skin is warm and dry.  Psychiatric: She has a normal mood and affect. Judgment normal.  Nursing note and vitals reviewed.   ED Course  Procedures (including critical care time) Labs Review Labs Reviewed  CBC WITH DIFFERENTIAL/PLATELET - Abnormal; Notable for the following:    Hemoglobin 16.7 (*)    HCT 47.5 (*)    MCH 34.7 (*)    All other components within normal limits  COMPREHENSIVE METABOLIC PANEL - Abnormal; Notable for the following:    Glucose, Bld 108 (*)    Total Bilirubin 1.4 (*)    All other components within normal limits  MAGNESIUM  PHOSPHORUS   TSH    Imaging Review Dg Chest Portable 1 View  05/22/2015   CLINICAL DATA:  A history of non-Hodgkin's lymphoma, atrial fibrillation and weakness  EXAM: PORTABLE CHEST - 1 VIEW  COMPARISON:  05/15/2013  FINDINGS: Cardiac shadow is within normal limits. The lungs are well aerated bilaterally. Mild interstitial changes are seen. Increased density is noted over the apices bilaterally consistent with the first rib ends bilaterally. No acute bony abnormality is seen.  IMPRESSION: No active disease.   Electronically Signed   By: Inez Catalina M.D.   On: 05/22/2015 11:40     EKG Interpretation   Date/Time:  Friday May 22 2015 10:58:38 EDT Ventricular Rate:  175 PR Interval:    QRS Duration: 72 QT Interval:  276 QTC Calculation: 471 R Axis:   -2 Text Interpretation:  Atrial fibrillation with rapid V-rate RSR' in V1 or  V2, probably normal variant Repolarization abnormality, prob rate related  No significant change was found as compared to ecg earlier todayg.   Confirmed by Termaine Roupp  MD, Lennette Bihari ([id number]) on 05/22/2015 11:57:31 AM      ECG interpretation #2  Date: 05/22/2015  Rate: 84  Rhythm: normal sinus rhythm  QRS Axis: normal  Intervals: normal  ST/T Wave abnormalities: normal  Conduction Disutrbances: none  Narrative Interpretation: PAC  Old EKG Reviewed: No significant changes noted     MDM   Final diagnoses:  PAF (paroxysmal atrial fibrillation)  Atrial fibrillation with rapid ventricular response    Patient converted to normal sinus rhythm with Cardizem here in the emergency department.  She has maintained sinus rhythm off of IV Cardizem.  She was transitioned to long-acting oral Cardizem.  Her Chad-vasc score is 4 and therefore she is a candidate for anticoagulation.  She will be placed on twice a day [place]'s at 2.5 mg per dose given her age greater than 29 and her weight less than 60 kg.  This was verified with pharmacy.  I discussed the case shortly with cardiology  who agrees the patient is a candidate for outpatient management of her atrial fibrillation.  She is being arranged to follow-up in A fib clinic at this time.  Patient understands to return to the ER for new or worsening symptoms.    Jola Schmidt, MD 05/22/15 1335

## 2015-05-22 NOTE — Progress Notes (Signed)
[place] OFFICE PROGRESS NOTE  Patient Care Team: Mayra Neer, MD as PCP - General (Family Medicine) Clarene Essex, MD (Gastroenterology) Heath Lark, MD as Consulting Physician (Hematology and Oncology)  SUMMARY OF ONCOLOGIC HISTORY:  This is a pleasant lady who was discovered to have lymphoma after she felt a lump on her neck. Biopsy on June 6011 confirmed non-Hodgkin lymphoma, CD20 positive, of follicular lymphoma subtype. Bone marrow biopsy was negative. She received bendamustine with rituximab from July 2011 11/05/2010 and achieved complete remission. Subsequently, she was placed on maintenance rituximab from February 2012 to December 2013. Her last CT scan from June 2014 was negative for recurrence  INTERVAL HISTORY: Please see below for problem oriented charting. She denies new lymphadenopathy. She have frequent palpitations at home, resolved with coughing. She complained of mild weakness this morning.  REVIEW OF SYSTEMS:   Constitutional: Denies fevers, chills or abnormal weight loss Eyes: Denies blurriness of vision Ears, nose, mouth, throat, and face: Denies mucositis or sore throat Respiratory: Denies cough, dyspnea or wheezes Gastrointestinal:  Denies nausea, heartburn or change in bowel habits Skin: Denies abnormal skin rashes Lymphatics: Denies new lymphadenopathy or easy bruising Neurological:Denies numbness, tingling or new weaknesses Behavioral/Psych: Mood is stable, no new changes  All other systems were reviewed with the patient and are negative.  I have reviewed the past medical history, past surgical history, social history and family history with the patient and they are unchanged from previous note.  ALLERGIES:  is allergic to codeine.  MEDICATIONS:  Current Outpatient Prescriptions  Medication Sig Dispense Refill  . aspirin 81 MG tablet Take 81 mg by mouth every other day.     . lisinopril (PRINIVIL,ZESTRIL) 20 MG tablet Take 20 mg by  mouth 2 (two) times daily.     . Multiple Vitamin (MULTIVITAMIN) tablet Take 1 tablet by mouth every other day. Centrum Silver     . omega-3 acid ethyl esters (LOVAZA) 1 G capsule Take 1 g by mouth daily.    . sodium chloride (OCEAN) 0.65 % nasal spray Place 1 spray into the nose as needed. For nasal congestion.    Marland Kitchen tiotropium (SPIRIVA) 18 MCG inhalation capsule Place 18 mcg into inhaler and inhale daily.       No current facility-administered medications for this visit.    PHYSICAL EXAMINATION: ECOG PERFORMANCE STATUS: 1 - Symptomatic but completely ambulatory  Filed Vitals:   05/22/15 1014  BP: 131/88  Pulse: 136  Temp: 98 F (36.7 C)  Resp: 20   Filed Weights   05/22/15 1014  Weight: 110 lb 9.6 oz (50.168 kg)    GENERAL:alert, no distress and comfortable SKIN: skin color, texture, turgor are normal, no rashes or significant lesions EYES: normal, Conjunctiva are pink and non-injected, sclera clear OROPHARYNX:no exudate, no erythema and lips, buccal mucosa, and tongue normal  NECK: supple, thyroid normal size, non-tender, without nodularity LYMPH:  no palpable lymphadenopathy in the cervical, axillary or inguinal LUNGS: clear to auscultation and percussion with normal breathing effort HEART: Irregular rate and breathing without murmurs or lower extremity edema.  ABDOMEN:abdomen soft, non-tender and normal bowel sounds Musculoskeletal:no cyanosis of digits and no clubbing  NEURO: alert & oriented x 3 with fluent speech, no focal motor/sensory deficits  LABORATORY DATA:  I have reviewed the data as listed    Component Value Date/Time   NA 137 05/22/2015 0951   NA 134* 07/16/2012 0921   NA 133 11/17/2011 0753   K 3.8 05/22/2015 0951  K 4.1 07/16/2012 0921   K 4.2 11/17/2011 0753   CL 97* 05/15/2013 0926   CL 100 07/16/2012 0921   CL 96* 11/17/2011 0753   CO2 25 05/22/2015 0951   CO2 27 07/16/2012 0921   CO2 28 11/17/2011 0753   GLUCOSE 124 05/22/2015 0951    GLUCOSE 94 05/15/2013 0926   GLUCOSE 81 07/16/2012 0921   GLUCOSE 98 11/17/2011 0753   BUN 11.4 05/22/2015 0951   BUN 9 07/16/2012 0921   BUN 13 11/17/2011 0753   CREATININE 0.8 05/22/2015 0951   CREATININE 0.61 07/16/2012 0921   CREATININE 0.7 11/17/2011 0753   CALCIUM 9.2 05/22/2015 0951   CALCIUM 9.6 07/16/2012 0921   CALCIUM 9.6 11/17/2011 0753   PROT 6.5 05/22/2015 0951   PROT 6.1 07/16/2012 0921   PROT 7.4 11/17/2011 0753   ALBUMIN 4.2 05/22/2015 0951   ALBUMIN 4.4 07/16/2012 0921   AST 24 05/22/2015 0951   AST 24 07/16/2012 0921   AST 31 11/17/2011 0753   ALT 19 05/22/2015 0951   ALT 18 07/16/2012 0921   ALT 24 11/17/2011 0753   ALKPHOS 92 05/22/2015 0951   ALKPHOS 74 07/16/2012 0921   ALKPHOS 93* 11/17/2011 0753   BILITOT 1.26* 05/22/2015 0951   BILITOT 0.9 07/16/2012 0921   BILITOT 1.00 11/17/2011 0753   GFRNONAA 82* 01/06/2012 0857   GFRAA >90 01/06/2012 0857    No results found for: SPEP, UPEP  Lab Results  Component Value Date   WBC 7.4 05/22/2015   NEUTROABS 5.1 05/22/2015   HGB 15.8 05/22/2015   HCT 46.6 05/22/2015   MCV 100.2 05/22/2015   PLT 186 05/22/2015      Chemistry      Component Value Date/Time   NA 137 05/22/2015 0951   NA 134* 07/16/2012 0921   NA 133 11/17/2011 0753   K 3.8 05/22/2015 0951   K 4.1 07/16/2012 0921   K 4.2 11/17/2011 0753   CL 97* 05/15/2013 0926   CL 100 07/16/2012 0921   CL 96* 11/17/2011 0753   CO2 25 05/22/2015 0951   CO2 27 07/16/2012 0921   CO2 28 11/17/2011 0753   BUN 11.4 05/22/2015 0951   BUN 9 07/16/2012 0921   BUN 13 11/17/2011 0753   CREATININE 0.8 05/22/2015 0951   CREATININE 0.61 07/16/2012 0921   CREATININE 0.7 11/17/2011 0753      Component Value Date/Time   CALCIUM 9.2 05/22/2015 0951   CALCIUM 9.6 07/16/2012 0921   CALCIUM 9.6 11/17/2011 0753   ALKPHOS 92 05/22/2015 0951   ALKPHOS 74 07/16/2012 0921   ALKPHOS 93* 11/17/2011 0753   AST 24 05/22/2015 0951   AST 24 07/16/2012 0921   AST  31 11/17/2011 0753   ALT 19 05/22/2015 0951   ALT 18 07/16/2012 0921   ALT 24 11/17/2011 0753   BILITOT 1.26* 05/22/2015 0951   BILITOT 0.9 07/16/2012 0921   BILITOT 1.00 11/17/2011 0753    Stat EKG in my office confirm atrial fibrillation with rapid ventricular response ASSESSMENT & PLAN:  Follicular lymphoma Clinically, she has no evidence of recurrence. I recommend history, physical examination and blood work in 1 year.   Smoking I spent some time counseling the patient the importance of tobacco cessation. she is currently attempting to quit on her own  Atrial fibrillation with rapid ventricular response This constitutes a medical emergency. She feels a little weak. EKG in my office confirmed the diagnosis. I spoke with the cardiologist physician on  call who recommended the patient to be directed to the [facility] emergency department. With the current arrangement, I can only take her to [facility] even though the better facility with management of this specific diagnosis would be [facility]. In any case, I felt it is necessary to stabilize her condition first at [facility] before she can be transferred to [facility]. The patient is fairly reluctant to be admitted. After a lot of persuasion, she ultimately agrees to be directed to [facility] emergency department for further management.    Overall total contact time is 60 minutes.  All questions were answered. The patient knows to call the clinic with any problems, questions or concerns. No barriers to learning was detected. I spent 40 minutes counseling the patient face to face. The total time spent in the appointment was 60 minutes and more than 50% was on counseling and review of test results     [facility], Tarrance Januszewski, MD 05/22/2015 11:00 AM

## 2015-05-22 NOTE — Telephone Encounter (Signed)
lvm for pt regarding to JUNE 2017 appt.....mailed pt appt sched/avs and letter

## 2015-05-22 NOTE — ED Notes (Signed)
Bed: RESB Expected date:  Expected time:  Means of arrival:  Comments: Cancer center 

## 2015-05-22 NOTE — Assessment & Plan Note (Signed)
This constitutes a medical emergency. She feels a little weak. EKG in my office confirmed the diagnosis. I spoke with the cardiologist physician on call who recommended the patient to be directed to the [facility] emergency department. With the current arrangement, I can only take her to [facility] even though the better facility with management of this specific diagnosis would be [facility]. In any case, I felt it is necessary to stabilize her condition first at [facility] before she can be transferred to [facility]. The patient is fairly reluctant to be admitted. After a lot of persuasion, she ultimately agrees to be directed to [facility] emergency department for further management.

## 2015-05-22 NOTE — Assessment & Plan Note (Signed)
I spent some time counseling the patient the importance of tobacco cessation. she is currently attempting to quit on her own 

## 2015-05-22 NOTE — ED Notes (Signed)
Pt sent from cancer center for a fib. HR 169

## 2015-05-22 NOTE — Assessment & Plan Note (Addendum)
Clinically, she has no evidence of recurrence. I recommend history, physical examination and blood work in 1 year.

## 2015-05-22 NOTE — Discharge Instructions (Signed)
Atrial Fibrillation °Atrial fibrillation is a type of irregular heart rhythm (arrhythmia). During atrial fibrillation, the upper chambers of the heart (atria) quiver continuously in a chaotic pattern. This causes an irregular and often rapid heart rate.  °Atrial fibrillation is the result of the heart becoming overloaded with disorganized signals that tell it to beat. These signals are normally released one at a time by a part of the right atrium called the sinoatrial node. They then travel from the atria to the lower chambers of the heart (ventricles), causing the atria and ventricles to contract and pump blood as they pass. In atrial fibrillation, parts of the atria outside of the sinoatrial node also release these signals. This results in two problems. First, the atria receive so many signals that they do not have time to fully contract. Second, the ventricles, which can only receive one signal at a time, beat irregularly and out of rhythm with the atria.  °There are three types of atrial fibrillation:  °· Paroxysmal. Paroxysmal atrial fibrillation starts suddenly and stops on its own within a week. °· Persistent. Persistent atrial fibrillation lasts for more than a week. It may stop on its own or with treatment. °· Permanent. Permanent atrial fibrillation does not go away. Episodes of atrial fibrillation may lead to permanent atrial fibrillation. °Atrial fibrillation can prevent your heart from pumping blood normally. It increases your risk of stroke and can lead to heart failure.  °CAUSES  °· Heart conditions, including a heart attack, heart failure, coronary artery disease, and heart valve conditions.   °· Inflammation of the sac that surrounds the heart (pericarditis). °· Blockage of an artery in the lungs (pulmonary embolism). °· Pneumonia or other infections. °· Chronic lung disease. °· Thyroid problems, especially if the thyroid is overactive (hyperthyroidism). °· Caffeine, excessive alcohol use, and use  of some illegal drugs.   °· Use of some medicines, including certain decongestants and diet pills. °· Heart surgery.   °· Birth defects.   °Sometimes, no cause can be found. When this happens, the atrial fibrillation is called lone atrial fibrillation. The risk of complications from atrial fibrillation increases if you have lone atrial fibrillation and you are age 60 years or older. °RISK FACTORS °· Heart failure. °· Coronary artery disease. °· Diabetes mellitus.   °· High blood pressure (hypertension).   °· Obesity.   °· Other arrhythmias.   °· Increased age. °SIGNS AND SYMPTOMS  °· A feeling that your heart is beating rapidly or irregularly.   °· A feeling of discomfort or pain in your chest.   °· Shortness of breath.   °· Sudden light-headedness or weakness.   °· Getting tired easily when exercising.   °· Urinating more often than normal (mainly when atrial fibrillation first begins).   °In paroxysmal atrial fibrillation, symptoms may start and suddenly stop. °DIAGNOSIS  °Your health care provider may be able to detect atrial fibrillation when taking your pulse. Your health care provider may have you take a test called an ambulatory electrocardiogram (ECG). An ECG records your heartbeat patterns over a 24-hour period. You may also have other tests, such as: °· Transthoracic echocardiogram (TTE). During echocardiography, sound waves are used to evaluate how blood flows through your heart. °· Transesophageal echocardiogram (TEE). °· Stress test. There is more than one type of stress test. If a stress test is needed, ask your health care provider about which type is best for you. °· Chest X-ray exam. °· Blood tests. °· Computed tomography (CT). °TREATMENT  °Treatment may include: °· Treating any underlying conditions. For example, if you   have an overactive thyroid, treating the condition may correct atrial fibrillation.  Taking medicine. Medicines may be given to control a rapid heart rate or to prevent blood  clots, heart failure, or a stroke.  Having a procedure to correct the rhythm of the heart:  Electrical cardioversion. During electrical cardioversion, a controlled, low-energy shock is delivered to the heart through your skin. If you have chest pain, very low blood pressure, or sudden heart failure, this procedure may need to be done as an emergency.  Catheter ablation. During this procedure, heart tissues that send the signals that cause atrial fibrillation are destroyed.  Surgical ablation. During this surgery, thin lines of heart tissue that carry the abnormal signals are destroyed. This procedure can either be an open-heart surgery or a minimally invasive surgery. With the minimally invasive surgery, small cuts are made to access the heart instead of a large opening.  Pulmonary venous isolation. During this surgery, tissue around the veins that carry blood from the lungs (pulmonary veins) is destroyed. This tissue is thought to carry the abnormal signals. HOME CARE INSTRUCTIONS   Take medicines only as directed by your health care provider. Some medicines can make atrial fibrillation worse or recur.  If blood thinners were prescribed by your health care provider, take them exactly as directed. Too much blood-thinning medicine can cause bleeding. If you take too little, you will not have the needed protection against stroke and other problems.  Perform blood tests at home if directed by your health care provider. Perform blood tests exactly as directed.  Quit smoking if you smoke.  Do not drink alcohol.  Do not drink caffeinated beverages such as coffee, soda, and some teas. You may drink decaffeinated coffee, soda, or tea.   Maintain a healthy weight.Do not use diet pills unless your health care provider approves. They may make heart problems worse.   Follow diet instructions as directed by your health care provider.  Exercise regularly as directed by your health care  provider.  Keep all follow-up visits as directed by your health care provider. This is important. PREVENTION  The following substances can cause atrial fibrillation to recur:   Caffeinated beverages.  Alcohol.  Certain medicines, especially those used for breathing problems.  Certain herbs and herbal medicines, such as those containing ephedra or ginseng.  Illegal drugs, such as cocaine and amphetamines. Sometimes medicines are given to prevent atrial fibrillation from recurring. Proper treatment of any underlying condition is also important in helping prevent recurrence.  SEEK MEDICAL CARE IF:  You notice a change in the rate, rhythm, or strength of your heartbeat.  You suddenly begin urinating more frequently.  You tire more easily when exerting yourself or exercising. SEEK IMMEDIATE MEDICAL CARE IF:   You have chest pain, abdominal pain, sweating, or weakness.  You feel nauseous.  You have shortness of breath.  You suddenly have swollen feet and ankles.  You feel dizzy.  Your face or limbs feel numb or weak.  You have a change in your vision or speech. MAKE SURE YOU:   Understand these instructions.  Will watch your condition.  Will get help right away if you are not doing well or get worse. Document Released: 11/21/2005 Document Revised: 04/07/2014 Document Reviewed: 01/01/2013 [facility] Patient Information 2015 [place], [place]. This information is not intended to replace advice given to you by your health care provider. Make sure you discuss any questions you have with your health care provider.  Anticoagulation, Generic Anticoagulants are medicines  used to prevent clots from developing in your veins. These medicine are also known as blood thinners. If blood clots are untreated, they could travel to your lungs. This is called a pulmonary embolus. A blood clot in your lungs can be fatal.  Health care providers often use anticoagulants to prevent clots following  surgery. Anticoagulants are also used along with aspirin when the heart is not getting enough blood. Another anticoagulant called warfarin is started 2 to 3 days after a rapid-acting injectable anticoagulant is started. The rapid-acting anticoagulants are usually continued until warfarin has begun to work. Your health care provider will judge this length of time by blood tests known as the prothrombin time (PT) and International Normalization Ratio (INR). This means that your blood is at the necessary and best level to prevent clots. RISKS AND COMPLICATIONS  If you have received recent epidural anesthesia, spinal anesthesia, or a spinal tap while receiving anticoagulants, you are at risk for developing a blood clot in or around the spine. This condition could result in long-term or permanent paralysis.  Because anticoagulants thin your blood, severe bleeding may occur from any tissue or organ. Symptoms of the blood being too thin may include:  Bleeding from the nose or gums that does not stop quickly.  Blood in bowel movements which may appear as bright red, dark, or black tarry stools.  Blood in the urine which may appear as pink, red, or brown urine.  Unusual bruising or bruising easily.  A cut that does not stop bleeding within 10 minutes.  Vomiting blood or continuous nausea for more than 1 day.  Coughing up blood.  Broken blood vessels in your eye (subconjunctival hemorrhage).  Abdominal or back pain with or without flank bruising.  Sudden, severe headache.  Sudden weakness or numbness of the face, arm, or leg, especially on one side of the body.  Sudden confusion.  Trouble speaking (aphasia) or understanding.  Sudden trouble seeing in one or both eyes.  Sudden trouble walking.  Dizziness.  Loss of balance or coordination.  Vaginal bleeding.  Swelling or pain at an injection site.  Superficial fat tissue death (necrosis) which may cause skin scarring. This is more  common in women and may first present as pain in the waist, thighs, or buttocks.  Fever.  Too little anticoagulation continues to allow the risk for blood clots. HOME CARE INSTRUCTIONS   Due to the complications of anticoagulants, it is very important that you take your anticoagulant as directed by your health care provider. Anticoagulants need to be taken exactly as instructed. Be sure you understand all your anticoagulant instructions.  Keep all follow-up appointments with your health care provider as directed. It is very important to keep your appointments. Not keeping appointments could result in a chronic or permanent injury, pain, or disability.  Warfarin. Your health care provider will advise you on the length of treatment (usually 3-6 months, sometimes lifelong).  Take warfarin exactly as directed by your health care provider. It is recommended that you take your warfarin dose at the same time of the day. It is preferred that you take warfarin in the late afternoon. If you have been told to stop taking warfarin, do not resume taking warfarin until directed to do so by your health care provider. Follow your health care provider's instructions if you accidentally take an extra dose or miss a dose of warfarin. It is very important to take warfarin as directed since bleeding or blood clots could result in  chronic or permanent injury, pain, or disability.  Too much and too little warfarin are both dangerous. Too much warfarin increases the risk of bleeding. Too little warfarin continues to allow the risk for blood clots. While taking warfarin, you will need to have regular blood tests to measure your blood clotting time. These blood tests usually include both the prothrombin time (PT) and International Normalized Ratio (INR) tests. The PT and INR results allow your health care provider to adjust your dose of warfarin. The dose can change for many reasons. It is critically important that you have  your PT and INR levels drawn exactly as directed. Your warfarin dose may stay the same or change depending on what the PT and INR results are. Be sure to follow up with your health care provider regarding your PT and INR test results and what your warfarin dosage should be.  Many medicines can interfere with warfarin and affect the PT and INR results. You must tell your health care provider about any and all medicines you take, this includes all vitamins and supplements. Ask your health care provider before taking these. Prescription and over-the-counter medicine consistency is critical to warfarin management. It is important that potential interactions are checked before you start a new medicine. Be especially cautious with aspirin and anti-inflammatory medicines. Ask your health care provider before taking these. Medicines such as antibiotics and acid-reducing medicine can interact with warfarin and can cause an increased warfarin effect. Warfarin can also interfere with the effectiveness of medicines you are taking. Do not take or discontinue any prescribed or over-the-counter medicine except on the advice of your health care provider or pharmacist.  Some vitamins, supplements, and herbal products interfere with the effectiveness of warfarin. Vitamin E may increase the anticoagulant effects of warfarin. Vitamin K may can cause warfarin to be less effective. Do not take or discontinue any vitamin, supplement, or herbal product except on the advice of your health care provider or pharmacist.  Eat what you normally eat and keep the vitamin K content of your diet consistent. Avoid major changes in your diet, or notify your health care provider before changing your diet. Suddenly getting a lot more vitamin K could cause your blood to clot too quickly. A sudden decrease in vitamin K intake could cause your blood to clot too slowly. These changes in vitamin K intake could lead to dangerous blood clotsor to  bleeding. To keep your vitamin K intake consistent, you must be aware of which foods contain moderate or high amounts of vitamin K. Some foods high in vitamin K include spinach, kale, broccoli, cabbage, greens, Brussels sprouts, asparagus, Bok [place]oy, coleslaw, parsley, and green tea. Arrange a visit with a dietitian to answer your questions.  If you have a loss of appetite or get the stomach flu (viral gastroenteritis), talk to your health care provider as soon as possible. A decrease in your normal vitamin K intake can make you more sensitive to your usual dose of warfarin.  Some medical conditions may increase your risk for bleeding while you are taking warfarin. A fever, diarrhea lasting more than a day, worsening heart failure, or worsening liver function are some medical conditions that could affect warfarin. Contact your health care provider if you have any of these medical conditions.  Alcohol can change the body's ability to handle warfarin. It is best to avoid alcoholic drinks or consume only very small amounts while taking warfarin. Notify your health care provider if you change your alcohol  intake. A sudden increase in alcohol use can increase your risk of bleeding. Chronic alcohol use can cause warfarin to be less effective.  Be careful not to cut yourself when using sharp objects or while shaving.  Inform all your health care providers and your dentist that you take an anticoagulant.  Limit physical activities or sports that could result in a fall or cause injury. Avoid contact sports.  Wear medical alert jewelry or carry a medical alert card. SEEK IMMEDIATE MEDICAL CARE IF:  You cough up blood.  You have dark or black stools or there is bright red blood coming from your rectum.  You vomit blood or have nausea for more than 1 day.  You have blood in the urine or pink colored urine.  You have unusual bruising or have increased bruising.  You have bleeding from the nose or gums  that does not stop quickly.  You have a cut that does not stop bleeding within a 2-3 minutes.  You have sudden weakness or numbness of the face, arm, or leg, especially on one side of the body.  You have sudden confusion.  You have trouble speaking (aphasia) or understanding.  You have sudden trouble seeing in one or both eyes.  You have sudden trouble walking.  You have dizziness.  You have a loss of balance or coordination.  You have a sudden, severe headache.  You have a serious fall or head injury, even if you are not bleeding.  You have swelling or pain at an injection site.  You have unexplained tenderness or pain in the abdomen, back, waist, thighs or buttocks.  You have a fever. Any of these symptoms may represent a serious problem that is an emergency. Do not wait to see if the symptoms will go away. Get medical help right away. Call your local emergency services (911 in [place] Do not drive yourself to the hospital. Document Released: 11/21/2005 Document Revised: 11/26/2013 Document Reviewed: 06/25/2008 [facility] Patient Information 2015 [place], [place]. This information is not intended to replace advice given to you by your health care provider. Make sure you discuss any questions you have with your health care provider.

## 2015-05-28 ENCOUNTER — Encounter (HOSPITAL_COMMUNITY): Payer: Self-pay | Admitting: Nurse Practitioner

## 2015-05-28 ENCOUNTER — Ambulatory Visit (HOSPITAL_COMMUNITY)
Admit: 2015-05-28 | Discharge: 2015-05-28 | Disposition: A | Discharge status: Home / Self Care | Payer: Medicare Other | Source: Ambulatory Visit | Attending: Nurse Practitioner | Admitting: Nurse Practitioner

## 2015-05-28 VITALS — BP 150/80 | HR 78 | Ht 65.0 in | Wt 110.6 lb

## 2015-05-28 DIAGNOSIS — I712 Thoracic aortic aneurysm, without rupture: Secondary | ICD-10-CM | POA: Diagnosis not present

## 2015-05-28 DIAGNOSIS — I4891 Unspecified atrial fibrillation: Secondary | ICD-10-CM | POA: Diagnosis present

## 2015-05-28 DIAGNOSIS — F17210 Nicotine dependence, cigarettes, uncomplicated: Secondary | ICD-10-CM | POA: Diagnosis not present

## 2015-05-28 DIAGNOSIS — Z7982 Long term (current) use of aspirin: Secondary | ICD-10-CM | POA: Insufficient documentation

## 2015-05-28 DIAGNOSIS — I480 Paroxysmal atrial fibrillation: Secondary | ICD-10-CM | POA: Insufficient documentation

## 2015-05-28 DIAGNOSIS — Z8572 Personal history of non-Hodgkin lymphomas: Secondary | ICD-10-CM | POA: Insufficient documentation

## 2015-05-28 DIAGNOSIS — Z7902 Long term (current) use of antithrombotics/antiplatelets: Secondary | ICD-10-CM | POA: Diagnosis not present

## 2015-05-28 DIAGNOSIS — I10 Essential (primary) hypertension: Secondary | ICD-10-CM | POA: Insufficient documentation

## 2015-05-28 DIAGNOSIS — Z79899 Other long term (current) drug therapy: Secondary | ICD-10-CM | POA: Insufficient documentation

## 2015-05-28 NOTE — Progress Notes (Signed)
Patient ID: Lauren Maxwell, female   DOB: [birth year], 81 y.o.   MRN: [id number]    Primary Care Physician: Mayra Neer, MD Primary Cardiologist: Primary Electrophysiologist: Referring Physician: [facility]    Lauren Maxwell is a 81 y.o. female with a h/o paroxysmal atrial fibrillation who presents for consultation in the [facility].  The patient was initially diagnosed with atrial fibrillation 6/17 after presenting  at the Cancer center for a f/u visit and found to be in afib with RVR. She was taken to [facility] ER and treated with IV cardizem and converted to SR. Discharged form the ER on po cardizem and apixaban 2.5 mg bid due to weight less than 60 kg and age over 53.  She is in SR today and pt has not been aware of any irregular heart beat. No bleeding issues.  She does smoke and has expressed desire to reduce smoking habit. She drinks 3 oz of vodka nightly and limiting alcohol use also discussed. She denies snoring and does not exercise on a regular basis.  Today, she denies symptoms of palpitations, chest pain, shortness of breath, orthopnea, PND, lower extremity edema, dizziness, presyncope, syncope, snoring, daytime somnolence, bleeding, or neurologic sequela. The patient is tolerating medications without difficulties and is otherwise without complaint today.    Atrial Fibrillation Risk Factors:  she does not have symptoms or diagnosis of sleep apnea.  she does not have a history of rheumatic fever.  she does have a history of alcohol use.  she has a BMI of Body mass index is 18.4 kg/(m^2).Marland Kitchen Filed Weights   05/28/15 1109  Weight: 110 lb 9.6 oz (50.168 kg)    LA size: unknown   Atrial Fibrillation Management history:  Previous antiarrhythmic drugs: none  Previous cardioversions: none  Previous ablations: none  CHADS2VASC score: 4  Anticoagulation history: apixaban   Past Medical History  Diagnosis Date  . Follicular lymphoma   . Smoking    . Hypertension   . Anxiety and depression   . Ascending aortic aneurysm    Past Surgical History  Procedure Laterality Date  . Tonsillectomy    . Colonoscopy    . Tubal ligation    . Thyroid nodule excision      Current Outpatient Prescriptions  Medication Sig Dispense Refill  . apixaban (ELIQUIS) 2.5 MG TABS tablet Take 1 tablet (2.5 mg total) by mouth 2 (two) times daily. 60 tablet 0  . aspirin 81 MG tablet Take 81 mg by mouth every other day.     . diltiazem (CARDIZEM CD) 120 MG 24 hr capsule Take 1 capsule (120 mg total) by mouth daily. 30 capsule 0  . lisinopril (PRINIVIL,ZESTRIL) 20 MG tablet Take 20 mg by mouth 2 (two) times daily.     . Multiple Vitamin (MULTIVITAMIN) tablet Take 1 tablet by mouth every other day. Centrum Silver     . omega-3 acid ethyl esters (LOVAZA) 1 G capsule Take 1 g by mouth daily.    . sodium chloride (OCEAN) 0.65 % nasal spray Place 1 spray into the nose as needed. For nasal congestion.    . Tiotropium Bromide Monohydrate (SPIRIVA RESPIMAT) 2.5 MCG/ACT AERS Inhale 1 Inhaler into the lungs daily.     No current facility-administered medications for this encounter.    Allergies  Allergen Reactions  . Codeine Nausea And Vomiting    History   Social History  . Marital Status: Divorced    Spouse Name: N/A  . Number of  Children: N/A  . Years of Education: N/A   Occupational History  . Not on file.   Social History Main Topics  . Smoking status: Current Every Day Smoker -- 1.50 packs/day for 62 years  . Smokeless tobacco: Never Used  . Alcohol Use: 25.2 oz/week    42 Shots of liquor per week     Comment: 5-6 oz vodka yesterday afternoon  . Drug Use: No  . Sexual Activity: Not on file   Other Topics Concern  . Not on file   Social History Narrative    Family History  Problem Relation Age of Onset  . Cancer Mother 87    colon cancer  . Cancer Maternal Aunt     multiple aunts have colon cancer   The patient does not have a  history of early familial atrial fibrillation or other arrhythmias.  ROS- All systems are reviewed and negative except as per the HPI above.  Physical Exam: Filed Vitals:   05/28/15 1109  BP: 150/80  Pulse: 78  Height: 5\' 5"  (1.651 m)  Weight: 110 lb 9.6 oz (50.168 kg)    GEN- The patient is well appearing, alert and oriented x 3 today.   Head- normocephalic, atraumatic Eyes-  Sclera clear, conjunctiva pink Ears- hearing intact Oropharynx- clear Neck- supple, no JVP Lymph- no cervical lymphadenopathy Lungs- Clear to ausculation bilaterally, normal work of breathing Heart- Regular rate and rhythm, no murmurs, rubs or gallops, PMI not laterally displaced GI- soft, NT, ND, + BS Extremities- no clubbing, cyanosis, or edema MS- no significant deformity or atrophy Skin- no rash or lesion Psych- euthymic mood, full affect Neuro- strength and sensation are intact  EKG- SR with PAC's , septal infarct, age undetermined. Echo- none available  Epic records are reviewed at length today  Assessment and Plan:  1. Atrial fibrillation The patient has Asymptomatic paroxysmal  atrial fibrillation.  The patients CHAD2VASC score is 4.  she is  appropriately anticoagulated at this time. The patient is adequately rate controlled with cardizem. Antiarrhythmic therapy to dates has included none  The patients left atrial size is unknown, no previous echo A long discussion with the patient was had today regarding therapeutic strategies.  Extensive discussion of lifestyle modification including quit smoking, begin progressive daily aerobic exercise program, decrease or avoid alcohol intake, reduce exposure to stress and continue current medications was also discussed.  Presently, our recommendations include: 1. PAF Continue cardizem Continue apixaban- benefit vrs precautions with blood thinners discussed Stop ASA  2. Lifestyle modification Stop smoking Decrease alcohol intake Regular  exercise program  3. Obstructive sleep apnea Pt lives alone but denies snoring or symptoms of sleep apnea.  Will need a surface echo and set up for f/u with a general cardiololgist Pt is unsure of schedule and will call us back to schedule the above Afib clinic as needed.     Roderic Palau NP  Nurse Practitioner, [facility] 05/28/2015 1:15 PM

## 2015-06-02 ENCOUNTER — Ambulatory Visit (HOSPITAL_COMMUNITY)
Admission: RE | Admit: 2015-06-02 | Discharge: 2015-06-02 | Disposition: A | Discharge status: Home / Self Care | Payer: Medicare Other | Source: Ambulatory Visit | Attending: Nurse Practitioner | Admitting: Nurse Practitioner

## 2015-06-02 DIAGNOSIS — I4891 Unspecified atrial fibrillation: Secondary | ICD-10-CM

## 2015-06-02 DIAGNOSIS — I517 Cardiomegaly: Secondary | ICD-10-CM | POA: Diagnosis not present

## 2015-06-02 DIAGNOSIS — I5189 Other ill-defined heart diseases: Secondary | ICD-10-CM | POA: Insufficient documentation

## 2015-06-02 NOTE — Progress Notes (Signed)
  Echocardiogram 2D Echocardiogram has been performed.  Darlina Sicilian M 06/02/2015, 1:38 PM

## 2015-06-03 ENCOUNTER — Other Ambulatory Visit (HOSPITAL_COMMUNITY): Payer: Self-pay | Admitting: *Deleted

## 2015-06-03 MED ORDER — APIXABAN 2.5 MG PO TABS: 2.5000 mg | ORAL_TABLET | Freq: Two times a day (BID) | ORAL | Status: DC

## 2015-06-03 MED ORDER — DILTIAZEM HCL ER COATED BEADS 120 MG PO CP24: 120.0000 mg | ORAL_CAPSULE | Freq: Every day | ORAL | Status: DC

## 2015-08-07 ENCOUNTER — Telehealth: Payer: Self-pay | Admitting: *Deleted

## 2015-08-07 NOTE — Telephone Encounter (Signed)
Pt requested copy of labs from 05/22/15.  Placed copy of lab results in mail to pt's home address.  Left her VM informing her of labs being mailed.

## 2015-08-07 NOTE — Telephone Encounter (Signed)
PT. REQUESTING A COPY OF LAB RESULTS COMPLETED ON 05/22/15.

## 2015-08-14 ENCOUNTER — Telehealth (HOSPITAL_COMMUNITY): Payer: Self-pay | Admitting: *Deleted

## 2015-08-14 NOTE — Telephone Encounter (Signed)
PA for eliquis approved until 06/25/2016 [place] [id number]

## 2015-08-31 ENCOUNTER — Telehealth: Payer: Self-pay | Admitting: *Deleted

## 2015-08-31 DIAGNOSIS — I5032 Chronic diastolic (congestive) heart failure: Secondary | ICD-10-CM | POA: Insufficient documentation

## 2015-08-31 NOTE — Telephone Encounter (Signed)
Patient is requesting 05/22/15 office visit be sent to her Cardiologist, Dr. Daneen Schick by Wednesday (has appt with him on Wednesday). Message left with Tiffany in HIM

## 2015-08-31 NOTE — Progress Notes (Signed)
Cardiology Office Note   Date:  09/02/2015   ID:  Lauren Maxwell, DOB [birth year], MRN [id number]  PCP:  Mayra Neer, MD  Cardiologist:  Sinclair Grooms, MD   Chief Complaint  Patient presents with  . Atrial Fibrillation      History of Present Illness: Lauren Maxwell is a 81 y.o. female who presents for recent paroxysmal atrial fibrillation, anticoagulation therapy, CT diagnosed CAD (2014, asymptomatic), dilated ascending aorta, essential hypertension, and tobacco abuse.   The patient is asymptomatic and is referred for cardiac follow-up after recent episode of atrial fibrillation. She was totally asymptomatic during the atrial fib which was identified coincidentally by her oncologist Dr. Alvy Bimler. She has had no palpitations, neurological complaints, or side effects on Eliquis therapy.  She has had no bleeding, transient neurological symptoms, or chest pain. During the emergency room visit in June with atrial fibrillation, she had heart rates greater than 150 bpm and had no ischemic changes on EKG or chest pain. All EKGs were reviewed.    Past Medical History  Diagnosis Date  . Follicular lymphoma   . Smoking   . Hypertension   . Anxiety and depression   . Ascending aortic aneurysm     Past Surgical History  Procedure Laterality Date  . Tonsillectomy    . Colonoscopy    . Tubal ligation    . Thyroid nodule excision       Current Outpatient Prescriptions  Medication Sig Dispense Refill  . apixaban (ELIQUIS) 2.5 MG TABS tablet Take 1 tablet (2.5 mg total) by mouth 2 (two) times daily. 60 tablet 6  . diltiazem (CARDIZEM CD) 120 MG 24 hr capsule Take 1 capsule (120 mg total) by mouth daily. 30 capsule 6  . lisinopril (PRINIVIL,ZESTRIL) 20 MG tablet Take 20 mg by mouth 2 (two) times daily.     . Multiple Vitamin (MULTIVITAMIN) tablet Take 1 tablet by mouth every other day. Centrum Silver     . sodium chloride (OCEAN) 0.65 % nasal spray Place 1 spray into  the nose as needed. For nasal congestion.    . Tiotropium Bromide Monohydrate 2.5 MCG/ACT AERS Inhale into the lungs every other day.     No current facility-administered medications for this visit.    Allergies:   Codeine    Social History:  The patient  reports that she has been smoking.  She has never used smokeless tobacco. She reports that she drinks about 25.2 oz of alcohol per week. She reports that she does not use illicit drugs.   Family History:  The patient's family history includes Cancer in her maternal aunt; Cancer (age of onset: 28) in her mother; Lung cancer in her brother; Other in her brother.    ROS:  Please see the history of present illness.   Otherwise, review of systems are positive for disbelief that she has see clinical into T-cell dimension. Has a history of lymphoma and status post chemotherapy. She smokes cigarettes and has a chronic cough. She feels depressed and out of energy. She sleeps well..   All other systems are reviewed and negative.    PHYSICAL EXAM: VS:  BP 120/72 mmHg  Pulse 71  Ht 5\' 5"  (1.651 m)  Wt 49.805 kg (109 lb 12.8 oz)  BMI 18.27 kg/m2  SpO2 96% , BMI Body mass index is 18.27 kg/(m^2). GEN: Well nourished, well developed, in no acute distress HEENT: normal Neck: no JVD, carotid bruits, or masses Cardiac: RRR.  There is  no murmur, rub, or gallop. There is no edema. Respiratory :  clear to auscultation bilaterally, normal work of breathing. GI: soft, nontender, nondistended, + BS MS: no deformity or atrophy Skin: warm and dry, no rash Neuro:  Strength and sensation are intact Psych: euthymic mood, full affect   EKG:  EKG is not ordered today. The ekg reveals EKGs from June oh reviewed. See above.   Recent Labs: 05/22/2015: ALT 18; BUN 12; Creatinine, Ser 0.69; Hemoglobin 16.7*; Magnesium 1.8; Platelets 213; Potassium 3.5; Sodium 135; TSH 0.886    Lipid Panel    Component Value Date/Time   CHOL 156 08/10/2010 1332   TRIG 77  08/10/2010 1332   HDL 67 08/10/2010 1332   CHOLHDL 2.3 08/10/2010 1332   VLDL 15 08/10/2010 1332   LDLCALC 74 08/10/2010 1332      Wt Readings from Last 3 Encounters:  09/02/15 49.805 kg (109 lb 12.8 oz)  05/28/15 50.168 kg (110 lb 9.6 oz)  05/22/15 50.168 kg (110 lb 9.6 oz)      Other studies Reviewed: Additional studies/ records that were reviewed today include: ER visit with atrial fibrillation. Past records including previous CT scans.. The findings include CT scan in 2013 demonstrated three-vessel coronary calcification. Renal artery calcification was noted. There was evidence of ascending aortic dilatation. A recent echocardiogram did not reveal aortic enlargement. Echocardiogram demonstrated mild LVH with preserved systolic function and biatrial enlargement..    ASSESSMENT AND PLAN:  1. Atrial fibrillation with rapid ventricular response Clinically resolved. She doesn't know she has had recurrences. CHADS- VASC score is 3  2. Smoking Encouraged to discontinue especially given the diagnosis of coronary artery disease  3. Chronic diastolic heart failure Has diastolic dysfunction on echocardiography which was performed during assessment of structural integrity after she developed atrial fibrillation. There is no evidence of volume overload.  4. Coronary artery disease based upon CT finding of three-vessel calcification Asymptomatic and in the absence of cardiopulmonary complaints during atrial fibrillation with rapid ventricular response, including nonischemic appearing EKG, further workup is deferred at this time. I will insist upon aggressive lipid management and smoking cessation.  5. Aortic dilatation Identified by CT scan performed in 2013. Not seen on echocardiography done recently  6. Anticoagulation therapy not indicated No bleeding on low intensity Eliquis therapy  7. Tobacco use Continues but counseled to quit. The patient is not yet ready.    Current  medicines are reviewed at length with the patient today.  The patient has the following concerns regarding medicines: none.  The following changes/actions have been instituted:    Lipid panel needs to be done and anti-lipid therapy started if necessary for secondary prevention.  Discontinue aspirin while on Eliquis  We discussed the natural history of atrial fibrillation and the management strategy, including the need for long-term anticoagulation. No current indication for antiarrhythmics therapy.  We discussed coronary artery disease and the natural history of her asymptomatic status. We discussed risk factor modifications.  We talked about LVH and aortic root enlargement is being manifestations of previous poor blood pressure control.  Labs/ tests ordered today include:  No orders of the defined types were placed in this encounter.     Disposition:   FU with HS in 1 year  Signed, Sinclair Grooms, MD  09/02/2015 3:44 PM    [address], [place], [place]  [address] Phone: [phone]; Fax: [phone]

## 2015-09-02 ENCOUNTER — Encounter: Payer: Self-pay | Admitting: Interventional Cardiology

## 2015-09-02 ENCOUNTER — Ambulatory Visit (INDEPENDENT_AMBULATORY_CARE_PROVIDER_SITE_OTHER): Payer: Medicare Other | Admitting: Interventional Cardiology

## 2015-09-02 VITALS — BP 120/72 | HR 71 | Ht 65.0 in | Wt 109.8 lb

## 2015-09-02 DIAGNOSIS — F17200 Nicotine dependence, unspecified, uncomplicated: Secondary | ICD-10-CM

## 2015-09-02 DIAGNOSIS — I2510 Atherosclerotic heart disease of native coronary artery without angina pectoris: Secondary | ICD-10-CM

## 2015-09-02 DIAGNOSIS — Z720 Tobacco use: Secondary | ICD-10-CM | POA: Diagnosis not present

## 2015-09-02 DIAGNOSIS — I77819 Aortic ectasia, unspecified site: Secondary | ICD-10-CM

## 2015-09-02 DIAGNOSIS — I5032 Chronic diastolic (congestive) heart failure: Secondary | ICD-10-CM | POA: Diagnosis not present

## 2015-09-02 DIAGNOSIS — IMO0001 Reserved for inherently not codable concepts without codable children: Secondary | ICD-10-CM

## 2015-09-02 DIAGNOSIS — I4891 Unspecified atrial fibrillation: Secondary | ICD-10-CM | POA: Diagnosis not present

## 2015-09-02 DIAGNOSIS — Z789 Other specified health status: Secondary | ICD-10-CM

## 2015-09-02 NOTE — Patient Instructions (Signed)
Medication Instructions:  Your physician recommends that you continue on your current medications as directed. Please refer to the Current Medication list given to you today.   Labwork: Your physician recommends that you return for a FASTING lipid profile asap. Please call back to schedule your lab appointment   Testing/Procedures: None ordered  Follow-Up: Your physician wants you to follow-up in: 1 year with Dr.Smith You will receive a reminder letter in the mail two months in advance. If you don't receive a letter, please call our office to schedule the follow-up appointment.   Any Other Special Instructions Will Be Listed Below (If Applicable).

## 2015-09-16 ENCOUNTER — Other Ambulatory Visit (INDEPENDENT_AMBULATORY_CARE_PROVIDER_SITE_OTHER): Payer: Medicare Other

## 2015-09-16 DIAGNOSIS — I2510 Atherosclerotic heart disease of native coronary artery without angina pectoris: Secondary | ICD-10-CM | POA: Diagnosis not present

## 2015-09-16 LAB — LIPID PANEL
Cholesterol: 169 mg/dL (ref 125–200)
HDL: 89 mg/dL (ref >=46)
LDL Cholesterol: 71 mg/dL (ref <130)
Total CHOL/HDL Ratio: 1.9 Ratio (ref <=5.0)
Triglycerides: 43 mg/dL (ref <150)
VLDL: 9 mg/dL (ref <30)

## 2015-09-16 NOTE — Addendum Note (Signed)
Addended by: Domenica Reamer R on: 09/16/2015 10:07 AM   Modules accepted: Orders

## 2015-09-21 ENCOUNTER — Other Ambulatory Visit: Payer: Self-pay | Admitting: Family Medicine

## 2015-09-21 ENCOUNTER — Ambulatory Visit
Admission: RE | Admit: 2015-09-21 | Discharge: 2015-09-21 | Disposition: A | Discharge status: Home / Self Care | Payer: Medicare Other | Source: Ambulatory Visit | Attending: Family Medicine | Admitting: Family Medicine

## 2015-09-21 DIAGNOSIS — M5489 Other dorsalgia: Secondary | ICD-10-CM

## 2015-09-24 ENCOUNTER — Telehealth: Payer: Self-pay | Admitting: Interventional Cardiology

## 2015-09-24 NOTE — Telephone Encounter (Signed)
Returned pt call. Pt sts that she has been having loose bowel movements over the last 7-10 days. Pt denies black tarry or bloody stool. Adv pt feels that Eliquis is causing her diarrhea. Pt sts that what ever she eats goes right through her. Pt sts that she was seen by her pcp a couple of days ago and did not mention the diarrhea. Pt has been on Eliquis for over 4 months. Adv pt she should f/u with her pcp, and let her know about her ongoing diarrhea. Pt voiced appreciation for the call back and verbalized understanding

## 2015-09-24 NOTE — Telephone Encounter (Signed)
New message     Pt c/o medication issue:  1. Name of Medication: diltiazam and eliquis 2. How are you currently taking this medication (dosage and times per day)? Diltiazem 120mg  daily; eliquis 2.5mg  twice a day  3. Are you having a reaction (difficulty breathing--STAT)? no 4. What is your medication issue? Pt has had gastro intestinal symptoms (lots of gas and diarrhea) for 1 week.  Could it be coming from the medication?

## 2016-01-21 ENCOUNTER — Other Ambulatory Visit (HOSPITAL_COMMUNITY): Payer: Self-pay | Admitting: Nurse Practitioner

## 2016-01-29 ENCOUNTER — Other Ambulatory Visit (HOSPITAL_COMMUNITY): Payer: Self-pay | Admitting: Nurse Practitioner

## 2016-02-02 ENCOUNTER — Telehealth: Payer: Self-pay | Admitting: Interventional Cardiology

## 2016-02-02 NOTE — Telephone Encounter (Signed)
New message      Pt c/o medication issue:  1. Name of Medication: eliquis and diltiazem 2. How are you currently taking this medication (dosage and times per day)? 2.5mg  eliquis bid; 120mg   daily 3. Are you having a reaction (difficulty breathing--STAT)?  4. What is your medication issue? Pt has frequent nose bleeds

## 2016-02-02 NOTE — Telephone Encounter (Signed)
Returned pt call. Pt sts that she has had reoccurring nose bleeds daily, the last 2-3 days. Pt has no other complaints. She sts that it is always the right nostril.she applies slight pressure, and it resolves. Pt does suffer from allergies and dry nose, she uses nasal saline drops a couple of times a day. Adv pt to cut back on the saline drops. She could try keep her nostrils lubricated. Recommended she try a little vaseline on a qtip and rub gently on the inside of her nostrils. Adv pt that she should continue her medications as prescribed. If no relief she could f/u with her pcp, or an ENT for a possible cauterization. Adv pt to seek care for nose bleeds that will not stop. Pt voiced appreciation for the call and verbalized understanding.

## 2016-04-22 ENCOUNTER — Other Ambulatory Visit: Payer: Self-pay | Admitting: Family Medicine

## 2016-04-22 DIAGNOSIS — G458 Other transient cerebral ischemic attacks and related syndromes: Secondary | ICD-10-CM

## 2016-04-22 DIAGNOSIS — R42 Dizziness and giddiness: Secondary | ICD-10-CM

## 2016-04-22 DIAGNOSIS — M21371 Foot drop, right foot: Secondary | ICD-10-CM

## 2016-04-25 ENCOUNTER — Ambulatory Visit
Admission: RE | Admit: 2016-04-25 | Discharge: 2016-04-25 | Disposition: A | Discharge status: Home / Self Care | Payer: Medicare Other | Source: Ambulatory Visit | Attending: Family Medicine | Admitting: Family Medicine

## 2016-04-25 DIAGNOSIS — G458 Other transient cerebral ischemic attacks and related syndromes: Secondary | ICD-10-CM

## 2016-04-25 DIAGNOSIS — R42 Dizziness and giddiness: Secondary | ICD-10-CM

## 2016-04-25 MED ORDER — IOPAMIDOL (ISOVUE-300) INJECTION 61%
75.0000 mL | Freq: Once | INTRAVENOUS | Status: AC | PRN
Start: 2016-04-25 — End: 2016-04-25
  Administered 2016-04-25: 75 mL via INTRAVENOUS

## 2016-05-16 ENCOUNTER — Telehealth: Payer: Self-pay | Admitting: *Deleted

## 2016-05-16 NOTE — Telephone Encounter (Signed)
Pt reports had recent labs done at Dr. Raul Del office on May 19 th,  Which included CBC and CMET.  She will bring copy of results with her. She wants to know if she still needs labs done here on Friday before her MD appt?   Informed pt only one lab Dr. Alvy Bimler ordered is LDH, which was not done by Dr. Brigitte Pulse.  I will ask Dr. Alvy Bimler if she needs to have this done and call her back tomorrow.  Pt verbalized understanding.

## 2016-05-17 ENCOUNTER — Telehealth: Payer: Self-pay | Admitting: *Deleted

## 2016-05-17 NOTE — Telephone Encounter (Signed)
Informed pt Dr. Alvy Bimler said ok to cancel lab appt on Friday.  Just come a little before 10 am to see Dr. Alvy Bimler. No need for additional labs.  Bring copy of lab results from Dr. Brigitte Pulse.  Please call nurse back if any questions.

## 2016-05-17 NOTE — Telephone Encounter (Signed)
No need LDH You may cancel her lab appt

## 2016-05-20 ENCOUNTER — Other Ambulatory Visit: Payer: Medicare Other

## 2016-05-20 ENCOUNTER — Ambulatory Visit (HOSPITAL_BASED_OUTPATIENT_CLINIC_OR_DEPARTMENT_OTHER): Payer: Medicare Other | Admitting: Hematology and Oncology

## 2016-05-20 ENCOUNTER — Encounter: Payer: Self-pay | Admitting: Hematology and Oncology

## 2016-05-20 VITALS — BP 145/78 | HR 92 | Temp 98.0°F | Resp 18 | Ht 65.0 in | Wt 107.4 lb

## 2016-05-20 DIAGNOSIS — D7589 Other specified diseases of blood and blood-forming organs: Secondary | ICD-10-CM

## 2016-05-20 DIAGNOSIS — Z8572 Personal history of non-Hodgkin lymphomas: Secondary | ICD-10-CM

## 2016-05-20 DIAGNOSIS — Z720 Tobacco use: Secondary | ICD-10-CM

## 2016-05-20 DIAGNOSIS — F17200 Nicotine dependence, unspecified, uncomplicated: Secondary | ICD-10-CM

## 2016-05-20 NOTE — Assessment & Plan Note (Signed)
Clinically, she has no evidence of recurrence. The patient is a long-term cancer survivor. She has no signs of disease recurrence I will discharge her from the clinic and recommend PCP follow-up only.

## 2016-05-20 NOTE — Assessment & Plan Note (Signed)
I spent some time counseling the patient the importance of tobacco cessation. she is currently attempting to quit on her own 

## 2016-05-20 NOTE — Progress Notes (Signed)
Lauren Maxwell OFFICE PROGRESS NOTE  Patient Care Team: Mayra Neer, MD as PCP - General (Family Medicine) Clarene Essex, MD (Gastroenterology) Heath Lark, MD as Consulting Physician (Hematology and Oncology) Belva Crome, MD as Consulting Physician (Cardiology)  SUMMARY OF ONCOLOGIC HISTORY:  This is a pleasant lady who was discovered to have lymphoma after she felt a lump on her neck. Biopsy on June 6011 confirmed non-Hodgkin lymphoma, CD20 positive, of follicular lymphoma subtype. Bone marrow biopsy was negative. She received bendamustine with rituximab from July 2011 11/05/2010 and achieved complete remission. Subsequently, she was placed on maintenance rituximab from February 2012 to December 2013. Her last CT scan from June 2014 was negative for recurrence She developed atrial fibrillation in 2016 and was managed with medication and anticoagulation therapy  INTERVAL HISTORY: Please see below for problem oriented charting. She feels well. No new lymphadenopathy. Denies recent infection.  REVIEW OF SYSTEMS:   Constitutional: Denies fevers, chills or abnormal weight loss Eyes: Denies blurriness of vision Ears, nose, mouth, throat, and face: Denies mucositis or sore throat Respiratory: Denies cough, dyspnea or wheezes Cardiovascular: Denies palpitation, chest discomfort or lower extremity swelling Gastrointestinal:  Denies nausea, heartburn or change in bowel habits Skin: Denies abnormal skin rashes Lymphatics: Denies new lymphadenopathy or easy bruising Neurological:Denies numbness, tingling or new weaknesses Behavioral/Psych: Mood is stable, no new changes  All other systems were reviewed with the patient and are negative.  I have reviewed the past medical history, past surgical history, social history and family history with the patient and they are unchanged from previous note.  ALLERGIES:  is allergic to codeine.  MEDICATIONS:  Current Outpatient Prescriptions   Medication Sig Dispense Refill  . diltiazem (CARDIZEM CD) 120 MG 24 hr capsule take 1 capsule by mouth once daily 30 capsule 6  . ELIQUIS 2.5 MG TABS tablet take 1 tablet by mouth twice a day 60 tablet 6  . lisinopril (PRINIVIL,ZESTRIL) 20 MG tablet Take 20 mg by mouth 2 (two) times daily.     . Multiple Vitamin (MULTIVITAMIN) tablet Take 1 tablet by mouth every other day. Centrum Silver     . sodium chloride (OCEAN) 0.65 % nasal spray Place 1 spray into the nose as needed. For nasal congestion.    . Tiotropium Bromide Monohydrate 2.5 MCG/ACT AERS Inhale into the lungs every other day.     No current facility-administered medications for this visit.    PHYSICAL EXAMINATION: ECOG PERFORMANCE STATUS: 0 - Asymptomatic  Filed Vitals:   05/20/16 0938  BP: 145/78  Pulse: 92  Temp: 98 F (36.7 C)  Resp: 18   Filed Weights   05/20/16 0938  Weight: 107 lb 6.4 oz (48.716 kg)    GENERAL:alert, no distress and comfortable. She looks thin SKIN: skin color, texture, turgor are normal, no rashes or significant lesions EYES: normal, Conjunctiva are pink and non-injected, sclera clear OROPHARYNX:no exudate, no erythema and lips, buccal mucosa, and tongue normal  NECK: supple, thyroid normal size, non-tender, without nodularity LYMPH:  no palpable lymphadenopathy in the cervical, axillary or inguinal LUNGS: clear to auscultation and percussion with normal breathing effort HEART: regular rate & rhythm and no murmurs and no lower extremity edema ABDOMEN:abdomen soft, non-tender and normal bowel sounds Musculoskeletal:no cyanosis of digits and no clubbing  NEURO: alert & oriented x 3 with fluent speech, no focal motor/sensory deficits  LABORATORY DATA:  I have reviewed the data as listed    Component Value Date/Time   NA 135 05/22/2015  1111   NA 137 05/22/2015 0951   NA 133 11/17/2011 0753   K 3.5 05/22/2015 1111   K 3.8 05/22/2015 0951   K 4.2 11/17/2011 0753   CL 102 05/22/2015 1111    CL 97* 05/15/2013 0926   CL 96* 11/17/2011 0753   CO2 22 05/22/2015 1111   CO2 25 05/22/2015 0951   CO2 28 11/17/2011 0753   GLUCOSE 108* 05/22/2015 1111   GLUCOSE 124 05/22/2015 0951   GLUCOSE 94 05/15/2013 0926   GLUCOSE 98 11/17/2011 0753   BUN 12 05/22/2015 1111   BUN 11.4 05/22/2015 0951   BUN 13 11/17/2011 0753   CREATININE 0.69 05/22/2015 1111   CREATININE 0.8 05/22/2015 0951   CREATININE 0.7 11/17/2011 0753   CALCIUM 9.4 05/22/2015 1111   CALCIUM 9.2 05/22/2015 0951   CALCIUM 9.6 11/17/2011 0753   PROT 7.4 05/22/2015 1111   PROT 6.5 05/22/2015 0951   PROT 7.4 11/17/2011 0753   ALBUMIN 4.8 05/22/2015 1111   ALBUMIN 4.2 05/22/2015 0951   ALBUMIN 4.5 11/17/2011 0753   AST 25 05/22/2015 1111   AST 24 05/22/2015 0951   AST 31 11/17/2011 0753   ALT 18 05/22/2015 1111   ALT 19 05/22/2015 0951   ALT 24 11/17/2011 0753   ALKPHOS 95 05/22/2015 1111   ALKPHOS 92 05/22/2015 0951   ALKPHOS 93* 11/17/2011 0753   BILITOT 1.4* 05/22/2015 1111   BILITOT 1.26* 05/22/2015 0951   BILITOT 1.00 11/17/2011 0753   GFRNONAA >60 05/22/2015 1111   GFRAA >60 05/22/2015 1111    No results found for: SPEP, UPEP  Lab Results  Component Value Date   WBC 9.1 05/22/2015   NEUTROABS 6.0 05/22/2015   HGB 16.7* 05/22/2015   HCT 47.5* 05/22/2015   MCV 98.8 05/22/2015   PLT 213 05/22/2015      Chemistry      Component Value Date/Time   NA 135 05/22/2015 1111   NA 137 05/22/2015 0951   NA 133 11/17/2011 0753   K 3.5 05/22/2015 1111   K 3.8 05/22/2015 0951   K 4.2 11/17/2011 0753   CL 102 05/22/2015 1111   CL 97* 05/15/2013 0926   CL 96* 11/17/2011 0753   CO2 22 05/22/2015 1111   CO2 25 05/22/2015 0951   CO2 28 11/17/2011 0753   BUN 12 05/22/2015 1111   BUN 11.4 05/22/2015 0951   BUN 13 11/17/2011 0753   CREATININE 0.69 05/22/2015 1111   CREATININE 0.8 05/22/2015 0951   CREATININE 0.7 11/17/2011 0753      Component Value Date/Time   CALCIUM 9.4 05/22/2015 1111   CALCIUM  9.2 05/22/2015 0951   CALCIUM 9.6 11/17/2011 0753   ALKPHOS 95 05/22/2015 1111   ALKPHOS 92 05/22/2015 0951   ALKPHOS 93* 11/17/2011 0753   AST 25 05/22/2015 1111   AST 24 05/22/2015 0951   AST 31 11/17/2011 0753   ALT 18 05/22/2015 1111   ALT 19 05/22/2015 0951   ALT 24 11/17/2011 0753   BILITOT 1.4* 05/22/2015 1111   BILITOT 1.26* 05/22/2015 0951   BILITOT 1.00 11/17/2011 0753      ASSESSMENT & PLAN:  History of B-cell lymphoma Clinically, she has no evidence of recurrence. The patient is a long-term cancer survivor. She has no signs of disease recurrence I will discharge her from the clinic and recommend PCP follow-up only.  Smoking I spent some time counseling the patient the importance of tobacco cessation. she is currently attempting to quit on  her own  Increased MCV She has elevated MCV without anemia. This is related to alcohol intake. It is benign. The patient is attempting to stop drinking regularly.   No orders of the defined types were placed in this encounter.   All questions were answered. The patient knows to call the clinic with any problems, questions or concerns. No barriers to learning was detected. I spent 15 minutes counseling the patient face to face. The total time spent in the appointment was 20 minutes and more than 50% was on counseling and review of test results     [facility], Ysabella Babiarz, MD 05/20/2016 10:00 AM

## 2016-05-20 NOTE — Assessment & Plan Note (Signed)
She has elevated MCV without anemia. This is related to alcohol intake. It is benign. The patient is attempting to stop drinking regularly.

## 2016-06-06 ENCOUNTER — Telehealth: Payer: Self-pay | Admitting: Interventional Cardiology

## 2016-06-06 NOTE — Telephone Encounter (Signed)
New message      Pt c/o medication issue:  1. Name of Medication: diltiazem 120mg  and eliquis 2.5mg   2. How are you currently taking this medication (dosage and times per day)?  daily  3. Are you having a reaction (difficulty breathing--STAT)? no  4. What is your medication issue? Pt states she has been on both of these meds for 1 year.  For the last several weeks she has had rt leg tingling, weakness and numbness.  Could it be a side effect from the medication?

## 2016-06-06 NOTE — Telephone Encounter (Signed)
Returned pt's call. Pt sts that she was seen by her pcp in May for weakness in her left leg, a CT of the head was ordered to r/o drop foot. The Ct ws normal. Pt sts that she is still having weakness and numbness in her leg. Pt sts that she was told that she has a small fracture in her lower back and the weakness may be nerve related. Dr.Shaw recommended she have a MRI but she declined due to the cost of the test. Adv pt that her leg weakness is probably not related to her medication. Pt has no other complaints denies bleeding, palpitations.Adv pt to f/u with her pcp who began the work up for further recommendation. Pt agreeable and verbalized understanding.

## 2016-06-17 ENCOUNTER — Other Ambulatory Visit: Payer: Self-pay | Admitting: Family Medicine

## 2016-06-17 DIAGNOSIS — M545 Low back pain: Secondary | ICD-10-CM

## 2016-06-25 ENCOUNTER — Ambulatory Visit
Admission: RE | Admit: 2016-06-25 | Discharge: 2016-06-25 | Disposition: A | Discharge status: Home / Self Care | Payer: Medicare Other | Source: Ambulatory Visit | Attending: Family Medicine | Admitting: Family Medicine

## 2016-06-25 DIAGNOSIS — M545 Low back pain: Secondary | ICD-10-CM

## 2016-08-18 ENCOUNTER — Other Ambulatory Visit (HOSPITAL_COMMUNITY): Payer: Self-pay | Admitting: Nurse Practitioner

## 2016-08-19 ENCOUNTER — Encounter: Payer: Self-pay | Admitting: Interventional Cardiology

## 2016-09-01 ENCOUNTER — Ambulatory Visit: Payer: Medicare Other | Admitting: Interventional Cardiology

## 2016-09-11 ENCOUNTER — Encounter (HOSPITAL_COMMUNITY): Payer: Self-pay | Admitting: Emergency Medicine

## 2016-09-11 ENCOUNTER — Emergency Department (HOSPITAL_COMMUNITY): Payer: Medicare Other

## 2016-09-11 ENCOUNTER — Inpatient Hospital Stay (HOSPITAL_COMMUNITY)
Admission: EM | Admit: 2016-09-11 | Discharge: 2016-09-17 | DRG: 308 | Disposition: A | Discharge status: Skilled Nursing Facility | Payer: Medicare Other | Attending: Internal Medicine | Admitting: Internal Medicine

## 2016-09-11 DIAGNOSIS — F418 Other specified anxiety disorders: Secondary | ICD-10-CM | POA: Diagnosis present

## 2016-09-11 DIAGNOSIS — E871 Hypo-osmolality and hyponatremia: Secondary | ICD-10-CM | POA: Diagnosis present

## 2016-09-11 DIAGNOSIS — R001 Bradycardia, unspecified: Secondary | ICD-10-CM

## 2016-09-11 DIAGNOSIS — F1010 Alcohol abuse, uncomplicated: Secondary | ICD-10-CM | POA: Diagnosis present

## 2016-09-11 DIAGNOSIS — M48061 Spinal stenosis, lumbar region without neurogenic claudication: Secondary | ICD-10-CM | POA: Diagnosis present

## 2016-09-11 DIAGNOSIS — Y999 Unspecified external cause status: Secondary | ICD-10-CM

## 2016-09-11 DIAGNOSIS — I959 Hypotension, unspecified: Secondary | ICD-10-CM | POA: Diagnosis present

## 2016-09-11 DIAGNOSIS — R5381 Other malaise: Secondary | ICD-10-CM

## 2016-09-11 DIAGNOSIS — E869 Volume depletion, unspecified: Secondary | ICD-10-CM | POA: Diagnosis present

## 2016-09-11 DIAGNOSIS — F1090 Alcohol use, unspecified, uncomplicated: Secondary | ICD-10-CM

## 2016-09-11 DIAGNOSIS — F10188 Alcohol abuse with other alcohol-induced disorder: Secondary | ICD-10-CM | POA: Diagnosis present

## 2016-09-11 DIAGNOSIS — Z789 Other specified health status: Secondary | ICD-10-CM | POA: Diagnosis present

## 2016-09-11 DIAGNOSIS — I2510 Atherosclerotic heart disease of native coronary artery without angina pectoris: Secondary | ICD-10-CM | POA: Diagnosis present

## 2016-09-11 DIAGNOSIS — R262 Difficulty in walking, not elsewhere classified: Secondary | ICD-10-CM

## 2016-09-11 DIAGNOSIS — I495 Sick sinus syndrome: Principal | ICD-10-CM | POA: Diagnosis present

## 2016-09-11 DIAGNOSIS — Z818 Family history of other mental and behavioral disorders: Secondary | ICD-10-CM

## 2016-09-11 DIAGNOSIS — M4802 Spinal stenosis, cervical region: Secondary | ICD-10-CM | POA: Diagnosis present

## 2016-09-11 DIAGNOSIS — E876 Hypokalemia: Secondary | ICD-10-CM | POA: Diagnosis present

## 2016-09-11 DIAGNOSIS — R29898 Other symptoms and signs involving the musculoskeletal system: Secondary | ICD-10-CM

## 2016-09-11 DIAGNOSIS — I2584 Coronary atherosclerosis due to calcified coronary lesion: Secondary | ICD-10-CM | POA: Diagnosis present

## 2016-09-11 DIAGNOSIS — R42 Dizziness and giddiness: Secondary | ICD-10-CM

## 2016-09-11 DIAGNOSIS — Z9181 History of falling: Secondary | ICD-10-CM

## 2016-09-11 DIAGNOSIS — Z800 Family history of malignant neoplasm of digestive organs: Secondary | ICD-10-CM

## 2016-09-11 DIAGNOSIS — Z681 Body mass index (BMI) 19 or less, adult: Secondary | ICD-10-CM

## 2016-09-11 DIAGNOSIS — W1830XA Fall on same level, unspecified, initial encounter: Secondary | ICD-10-CM | POA: Diagnosis present

## 2016-09-11 DIAGNOSIS — I9589 Other hypotension: Secondary | ICD-10-CM | POA: Diagnosis present

## 2016-09-11 DIAGNOSIS — Z885 Allergy status to narcotic agent status: Secondary | ICD-10-CM

## 2016-09-11 DIAGNOSIS — R2681 Unsteadiness on feet: Secondary | ICD-10-CM

## 2016-09-11 DIAGNOSIS — G589 Mononeuropathy, unspecified: Secondary | ICD-10-CM | POA: Diagnosis present

## 2016-09-11 DIAGNOSIS — Z7902 Long term (current) use of antithrombotics/antiplatelets: Secondary | ICD-10-CM

## 2016-09-11 DIAGNOSIS — K709 Alcoholic liver disease, unspecified: Secondary | ICD-10-CM | POA: Diagnosis present

## 2016-09-11 DIAGNOSIS — S50312A Abrasion of left elbow, initial encounter: Secondary | ICD-10-CM | POA: Diagnosis present

## 2016-09-11 DIAGNOSIS — R531 Weakness: Secondary | ICD-10-CM

## 2016-09-11 DIAGNOSIS — F17200 Nicotine dependence, unspecified, uncomplicated: Secondary | ICD-10-CM | POA: Diagnosis present

## 2016-09-11 DIAGNOSIS — I712 Thoracic aortic aneurysm, without rupture: Secondary | ICD-10-CM | POA: Diagnosis present

## 2016-09-11 DIAGNOSIS — E43 Unspecified severe protein-calorie malnutrition: Secondary | ICD-10-CM | POA: Diagnosis present

## 2016-09-11 DIAGNOSIS — I110 Hypertensive heart disease with heart failure: Secondary | ICD-10-CM | POA: Diagnosis present

## 2016-09-11 DIAGNOSIS — I5032 Chronic diastolic (congestive) heart failure: Secondary | ICD-10-CM | POA: Diagnosis present

## 2016-09-11 DIAGNOSIS — Z79899 Other long term (current) drug therapy: Secondary | ICD-10-CM

## 2016-09-11 DIAGNOSIS — G721 Alcoholic myopathy: Secondary | ICD-10-CM | POA: Diagnosis present

## 2016-09-11 DIAGNOSIS — Y92010 Kitchen of single-family (private) house as the place of occurrence of the external cause: Secondary | ICD-10-CM

## 2016-09-11 DIAGNOSIS — R05 Cough: Secondary | ICD-10-CM

## 2016-09-11 DIAGNOSIS — M50221 Other cervical disc displacement at C4-C5 level: Secondary | ICD-10-CM | POA: Diagnosis present

## 2016-09-11 DIAGNOSIS — R059 Cough, unspecified: Secondary | ICD-10-CM

## 2016-09-11 DIAGNOSIS — Z8572 Personal history of non-Hodgkin lymphomas: Secondary | ICD-10-CM

## 2016-09-11 DIAGNOSIS — Z801 Family history of malignant neoplasm of trachea, bronchus and lung: Secondary | ICD-10-CM

## 2016-09-11 DIAGNOSIS — R296 Repeated falls: Secondary | ICD-10-CM

## 2016-09-11 DIAGNOSIS — R64 Cachexia: Secondary | ICD-10-CM | POA: Diagnosis present

## 2016-09-11 DIAGNOSIS — R627 Adult failure to thrive: Secondary | ICD-10-CM | POA: Diagnosis present

## 2016-09-11 DIAGNOSIS — Z7289 Other problems related to lifestyle: Secondary | ICD-10-CM

## 2016-09-11 DIAGNOSIS — I480 Paroxysmal atrial fibrillation: Secondary | ICD-10-CM

## 2016-09-11 DIAGNOSIS — S50311A Abrasion of right elbow, initial encounter: Secondary | ICD-10-CM | POA: Diagnosis present

## 2016-09-11 LAB — ETHANOL: Alcohol, Ethyl (B): <5 mg/dL (ref <5)

## 2016-09-11 LAB — CBC
HCT: 44.4 % (ref 36.0–46.0)
Hemoglobin: 15.5 g/dL — ABNORMAL HIGH (ref 12.0–15.0)
MCH: 33.3 pg (ref 26.0–34.0)
MCHC: 34.9 g/dL (ref 30.0–36.0)
MCV: 95.5 fL (ref 78.0–100.0)
Platelets: 216 10*3/uL (ref 150–400)
RBC: 4.65 MIL/uL (ref 3.87–5.11)
RDW: 12.9 % (ref 11.5–15.5)
WBC: 8.7 10*3/uL (ref 4.0–10.5)

## 2016-09-11 LAB — URINALYSIS, ROUTINE W REFLEX MICROSCOPIC
Bilirubin Urine: NEGATIVE
Glucose, UA: NEGATIVE mg/dL
Ketones, ur: 15 mg/dL — AB
Nitrite: NEGATIVE
Protein, ur: NEGATIVE mg/dL
Specific Gravity, Urine: <1.005 — ABNORMAL LOW (ref 1.005–1.030)
pH: 6 (ref 5.0–8.0)

## 2016-09-11 LAB — URINE MICROSCOPIC-ADD ON
Bacteria, UA: NONE SEEN
RBC / HPF: NONE SEEN RBC/hpf (ref 0–5)
WBC, UA: NONE SEEN WBC/hpf (ref 0–5)

## 2016-09-11 LAB — BASIC METABOLIC PANEL
Anion gap: 12 (ref 5–15)
BUN: 6 mg/dL (ref 6–20)
CO2: 25 mmol/L (ref 22–32)
Calcium: 9.5 mg/dL (ref 8.9–10.3)
Chloride: 99 mmol/L — ABNORMAL LOW (ref 101–111)
Creatinine, Ser: 0.66 mg/dL (ref 0.44–1.00)
GFR calc Af Amer: >60 mL/min (ref >60)
GFR calc non Af Amer: >60 mL/min (ref >60)
Glucose, Bld: 90 mg/dL (ref 65–99)
Potassium: 3.4 mmol/L — ABNORMAL LOW (ref 3.5–5.1)
Sodium: 136 mmol/L (ref 135–145)

## 2016-09-11 MED ORDER — LORAZEPAM 1 MG PO TABS
1.0000 mg | ORAL_TABLET | Freq: Once | ORAL | Status: AC
  Administered 2016-09-11: 1 mg via ORAL
  Filled 2016-09-11: qty 1

## 2016-09-11 MED ORDER — SODIUM CHLORIDE 0.9 % IV BOLUS (SEPSIS)
1000.0000 mL | Freq: Once | INTRAVENOUS | Status: AC
  Administered 2016-09-11: 1000 mL via INTRAVENOUS

## 2016-09-11 MED ORDER — LORAZEPAM 1 MG PO TABS: 0.0000 mg | ORAL_TABLET | Freq: Two times a day (BID) | ORAL | Status: DC

## 2016-09-11 MED ORDER — APIXABAN 2.5 MG PO TABS
2.5000 mg | ORAL_TABLET | Freq: Two times a day (BID) | ORAL | Status: DC
  Administered 2016-09-11 – 2016-09-17 (×11): 2.5 mg via ORAL
  Filled 2016-09-11 (×15): qty 1

## 2016-09-11 MED ORDER — LORAZEPAM 1 MG PO TABS
0.0000 mg | ORAL_TABLET | Freq: Four times a day (QID) | ORAL | Status: DC
  Administered 2016-09-13: 1 mg via ORAL
  Filled 2016-09-11: qty 1

## 2016-09-11 MED ORDER — DILTIAZEM HCL ER COATED BEADS 120 MG PO CP24
120.0000 mg | ORAL_CAPSULE | Freq: Every day | ORAL | Status: DC
  Administered 2016-09-11: 120 mg via ORAL
  Filled 2016-09-11: qty 1

## 2016-09-11 MED ORDER — ADULT MULTIVITAMIN W/MINERALS CH
1.0000 | ORAL_TABLET | Freq: Every day | ORAL | Status: DC
  Administered 2016-09-13 – 2016-09-17 (×5): 1 via ORAL
  Filled 2016-09-11 (×6): qty 1

## 2016-09-11 MED ORDER — THIAMINE HCL 100 MG/ML IJ SOLN
100.0000 mg | Freq: Once | INTRAMUSCULAR | Status: AC
  Administered 2016-09-11: 100 mg via INTRAVENOUS
  Filled 2016-09-11: qty 2

## 2016-09-11 MED ORDER — LISINOPRIL 20 MG PO TABS
20.0000 mg | ORAL_TABLET | Freq: Every day | ORAL | Status: DC
  Administered 2016-09-11: 20 mg via ORAL
  Filled 2016-09-11 (×2): qty 1

## 2016-09-11 MED ORDER — ADULT MULTIVITAMIN W/MINERALS CH
1.0000 | ORAL_TABLET | Freq: Once | ORAL | Status: AC
  Administered 2016-09-11: 1 via ORAL
  Filled 2016-09-11: qty 1

## 2016-09-11 MED ORDER — FOLIC ACID 1 MG PO TABS
1.0000 mg | ORAL_TABLET | Freq: Every day | ORAL | Status: DC
  Administered 2016-09-11 – 2016-09-17 (×6): 1 mg via ORAL
  Filled 2016-09-11 (×7): qty 1

## 2016-09-11 NOTE — ED Notes (Signed)
Pt c/o generalized weakness x1 week, denies pain.

## 2016-09-11 NOTE — ED Notes (Signed)
Went to obtain pt vitals, pt eating at this time.

## 2016-09-11 NOTE — ED Notes (Signed)
HOSPITAL BED ORDERED

## 2016-09-11 NOTE — ED Provider Notes (Signed)
[facility] DEPT Provider Note   CSN: [id number] Arrival date & time: 09/11/16  0941  By signing my name below, I, Sonum Patel, attest that this documentation has been prepared under the direction and in the presence of Virgel Manifold, MD. Electronically Signed: Sonum Patel, Education administrator. 09/11/16. 11:49 AM.  History   Chief Complaint Chief Complaint  Patient presents with  . Fall  . Weakness    The history is provided by the patient and a relative. No language interpreter was used.     HPI Comments: Lauren Maxwell is a 82 y.o. female who presents to the Emergency Department complaining of a fall that occurred last night. She states she was feeling weak and her right leg buckled, causing her to fall backwards, striking her had and bilateral elbows. She states she fell on the kitchen floor and scooted herself to a phone to call her son. She denies LOC. She has skin tears to bilateral elbows. Son states she has been experiencing the weakness and right leg buckling for nearly 1 year and it has progressively worsened. She had an MRI 3 weeks ago which showed a herniated disc and pinched nerve; she was given the option of wearing a brace or having surgery but she and family are not interested in surgery at this time. Son states she has had several falls over the last few weeks but lately she hasn't been able to get back up by herself. Per son, patient has a history of alcoholism and resumed drinking 6-7 years ago. Patient states she has 1 drink every day and has not been eating or drinking well over the last few months. She lives alone and son is not sure how much hard liquor she is drinking but is concerned it is more than patient is admitting. She also reports taking Diltiazem which she notes makes her dizzy. She denies HA, vomiting.   Past Medical History:  Diagnosis Date  . Anxiety and depression   . Ascending aortic aneurysm ([place])   . Follicular lymphoma ([place])   . Hypertension   . Smoking       Patient Active Problem List   Diagnosis Date Noted  . Increased MCV 05/20/2016  . Coronary artery disease involving native coronary artery of native heart without angina pectoris 09/02/2015  . Aortic dilatation ([place]) 09/02/2015  . Anticoagulation therapy not indicated 09/02/2015  . Chronic diastolic heart failure ([place]) 08/31/2015  . Atrial fibrillation with rapid ventricular response ([place]) 05/22/2015  . Lip lesion 05/19/2014  . Diarrhea 03/16/2012  . Smoking   . History of B-cell lymphoma     Past Surgical History:  Procedure Laterality Date  . colonoscopy    . thyroid nodule excision    . TONSILLECTOMY    . TUBAL LIGATION      OB History    No data available       Home Medications    Prior to Admission medications   Medication Sig Start Date End Date Taking? Authorizing Provider  CARTIA XT 120 MG 24 hr capsule take 1 capsule by mouth once daily 08/18/16   Sherran Needs, NP  ELIQUIS 2.5 MG TABS tablet take 1 tablet by mouth twice a day 01/29/16   Sherran Needs, NP  lisinopril (PRINIVIL,ZESTRIL) 20 MG tablet Take 20 mg by mouth 2 (two) times daily.     Historical Provider, MD  Multiple Vitamin (MULTIVITAMIN) tablet Take 1 tablet by mouth every other day. Centrum Silver     Historical Provider,  MD  sodium chloride (OCEAN) 0.65 % nasal spray Place 1 spray into the nose as needed. For nasal congestion.    Historical Provider, MD  Tiotropium Bromide Monohydrate 2.5 MCG/ACT AERS Inhale into the lungs every other day.    Historical Provider, MD    Family History Family History  Problem Relation Age of Onset  . Cancer Mother 88    colon cancer  . Cancer Maternal Aunt     multiple aunts have colon cancer  . Lung cancer Brother   . Other Brother     post tramactic stress disorder    Social History Social History  Substance Use Topics  . Smoking status: Current Every Day Smoker    Packs/day: 0.50    Years: 62.00  . Smokeless tobacco: Never Used  . Alcohol use  25.2 oz/week    42 Shots of liquor per week     Comment: 5-6 oz vodka yesterday afternoon     Allergies   Codeine   Review of Systems Review of Systems  Gastrointestinal: Negative for vomiting.  Skin: Positive for wound.  Neurological: Positive for weakness. Negative for headaches.  All other systems reviewed and are negative.    Physical Exam Updated Vital Signs BP 143/91   Pulse 92   Temp 98.8 F (37.1 C) (Oral)   Resp 21   Ht 5\' 5"  (1.651 m)   Wt 102 lb (46.3 kg)   SpO2 98%   BMI 16.97 kg/m   Physical Exam  Constitutional: She is oriented to person, place, and time. She appears well-developed and well-nourished. No distress.  Thin, cachectic   HENT:  Head: Normocephalic and atraumatic.  Eyes: EOM are normal.  Neck: Normal range of motion.  Cardiovascular: Normal rate, regular rhythm and normal heart sounds.   Pulmonary/Chest: Effort normal and breath sounds normal.  Abdominal: Soft. She exhibits no distension. There is no tenderness.  Musculoskeletal: Normal range of motion.  No bony tenderness.   Neurological: She is alert and oriented to person, place, and time.  Generalized weakness, no focal neurological deficits  Skin: Skin is warm and dry.  Scattered ecchymosis. Skin tears to bilateral elbows.   Psychiatric: She has a normal mood and affect. Judgment normal.  Nursing note and vitals reviewed.    ED Treatments / Results  DIAGNOSTIC STUDIES: Oxygen Saturation is 98% on RA, normal by my interpretation.    COORDINATION OF CARE: 11:38 AM Discussed treatment plan with pt at bedside and pt agreed to plan.   Labs (all labs ordered are listed, but only abnormal results are displayed) Labs Reviewed  BASIC METABOLIC PANEL - Abnormal; Notable for the following:       Result Value   Potassium 3.4 (*)    Chloride 99 (*)    All other components within normal limits  CBC - Abnormal; Notable for the following:    Hemoglobin 15.5 (*)    All other  components within normal limits  URINALYSIS, ROUTINE W REFLEX MICROSCOPIC (NOT AT [facility]) - Abnormal; Notable for the following:    Specific Gravity, Urine <1.005 (*)    Hgb urine dipstick SMALL (*)    Ketones, ur 15 (*)    Leukocytes, UA TRACE (*)    All other components within normal limits  URINE MICROSCOPIC-ADD ON - Abnormal; Notable for the following:    Squamous Epithelial / LPF 0-5 (*)    All other components within normal limits  ETHANOL    EKG  EKG Interpretation  Date/Time:  Monday September 12 2016 14:07:34 EDT Ventricular Rate:  67 PR Interval:  178 QRS Duration: 70 QT Interval:  432 QTC Calculation: 456 R Axis:   -64 Text Interpretation:  Normal sinus rhythm Left axis deviation RSR' or QR pattern in V1 suggests right ventricular conduction delay Abnormal ECG No significant change since last tracing Confirmed by YAO  MD, DAVID ([id number]) on 09/13/2016 6:13:20 PM       Radiology Ct Head Wo Contrast  Result Date: 09/11/2016 CLINICAL DATA:  Pain after fall. EXAM: CT HEAD WITHOUT CONTRAST TECHNIQUE: Contiguous axial images were obtained from the base of the skull through the vertex without intravenous contrast. COMPARISON:  Apr 25, 2016 FINDINGS: Brain: No subdural, epidural, or subarachnoid hemorrhage. Ventricles and sulci are stable. Cerebellum, brainstem, and basal cisterns are normal. No acute cortical ischemia or infarct. Moderate white matter changes again identified. No mass, mass effect, or midline shift. Vascular: Atherosclerotic changes seen in the intracranial carotid arteries. Skull: Normal. Negative for fracture or focal lesion. Sinuses/Orbits: No acute finding. Other: No other abnormalities. IMPRESSION: No acute intracranial abnormality. Electronically Signed   By: Dorise Bullion III M.D   On: 09/11/2016 11:00    Procedures Procedures (including critical care time)  Medications Ordered in ED Medications - No data to display   Initial Impression / Assessment  and Plan / ED Course  I have reviewed the triage vital signs and the nursing notes.  Pertinent labs & imaging results that were available during my care of the patient were reviewed by me and considered in my medical decision making (see chart for details).  Clinical Course    84yF with weakness. RLE "giving out" but this is chronic. I suspect alcohol use is playing a large part and that she down plays how much she actually drinks. She is unsafe to go home at this point though. She lives by herself. I do not have a clear medical reason for admission. The CT of her head is w/o acute abnormality. Benefit of continuing her on eliquis needs to be addressed in the setting of alcohol use and frequent falls.   Will have PT evaluate and get care management involved. Does not have safe disposition plan as of now.   Final Clinical Impressions(s) / ED Diagnoses   Final diagnoses:  Alcohol use  Physical deconditioning    New Prescriptions New Prescriptions   No medications on file    I personally preformed the services scribed in my presence. The recorded information has been reviewed is accurate. Virgel Manifold, MD.    Virgel Manifold, MD 09/14/16 [phone]

## 2016-09-11 NOTE — Evaluation (Signed)
Physical Therapy Evaluation Patient Details Name: Lauren Maxwell MRN: [id number] DOB: 12/16/1930 Today's Date: 09/11/2016   History of Present Illness  Pt presented to the Emergency Department complaining of a fall that occurred 09/10/16. She states she was feeling weak and her right leg buckled, causing her to fall backwards, striking her had and bilateral elbows.  Son stated she has been experiencing the weakness and right leg buckling for nearly 1 year and it has progressively worsened. She had an MRI 3 weeks ago which showed a herniated disc and pinched nerve; she was given the option of wearing a brace or having surgery but she and family are not interested in surgery at this time. Son states she has had several falls over the last few weeks but lately she hasn't been able to get back up by herself  Clinical Impression  Patient presents with generalized weakness throughout, right > left leading to dependencies in gait and transfers.  Patient requires assistance for all mobility.  Do not feel patient is safe to return home alone at this time due to risk of falls and recommend SNF.  Patient in agreement.  Patient will benefit from f/u PT at SNF to progress mobility and independence.  Patient needs assessment for RLE AFO as well.    Follow Up Recommendations SNF    Equipment Recommendations  None recommended by PT    Recommendations for Other Services       Precautions / Restrictions Precautions Precautions: Fall      Mobility  Bed Mobility Overal bed mobility: Needs Assistance Bed Mobility: Supine to Sit;Sit to Supine     Supine to sit: Min assist Sit to supine: Min assist   General bed mobility comments: min assist to bring legs off bed and to raise/lower shoulders from/to bed  Transfers Overall transfer level: Needs assistance Equipment used: Rolling walker (2 wheeled) Transfers: Sit to/from Stand Sit to Stand: Min assist;+2 physical assistance         General  transfer comment: patient required assist to power up; +2 for safety/balance as well.  Ambulation/Gait Ambulation/Gait assistance: Min assist Ambulation Distance (Feet): 5 Feet Assistive device: Rolling walker (2 wheeled) Gait Pattern/deviations: Step-to pattern;Decreased stride length Gait velocity: decreased   General Gait Details: patient ambulated ~5 feet and then reported she was unable to continue and needed to return to bed, patient too fatigued to continue.  Stairs            Wheelchair Mobility    Modified Rankin (Stroke Patients Only)       Balance Overall balance assessment: Needs assistance Sitting-balance support: Feet unsupported;No upper extremity supported Sitting balance-Leahy Scale: Fair     Standing balance support: Bilateral upper extremity supported;During functional activity Standing balance-Leahy Scale: Poor Standing balance comment: reliant on RW for balance                             Pertinent Vitals/Pain Pain Assessment: No/denies pain    Home Living Family/patient expects to be discharged to:: Skilled nursing facility                      Prior Function Level of Independence: Independent with assistive device(s)         Comments: has used RW for the past several weeks per patient     Hand Dominance        Extremity/Trunk Assessment   Upper Extremity Assessment: Overall [facility]  for tasks assessed           Lower Extremity Assessment: RLE deficits/detail;LLE deficits/detail RLE Deficits / Details: hip flex 3-/5; knee extension 3-/5, no active dorsiflexion noted LLE Deficits / Details: grossly at least 4/5     Communication   Communication: No difficulties  Cognition Arousal/Alertness: Awake/alert Behavior During Therapy: WFL for tasks assessed/performed Overall Cognitive Status: Within Functional Limits for tasks assessed                      General Comments      Exercises      Assessment/Plan    PT Assessment Patient needs continued PT services  PT Problem List Decreased strength;Decreased activity tolerance;Decreased balance;Decreased mobility;Cardiopulmonary status limiting activity          PT Treatment Interventions DME instruction;Gait training;Functional mobility training;Therapeutic activities;Therapeutic exercise;Balance training;Patient/family education    PT Goals (Current goals can be found in the Care Plan section)  Acute Rehab PT Goals Patient Stated Goal: to get stronger PT Goal Formulation: With patient Time For Goal Achievement: 09/25/16 Potential to Achieve Goals: Fair    Frequency Min 3X/week   Barriers to discharge Decreased caregiver support patient lives alone    Co-evaluation               End of Session Equipment Utilized During Treatment: Gait belt Activity Tolerance: Patient limited by fatigue Patient left: in bed;with call bell/phone within reach Nurse Communication: Mobility status    Functional Assessment Tool Used: clinical judgement Functional Limitation: Mobility: Walking and moving around Mobility: Walking and Moving Around Current Status [id number]): At least 40 percent but less than 60 percent impaired, limited or restricted Mobility: Walking and Moving Around Goal Status [phone]): At least 1 percent but less than 20 percent impaired, limited or restricted    Time: 1340-1400 PT Time Calculation (min) (ACUTE ONLY): 20 min   Charges:   PT Evaluation $PT Eval Moderate Complexity: 1 Procedure     PT G Codes:   PT G-Codes **NOT FOR INPATIENT CLASS** Functional Assessment Tool Used: clinical judgement Functional Limitation: Mobility: Walking and moving around Mobility: Walking and Moving Around Current Status [id number]): At least 40 percent but less than 60 percent impaired, limited or restricted Mobility: Walking and Moving Around Goal Status [phone]): At least 1 percent but less than 20 percent impaired,  limited or restricted    Shanna Cisco 09/11/2016, 2:56 PM  09/11/2016 [place], [place]

## 2016-09-11 NOTE — Care Management Note (Signed)
Case Management Note  Patient Details  Name: Lauren Maxwell MRN: [id number] Date of Birth: 1931-01-24  Subjective/Objective:  82 y.o. F seen in the ED after a fall which she tells me is the last of 4 over the past few months. Hx "pinched nerve" in her back, and foot drop  which caused her R  Leg  to "buckle". This is of great concern because she has been on anticoagulant Eliquis 2.5mg  po Bid since diagnosed with Afib with RVR in 2016. Pt tells me she lives alone in private residence and both her adult son and daughter live only minutes away. Granddaughters live in [place].  CM reviewed dangers of falling while taking anticoagulants, risk of fractures, etc. Pt seems open to having someone stay with her in her home and have HHPT provided by [facility]) if this is ordered by MD. Wilson Singer, MD  is concerned that pt is unable to ambulate.                  Action/Plan: Will continue to follow for [facility] needs. [facility] is on board when pt is ready for discharge. Home Instead resources have been given to pt.    Expected Discharge Date:                  Expected Discharge Plan:  Home/Self Care  In-House Referral:  Clinical Social Work  Discharge planning Services  CM Consult  Post Acute Care Choice:  Home Health Choice offered to:  Patient  DME Arranged:   (Has Cane, RW and Rollator) DME Agency:  NA  HH Arranged:  PT Industry Agency:  [place]  Status of Service:  In process, will continue to follow  If discussed at Long Length of Stay Meetings, dates discussed:    Additional Comments:  Delrae Sawyers, RN 09/11/2016, 2:32 PM

## 2016-09-11 NOTE — ED Notes (Signed)
Hospital bed ordered for patient.

## 2016-09-11 NOTE — ED Triage Notes (Signed)
Pt was transported from home by [facility] after her son came and checked on her this am. Pt states she has been weak and having intermittent "dizzy spells" for the last 3 days. Pt states last night while in her kitchen with her walker she fell back hitting her head on the floor. Pt denies any LOC. Pt also has skin tear to left and right elbow. Pt is alert and ox4 on arrival.

## 2016-09-12 ENCOUNTER — Observation Stay (HOSPITAL_COMMUNITY): Payer: Medicare Other

## 2016-09-12 DIAGNOSIS — R269 Unspecified abnormalities of gait and mobility: Secondary | ICD-10-CM | POA: Diagnosis not present

## 2016-09-12 DIAGNOSIS — Z789 Other specified health status: Secondary | ICD-10-CM | POA: Diagnosis not present

## 2016-09-12 DIAGNOSIS — R296 Repeated falls: Secondary | ICD-10-CM | POA: Insufficient documentation

## 2016-09-12 DIAGNOSIS — E871 Hypo-osmolality and hyponatremia: Secondary | ICD-10-CM | POA: Diagnosis present

## 2016-09-12 DIAGNOSIS — R001 Bradycardia, unspecified: Secondary | ICD-10-CM | POA: Diagnosis present

## 2016-09-12 DIAGNOSIS — I2510 Atherosclerotic heart disease of native coronary artery without angina pectoris: Secondary | ICD-10-CM | POA: Insufficient documentation

## 2016-09-12 DIAGNOSIS — I959 Hypotension, unspecified: Secondary | ICD-10-CM | POA: Diagnosis present

## 2016-09-12 DIAGNOSIS — E876 Hypokalemia: Secondary | ICD-10-CM | POA: Diagnosis present

## 2016-09-12 DIAGNOSIS — I5032 Chronic diastolic (congestive) heart failure: Secondary | ICD-10-CM | POA: Diagnosis not present

## 2016-09-12 DIAGNOSIS — F1010 Alcohol abuse, uncomplicated: Secondary | ICD-10-CM | POA: Diagnosis not present

## 2016-09-12 DIAGNOSIS — R5381 Other malaise: Secondary | ICD-10-CM

## 2016-09-12 DIAGNOSIS — R531 Weakness: Secondary | ICD-10-CM

## 2016-09-12 DIAGNOSIS — Z7289 Other problems related to lifestyle: Secondary | ICD-10-CM | POA: Insufficient documentation

## 2016-09-12 LAB — FOLATE: FOLATE: 21 ng/mL (ref >5.9)

## 2016-09-12 LAB — VITAMIN B12: VITAMIN B 12: 488 pg/mL (ref 180–914)

## 2016-09-12 LAB — COMPREHENSIVE METABOLIC PANEL WITH GFR
ALT: 22 U/L (ref 14–54)
AST: 33 U/L (ref 15–41)
Albumin: 3.8 g/dL (ref 3.5–5.0)
Alkaline Phosphatase: 63 U/L (ref 38–126)
Anion gap: 11 (ref 5–15)
BUN: 6 mg/dL (ref 6–20)
CO2: 24 mmol/L (ref 22–32)
Calcium: 9.3 mg/dL (ref 8.9–10.3)
Chloride: 97 mmol/L — ABNORMAL LOW (ref 101–111)
Creatinine, Ser: 0.61 mg/dL (ref 0.44–1.00)
GFR calc Af Amer: >60 mL/min (ref >60)
GFR calc non Af Amer: >60 mL/min (ref >60)
Glucose, Bld: 121 mg/dL — ABNORMAL HIGH (ref 65–99)
Potassium: 3 mmol/L — ABNORMAL LOW (ref 3.5–5.1)
Sodium: 132 mmol/L — ABNORMAL LOW (ref 135–145)
Total Bilirubin: 1.9 mg/dL — ABNORMAL HIGH (ref 0.3–1.2)
Total Protein: 5.7 g/dL — ABNORMAL LOW (ref 6.5–8.1)

## 2016-09-12 LAB — OSMOLALITY: Osmolality: 270 mOsm/kg — ABNORMAL LOW (ref 275–295)

## 2016-09-12 LAB — TSH: TSH: 2.227 u[IU]/mL (ref 0.350–4.500)

## 2016-09-12 MED ORDER — ACETAMINOPHEN 650 MG RE SUPP: 650.0000 mg | Freq: Four times a day (QID) | RECTAL | Status: DC | PRN

## 2016-09-12 MED ORDER — ONDANSETRON HCL 4 MG PO TABS
4.0000 mg | ORAL_TABLET | Freq: Four times a day (QID) | ORAL | Status: DC | PRN
  Filled 2016-09-12: qty 1

## 2016-09-12 MED ORDER — POTASSIUM CHLORIDE CRYS ER 20 MEQ PO TBCR
40.0000 meq | EXTENDED_RELEASE_TABLET | ORAL | Status: AC
  Administered 2016-09-12: 40 meq via ORAL
  Filled 2016-09-12: qty 2

## 2016-09-12 MED ORDER — ONDANSETRON HCL 4 MG/2ML IJ SOLN: 4.0000 mg | Freq: Four times a day (QID) | INTRAMUSCULAR | Status: DC | PRN

## 2016-09-12 MED ORDER — SODIUM CHLORIDE 0.9 % IV SOLN
INTRAVENOUS | Status: DC
  Administered 2016-09-12: 18:00:00 via INTRAVENOUS

## 2016-09-12 MED ORDER — SODIUM CHLORIDE 0.9% FLUSH
3.0000 mL | Freq: Two times a day (BID) | INTRAVENOUS | Status: DC
  Administered 2016-09-13 – 2016-09-16 (×6): 3 mL via INTRAVENOUS

## 2016-09-12 MED ORDER — ACETAMINOPHEN 325 MG PO TABS: 650.0000 mg | ORAL_TABLET | Freq: Four times a day (QID) | ORAL | Status: DC | PRN

## 2016-09-12 MED ORDER — SODIUM CHLORIDE 0.9 % IV BOLUS (SEPSIS)
500.0000 mL | Freq: Once | INTRAVENOUS | Status: AC
  Administered 2016-09-12: 500 mL via INTRAVENOUS

## 2016-09-12 MED ORDER — SENNOSIDES-DOCUSATE SODIUM 8.6-50 MG PO TABS
1.0000 | ORAL_TABLET | Freq: Every evening | ORAL | Status: DC | PRN
  Administered 2016-09-13: 1 via ORAL
  Filled 2016-09-12 (×2): qty 1

## 2016-09-12 NOTE — ED Notes (Addendum)
Pt awake and watching TV; remains pleasant.

## 2016-09-12 NOTE — Consult Note (Signed)
Cardiology Consult    Patient ID: PYPER DAGES MRN: [id number], DOB/AGE: 08/20/1931   Admit date: 09/11/2016 Date of Consult: 09/12/2016  Primary Physician: Mayra Neer, MD Reason for Consult: Bradycardia Primary Cardiologist: Dr. Tamala Julian Requesting Provider: Dr. Marily Memos   History of Present Illness    Lauren Maxwell is a 82 y.o. female with past medical history of PAF (on Eliquis), chronic diastolic CHF, CAD noted on CT in 2014, dilated ascending aorta, HTN, alcohol abuse, and tobacco use who presented to [facility] ED on 09/11/2016 for evaluation of dizziness and a recent fall.   She reports walking around her home when she fell while using her walker. Developed weakness along her lower extremities and fell backwards.  Reported hitting her head but denied a loss of consciousness. She has been experiencing weakness for several months and recently was diagnosed with a foraminal stenosis and a herniated disc but reported not being interested in pursuing surgery at this time.  No prior cardiac workup for CAD besides being noted on CT Imaging in 2014. No recent chest discomfort, palpitations, or dyspnea with exertion.   Prior to admission medications include Cartia XT 120mg  daily, Eliquis 2.5mg  BID, and Lisinopril 20mg  BID.   While in the ED, she has been noted to be bradycardiac in the 40's. HR is currently in the 70's. No significant pauses are noted on telemetry. Labs show a WBC of 8.7, Hgb 15.5, and platelets 216. K+ 3.4 and creatinine 0.66. Ethanol < 5. TSH 2.227. CT Head on admission was without acute intracranial abnormalities. EKG shows NSR, HR 67, LAD, with no acute ST or T-wave changes.   Does consume between 3-5 oz of liquor per day. Has a 31 pack-year tobacco history. No known recreational drug use.    Past Medical History   Past Medical History:  Diagnosis Date  . Anxiety and depression   . Ascending aortic aneurysm ([place])   . Follicular lymphoma ([place])   .  Hypertension   . Smoking     Past Surgical History:  Procedure Laterality Date  . colonoscopy    . thyroid nodule excision    . TONSILLECTOMY    . TUBAL LIGATION       Allergies  Allergies  Allergen Reactions  . Codeine Nausea And Vomiting    Inpatient Medications    . apixaban  2.5 mg Oral BID  . folic acid  1 mg Oral Daily  . LORazepam  0-4 mg Oral Q6H   Followed by  . [START ON 09/13/2016] LORazepam  0-4 mg Oral Q12H  . multivitamin with minerals  1 tablet Oral Daily  . potassium chloride  40 mEq Oral Q4H  . sodium chloride flush  3 mL Intravenous Q12H    Family History    Family History  Problem Relation Age of Onset  . Cancer Mother 75    colon cancer  . Cancer Maternal Aunt     multiple aunts have colon cancer  . Lung cancer Brother   . Other Brother     post tramactic stress disorder    Social History    Social History   Social History  . Marital status: Divorced    Spouse name: N/A  . Number of children: N/A  . Years of education: N/A   Occupational History  . Not on file.   Social History Main Topics  . Smoking status: Current Every Day Smoker    Packs/day: 0.50    Years: 62.00  .  Smokeless tobacco: Never Used  . Alcohol use 25.2 oz/week    42 Shots of liquor per week     Comment: 5-6 oz vodka yesterday afternoon  . Drug use: No  . Sexual activity: Not on file     Comment: MPOA son Darl Fullerton   Other Topics Concern  . Not on file   Social History Narrative  . No narrative on file     Review of Systems    General:  No chills, fever, night sweats or weight changes. Positive for recent fall.  Cardiovascular:  No chest pain, dyspnea on exertion, edema, orthopnea, palpitations, paroxysmal nocturnal dyspnea. Dermatological: No rash, lesions/masses Respiratory: No cough, dyspnea Urologic: No hematuria, dysuria Abdominal:   No nausea, vomiting, diarrhea, bright red blood per rectum, melena, or hematemesis Neurologic:  No visual  changes, wkns, changes in mental status. Positive for dizziness.  All other systems reviewed and are otherwise negative except as noted above.  Physical Exam    Blood pressure 141/74, pulse 72, temperature 98.8 F (37.1 C), temperature source Oral, resp. rate 20, height 5\' 5"  (1.651 m), weight 102 lb (46.3 kg), SpO2 94 %.  General: Pleasant, Caucasian female appearing in NAD Psych: Normal affect. Neuro: Alert and oriented X 3. Moves all extremities spontaneously. HEENT: Normal  Neck: Supple without bruits or JVD. Lungs:  Resp regular and unlabored, CTA without wheezing or rales. Heart: RRR no s3, s4, or murmurs. Abdomen: Soft, non-tender, non-distended, BS + x 4.  Extremities: No clubbing, cyanosis or edema. DP/PT/Radials 2+ and equal bilaterally.  Labs    Troponin (Point of Care Test) No results for input(s): TROPIPOC in the last 72 hours. No results for input(s): CKTOTAL, CKMB, TROPONINI in the last 72 hours. Lab Results  Component Value Date   WBC 8.7 09/11/2016   HGB 15.5 (H) 09/11/2016   HCT 44.4 09/11/2016   MCV 95.5 09/11/2016   PLT 216 09/11/2016    Recent Labs Lab 09/12/16 1328  NA 132*  K 3.0*  CL 97*  CO2 24  BUN 6  CREATININE 0.61  CALCIUM 9.3  PROT 5.7*  BILITOT 1.9*  ALKPHOS 63  ALT 22  AST 33  GLUCOSE 121*   Lab Results  Component Value Date   CHOL 169 09/16/2015   HDL 89 09/16/2015   LDLCALC 71 09/16/2015   TRIG 43 09/16/2015   No results found for: [facility]   Radiology Studies    Ct Head Wo Contrast  Result Date: 09/11/2016 CLINICAL DATA:  Pain after fall. EXAM: CT HEAD WITHOUT CONTRAST TECHNIQUE: Contiguous axial images were obtained from the base of the skull through the vertex without intravenous contrast. COMPARISON:  Apr 25, 2016 FINDINGS: Brain: No subdural, epidural, or subarachnoid hemorrhage. Ventricles and sulci are stable. Cerebellum, brainstem, and basal cisterns are normal. No acute cortical ischemia or infarct. Moderate white  matter changes again identified. No mass, mass effect, or midline shift. Vascular: Atherosclerotic changes seen in the intracranial carotid arteries. Skull: Normal. Negative for fracture or focal lesion. Sinuses/Orbits: No acute finding. Other: No other abnormalities. IMPRESSION: No acute intracranial abnormality. Electronically Signed   By: Dorise Bullion III M.D   On: 09/11/2016 11:00    EKG & Cardiac Imaging    EKG: NSR, HR 67, LAD, with no acute ST or T-wave changes.    Echocardiogram: 05/2015 Study Conclusions  - Left ventricle: The cavity size was normal. Wall thickness was   increased in a pattern of mild LVH. There was moderate  focal   basal hypertrophy of the septum. Systolic function was normal.   The estimated ejection fraction was in the range of 60% to 65%.   Wall motion was normal; there were no regional wall motion   abnormalities. Doppler parameters are consistent with abnormal   left ventricular relaxation (grade 1 diastolic dysfunction). The   E/e&' ratio is <8, suggesting normal LV filling pressure. - Mitral valve: Calcified annulus. - Left atrium: The atrium was normal in size. - Right atrium: The atrium was normal in size. - Tricuspid valve: There was trivial regurgitation. - Pulmonary arteries: PA peak pressure: 15 mm Hg (S). - Inferior vena cava: The vessel was normal in size. The   respirophasic diameter changes were in the normal range (>= 50%),   consistent with normal central venous pressure.  Impressions:  - LVEF 60-65%, mild LVH, moderate basal septal hypertrophy, normal   wall motion, diastolic dysfunction, normal LV filling pressure,   normal biatrial sizes, normal RVSP.  Assessment & Plan    1. Sinus Bradycardia - presented after a fall at home in which she reported weakness along her lower extremities. Reported hitting her head but denied a loss of consciousness. No prodromal symptoms noted. - HR has been variable in 40's - 70's while in the  ED. No significant pauses on telemetry. Agree with holding Cardizem for now. Continue to monitor on telemetry while admitted.   2. Hypotension - BP has been variable at 94/56 - 167/105 in the past 24 hours. - Cardizem and Lisinopril currently held.   3. Paroxysmal Atrial Fibrillation - no evidence of recurrence this admission. - This patients CHA2DS2-VASc Score and unadjusted Ischemic Stroke Rate (% per year) is equal to 7.2 % stroke rate/year from a score of 5 (CHF, HTN, Female, Age (2)). Continue Eliquis for anticoagulation. Reduced dosing in setting of weight of 46 kg and age of 35. - Cardizem held in setting of bradycardia and hypotension.   4. Alcohol Use - consumes 3-5 oz of liquor per day. - currently on CIWA protocol. Per admitting team  5. Tobacco Use - cessation advised.   Signed, Erma Heritage, PA-C 09/12/2016, 2:58 PM Pager: [phone]

## 2016-09-12 NOTE — Discharge Planning (Signed)
EDCM notified of pt family wish to have pt placed in SNF.  EDCM reviewed chart to find that pt does not have recent hospital inpatient stay of 3 days.  [facility] notified RN that pt will probably have to be discharged home with home health.    EDCM notified SW Crawford Givens) of case.  SW is aware and will speak with family upon their arrival.

## 2016-09-12 NOTE — ED Notes (Signed)
Glasses located in the patients Bed.

## 2016-09-12 NOTE — Discharge Planning (Signed)
Pt family has arrived; [facility] notified SW.  EDCM and SW will speak to family together to come up with safe discharge plan for pt.

## 2016-09-12 NOTE — ED Provider Notes (Signed)
Patient with persistent dizziness. Unable to stand. Patient appears deconditioned. Expressing a suicidal thoughts. She's had episodic bradycardia with blood pressure drops into the 90's. Discussed with neurology who will evaluate patient. Discussed also with [facility] hospitalist who will admit to telemetry bed.   Julianne Rice, MD 09/12/16 1252

## 2016-09-12 NOTE — ED Notes (Signed)
Patient transported to MRI 

## 2016-09-12 NOTE — ED Notes (Signed)
EMT at bedside for EKG and Orthostatics.

## 2016-09-12 NOTE — ED Notes (Signed)
MD at bedside. [place] 

## 2016-09-12 NOTE — ED Notes (Signed)
MD at bedside. 

## 2016-09-12 NOTE — ED Notes (Signed)
Pt still in MRI. Report given to floor. Pt to be transported up to floor once she returns to the Unit.

## 2016-09-12 NOTE — H&P (Signed)
History and Physical    Lauren Maxwell [id number] DOB: [birth year] DOA: 09/11/2016  PCP: Mayra Neer, MD Patient coming from: home  Chief Complaint: fall  HPI: Lauren Maxwell is an unpleasant 82 y.o. female with medical history significant for COPD, hypertension, EtOH abuse, anxiety since emergency department complaint of a fall last night. Initial evaluation reveals a year to thrive symptomatic bradycardia.  Information is obtained from the patient. She said she was standing in the kitchen getting rated take her medications she leg off walker and fell backwards. She says she hit her head but did not lose consciousness. She called her son help her get up off the floor. She denies any headache dizziness syncope or near-syncope. She denies chest pain palpitations shortness of breath cough fever chills. She denies abdominal pain nausea vomiting diarrhea constipation. She does report some chronic left shoulder pain and had an MRI 3 weeks ago which revealed "herniated disc". She states she was given an option of surgery but declined. Patient also has a history of alcoholism. He denies dysuria hematuria frequency or urgency. She denies diarrhea constipation melena or bright red blood per rectum.    ED Course: In her 27 hours in the emergency department is afebrile with a somewhat labile blood pressure and bradycardia in the 40s to 50s. She is not hypoxic or toxic appearing  Review of Systems: As per HPI otherwise 10 point review of systems negative.   Ambulatory Status:  Ambulates with a walker and an unsteady gait  Past Medical History:  Diagnosis Date  . Anxiety and depression   . Ascending aortic aneurysm ([place])   . Follicular lymphoma ([place])   . Hypertension   . Smoking     Past Surgical History:  Procedure Laterality Date  . colonoscopy    . thyroid nodule excision    . TONSILLECTOMY    . TUBAL LIGATION      Social History   Social History  . Marital status:  Divorced    Spouse name: N/A  . Number of children: N/A  . Years of education: N/A   Occupational History  . Not on file.   Social History Main Topics  . Smoking status: Current Every Day Smoker    Packs/day: 0.50    Years: 62.00  . Smokeless tobacco: Never Used  . Alcohol use 25.2 oz/week    42 Shots of liquor per week     Comment: 5-6 oz vodka yesterday afternoon  . Drug use: No  . Sexual activity: Not on file     Comment: MPOA son Ruperta Redler   Other Topics Concern  . Not on file   Social History Narrative  . No narrative on file    Allergies  Allergen Reactions  . Codeine Nausea And Vomiting    Family History  Problem Relation Age of Onset  . Cancer Mother 5    colon cancer  . Cancer Maternal Aunt     multiple aunts have colon cancer  . Lung cancer Brother   . Other Brother     post tramactic stress disorder    Prior to Admission medications   Medication Sig Start Date End Date Taking? Authorizing Provider  CARTIA XT 120 MG 24 hr capsule take 1 capsule by mouth once daily 08/18/16  Yes Sherran Needs, NP  ELIQUIS 2.5 MG TABS tablet take 1 tablet by mouth twice a day 01/29/16  Yes Sherran Needs, NP  lisinopril (PRINIVIL,ZESTRIL) 20 MG tablet Take 20 mg  by mouth 2 (two) times daily.    Yes Historical Provider, MD  Multiple Vitamin (MULTIVITAMIN) tablet Take 1 tablet by mouth every other day. Centrum Silver    Yes Historical Provider, MD  sodium chloride (OCEAN) 0.65 % nasal spray Place 1 spray into the nose as needed. For nasal congestion.   Yes Historical Provider, MD    Physical Exam: Vitals:   09/12/16 0830 09/12/16 0849 09/12/16 0930 09/12/16 1000  BP: 141/74     Pulse: 68 79  72  Resp: 21  20   Temp:      TempSrc:      SpO2: (!) 87% 98%  94%  Weight:      Height:         General:  Appears thin frail, chronically ill and irritable Eyes:  PERRL, EOMI, normal lids, iris ENT:  grossly normal hearing, lips & tongue, mmm Neck:  no LAD, masses  or thyromegaly Cardiovascular:  RRR, no m/r/g. No LE edema.  Respiratory:  CTA bilaterally, no w/r/r. Normal respiratory effort. Abdomen:  soft, ntnd, positive bowel sounds throughout no guarding or rebounding Skin:  no rash or induration seen on limited exam, several skin tears data and ecchymosis Musculoskeletal:  grossly normal tone BUE/BLE, good ROM, no bony abnormality Psychiatric:  Irritable as I contact answers questions very reluctantly Neurologic:  CN 2-12 grossly intact, moves all extremities in coordinated fashion, sensation intact  Labs on Admission: I have personally reviewed following labs and imaging studies  CBC:  Recent Labs Lab 09/11/16 1200  WBC 8.7  HGB 15.5*  HCT 44.4  MCV 95.5  PLT 123XX123   Basic Metabolic Panel:  Recent Labs Lab 09/11/16 1200  NA 136  K 3.4*  CL 99*  CO2 25  GLUCOSE 90  BUN 6  CREATININE 0.66  CALCIUM 9.5   GFR: Estimated Creatinine Clearance: 39.6 mL/min (by C-G formula based on SCr of 0.66 mg/dL). Liver Function Tests: No results for input(s): AST, ALT, ALKPHOS, BILITOT, PROT, ALBUMIN in the last 168 hours. No results for input(s): LIPASE, AMYLASE in the last 168 hours. No results for input(s): AMMONIA in the last 168 hours. Coagulation Profile: No results for input(s): INR, PROTIME in the last 168 hours. Cardiac Enzymes: No results for input(s): CKTOTAL, CKMB, CKMBINDEX, TROPONINI in the last 168 hours. BNP (last 3 results) No results for input(s): PROBNP in the last 8760 hours. HbA1C: No results for input(s): HGBA1C in the last 72 hours. CBG: No results for input(s): GLUCAP in the last 168 hours. Lipid Profile: No results for input(s): CHOL, HDL, LDLCALC, TRIG, CHOLHDL, LDLDIRECT in the last 72 hours. Thyroid Function Tests: No results for input(s): TSH, T4TOTAL, FREET4, T3FREE, THYROIDAB in the last 72 hours. Anemia Panel: No results for input(s): VITAMINB12, FOLATE, FERRITIN, TIBC, IRON, RETICCTPCT in the last 72  hours. Urine analysis:    Component Value Date/Time   COLORURINE YELLOW 09/11/2016 [place] 09/11/2016 1117   LABSPEC <1.005 (L) 09/11/2016 1117   PHURINE 6.0 09/11/2016 1117   GLUCOSEU NEGATIVE 09/11/2016 1117   HGBUR SMALL (A) 09/11/2016 1117   BILIRUBINUR NEGATIVE 09/11/2016 1117   KETONESUR 15 (A) 09/11/2016 1117   PROTEINUR NEGATIVE 09/11/2016 1117   NITRITE NEGATIVE 09/11/2016 1117   LEUKOCYTESUR TRACE (A) 09/11/2016 1117    Creatinine Clearance: Estimated Creatinine Clearance: 39.6 mL/min (by C-G formula based on SCr of 0.66 mg/dL).  Sepsis Labs: @LABRCNTIP (procalcitonin:4,lacticidven:4) )No results found for this or any previous visit (from the past 240  hour(s)).   Radiological Exams on Admission: Ct Head Wo Contrast  Result Date: 09/11/2016 CLINICAL DATA:  Pain after fall. EXAM: CT HEAD WITHOUT CONTRAST TECHNIQUE: Contiguous axial images were obtained from the base of the skull through the vertex without intravenous contrast. COMPARISON:  Apr 25, 2016 FINDINGS: Brain: No subdural, epidural, or subarachnoid hemorrhage. Ventricles and sulci are stable. Cerebellum, brainstem, and basal cisterns are normal. No acute cortical ischemia or infarct. Moderate white matter changes again identified. No mass, mass effect, or midline shift. Vascular: Atherosclerotic changes seen in the intracranial carotid arteries. Skull: Normal. Negative for fracture or focal lesion. Sinuses/Orbits: No acute finding. Other: No other abnormalities. IMPRESSION: No acute intracranial abnormality. Electronically Signed   By: Dorise Bullion III M.D   On: 09/11/2016 11:00    EKG: Independently reviewed. Normal sinus rhythm Left axis deviation RSR' or QR pattern in  Assessment/Plan Principal Problem:   Bradycardia Active Problems:   Smoking   Chronic diastolic heart failure (HCC)   Coronary artery disease involving native coronary artery of native heart without angina pectoris    Anticoagulation therapy not indicated   Generalized weakness   Hypotension   Hypokalemia   #1. Bradycardia. Symptomatic. Etiology unclear. I medications include diltiazem. EKG without acute changes. No chest pain. No shortness of breath -Admit to telemetry -Hold diltiazem -TSH -Cardiology consult  #2. Hypotension. Likely cleaned related to medications in the setting of to to thrive -Gentle IV fluids -Lisinopril and diltiazem -Obtain orthostatic vital signs -Monitor closely  #3. Hypokalemia. Potassium level 3.0 on admission. Likely related to decreased oral intake in the setting of EtOH use -Obtain a magnesium level -Replete -Recheck  #4. Chronic diastolic heart failure. Appears compensated. Home medications include lisinopril. Echo done last year reveals a mild LVH with an EF of 60% and grade 1 diastolic dysfunction -Daily weights -Monitor urine output  #5. Hyponatremia. Likely related to decreased oral intake in the setting of EtOH use. Sodium level CXXXII. -Gentle IV fluids -Will obtain serum osmolality -Check urine sodium and urine osmolality -Recheck in the morning  #6. EtOH use. Patient reports 1-2 drinks a day. No signs symptoms of withdrawals on admission -CIWA protocol -Monitor  7. CAD. She denies chest pain. EKG without acute changes. -Monitor. -See #1  #8. Tobacco use. -Cessation counseling offered  #9. Generalized weakness/failure to thrive/frequent falls. In the setting of chronic anxiety/depression and ETOH use. No sign of infectious process. Mild metabolic derangements. -MRI c-spine, lumbar spine and thoracic spine per neuro -IV fluids to address metabolic defecits -kdur -PT/OT  -Case management -Physical therapy -Social work -Nutritional consult     DVT prophylaxis: lovenox  Code Status: full  Family Communication: daughter and son at bedside  Disposition Plan: unclear may benefit rehab  Consults called: none  Admission status: obs     Dyanne Carrel M MD [facility]  If 7PM-7AM, please contact night-coverage [email or web address] Password [email or web address]  09/12/2016, 1:45 PM

## 2016-09-12 NOTE — Progress Notes (Signed)
Patient arrived on the unit from the ER on a stretcher, vital sign obtained, patient oreinted to room and staff, bed in lowest position , call light within reach will continue to monitor

## 2016-09-12 NOTE — Consult Note (Signed)
NEURO HOSPITALIST CONSULT NOTE   Requestig physician: ED MD   Reason for Consult: LE weakness and falls   History obtained from:  Patient    HPI:                                                                                                                                          Lauren Maxwell is an 82 y.o. female with history of smoking, hypertension, right dropfoot, and follicular lymphoma. Patient has been cancer free for many years per patient. Over the last 2 weeks patient isan increase in her falls. She states that she has a right drop foot and often times although she uses a walker will trip on the rug or over the door frame which causes her to fall. However she also states over the last 2-3 weeks she has basically given up does not want to continue trying to improve herself as she is feels very depressed. She states that she is not been mobile in the majority of her day is spent laying on the couch and only getting up 2-4 times a day. Son who is also in the room states that he has noted she has had a significant decrease in oral intake. He states that he does the grocery shopping and has noted that she has not been eating as much. Family who is in the room has noticed that she has lost weight in addition has also been depressed. Patient herself has noted that upon waking in the morning she feels her blood pressure is very low and at times this is when she feels as though she is lightheaded and has blurred vision. She notes the lightheadedness oftentimes occurs when she is getting up from a seated position but rarely occurs while she is lying down. When discussing with family and patient failure to thrive patient admits that she definitely feels this way.  Patient also notes that her lightheaded sensation upon standing has been more prominent if not occurred when she was started on medication for her A. fib approximately one year ago.  Patient denies any vertigo,  room spinning, numbness, tingling, headache.  Past Medical History:  Diagnosis Date  . Anxiety and depression   . Ascending aortic aneurysm ([place])   . Follicular lymphoma ([place])   . Hypertension   . Smoking     Past Surgical History:  Procedure Laterality Date  . colonoscopy    . thyroid nodule excision    . TONSILLECTOMY    . TUBAL LIGATION      Family History  Problem Relation Age of Onset  . Cancer Mother 18    colon cancer  . Cancer Maternal Aunt     multiple aunts have colon cancer  . Lung cancer Brother   .  Other Brother     post tramactic stress disorder     Social History:  reports that she has been smoking.  She has a 31.00 pack-year smoking history. She has never used smokeless tobacco. She reports that she drinks about 25.2 oz of alcohol per week . She reports that she does not use drugs.  Allergies  Allergen Reactions  . Codeine Nausea And Vomiting    MEDICATIONS:                                                                                                                     Current Facility-Administered Medications  Medication Dose Route Frequency Provider Last Rate Last Dose  . apixaban (ELIQUIS) tablet 2.5 mg  2.5 mg Oral BID Virgel Manifold, MD   2.5 mg at 09/11/16 1922  . diltiazem (CARDIZEM CD) 24 hr capsule 120 mg  120 mg Oral Daily Virgel Manifold, MD   120 mg at 09/11/16 1736  . folic acid (FOLVITE) tablet 1 mg  1 mg Oral Daily Virgel Manifold, MD   1 mg at 09/11/16 1212  . lisinopril (PRINIVIL,ZESTRIL) tablet 20 mg  20 mg Oral Daily Virgel Manifold, MD   20 mg at 09/11/16 1736  . LORazepam (ATIVAN) tablet 0-4 mg  0-4 mg Oral Q6H Virgel Manifold, MD       Followed by  . [START ON 09/13/2016] LORazepam (ATIVAN) tablet 0-4 mg  0-4 mg Oral Q12H Virgel Manifold, MD      . multivitamin with minerals tablet 1 tablet  1 tablet Oral Daily Virgel Manifold, MD      . sodium chloride 0.9 % bolus 500 mL  500 mL Intravenous Once Julianne Rice, MD       Current  Outpatient Prescriptions  Medication Sig Dispense Refill  . CARTIA XT 120 MG 24 hr capsule take 1 capsule by mouth once daily 30 capsule 0  . ELIQUIS 2.5 MG TABS tablet take 1 tablet by mouth twice a day 60 tablet 6  . lisinopril (PRINIVIL,ZESTRIL) 20 MG tablet Take 20 mg by mouth 2 (two) times daily.     . Multiple Vitamin (MULTIVITAMIN) tablet Take 1 tablet by mouth every other day. Centrum Silver     . sodium chloride (OCEAN) 0.65 % nasal spray Place 1 spray into the nose as needed. For nasal congestion.        ROS:  History obtained from the patient  General ROS: negative for - chills, fatigue, fever, night sweats, weight gain or weight loss Psychological ROS: Positive for -depression Ophthalmic ROS: Positive for - blurry vision upon standing at times ENT ROS: negative for - epistaxis, nasal discharge, oral lesions, sore throat, tinnitus or vertigo Allergy and Immunology ROS: negative for - hives or itchy/watery eyes Hematological and Lymphatic ROS: negative for - bleeding problems, bruising or swollen lymph nodes Endocrine ROS: negative for - galactorrhea, hair pattern changes, polydipsia/polyuria or temperature intolerance Respiratory ROS: negative for - cough, hemoptysis, shortness of breath or wheezing Cardiovascular ROS: negative for - chest pain, dyspnea on exertion, edema or irregular heartbeat Gastrointestinal ROS: negative for - abdominal pain, diarrhea, hematemesis, nausea/vomiting or stool incontinence Genito-Urinary ROS: negative for - dysuria, hematuria, incontinence or urinary frequency/urgency Musculoskeletal ROS: Positive for -  muscular weakness Neurological ROS: as noted in HPI Dermatological ROS: negative for rash and skin lesion changes   Blood pressure 141/74, pulse 72, temperature 98.8 F (37.1 C), temperature source Oral, resp.  rate 20, height 5\' 5"  (1.651 m), weight 46.3 kg (102 lb), SpO2 94 %.   Neurologic Examination:                                                                                                      HEENT-  Normocephalic, no lesions, without obvious abnormality.  Normal external eye and conjunctiva.  Normal TM's bilaterally.  Normal auditory canals and external ears. Normal external nose, mucus membranes and septum.  Normal pharynx. Cardiovascular- pulses palpable throughout distal extremities Lungs- chest clear, no wheezing, rales, normal symmetric air entry, Heart exam - S1, S2 normal, no murmur, no gallop, rate regular Abdomen- soft, non-tender; bowel sounds normal; no masses,  no organomegaly Extremities- no edema Lymph-no adenopathy palpable Musculoskeletal-no joint tenderness, deformity or swelling Skin-warm and dry, no hyperpigmentation, vitiligo, or suspicious lesions  Neurological Examination Mental Status: Alert, oriented, thought content appropriate.  Speech dysarthric without evidence of aphasia.  Able to follow 3 step commands without difficulty. Cranial Nerves: II:  Visual fields grossly normal, pupils equal, round, reactive to light and accommodation III,IV, VI: ptosis not present, extra-ocular motions intact bilaterally V,VII: smile symmetric, facial light touch sensation normal bilaterally VIII: hearing normal bilaterally IX,X: uvula rises symmetrically XI: bilateral shoulder shrug XII: midline tongue extension. I do have some question if there may be some fasciculations of her tongue, but she was having difficulty holding it still and therefore I'm not certain if the movements that I saw were true fasciculations. Motor: Right : Upper extremity   5/5    Left:     Upper extremity   5/5  LE strength is symmetric, 3/5 hip flexors. 4/5 knee and hip extension, 3/5 ankle dorsiflexion  --Patient was able to stand with two-person assist, was unable to take a step secondary to  weakness, she used her upper extremities to lift her legs onto the bed but this is normal per son. --Dropfoot on the right Tone and bulk:normal tone throughout; no atrophy noted Sensory: Pinprick and light touch intact throughout,  bilaterally Deep Tendon Reflexes: 3+ brisk and symmetric throughout, prominent jaw jerk. Crossed adductors at the knees Plantars: Right: downgoing   Left: downgoing Cerebellar: normal finger-to-nose, Gait: Unable to assess secondary to weakness      Lab Results: Basic Metabolic Panel:  Recent Labs Lab 09/11/16 1200  NA 136  K 3.4*  CL 99*  CO2 25  GLUCOSE 90  BUN 6  CREATININE 0.66  CALCIUM 9.5    Liver Function Tests: No results for input(s): AST, ALT, ALKPHOS, BILITOT, PROT, ALBUMIN in the last 168 hours. No results for input(s): LIPASE, AMYLASE in the last 168 hours. No results for input(s): AMMONIA in the last 168 hours.  CBC:  Recent Labs Lab 09/11/16 1200  WBC 8.7  HGB 15.5*  HCT 44.4  MCV 95.5  PLT 216    Cardiac Enzymes: No results for input(s): CKTOTAL, CKMB, CKMBINDEX, TROPONINI in the last 168 hours.  Lipid Panel: No results for input(s): CHOL, TRIG, HDL, CHOLHDL, VLDL, LDLCALC in the last 168 hours.  CBG: No results for input(s): GLUCAP in the last 168 hours.  Microbiology: Results for orders placed or performed during the hospital encounter of 06/03/10  Bone marrow exam     Status: None   Collection Time: 06/03/10  9:35 AM  Result Value Ref Range Status   Bone Marrow Exam SEE PATHOLOGY REPORT [id number]  Final    Coagulation Studies: No results for input(s): LABPROT, INR in the last 72 hours.  Imaging: Ct Head Wo Contrast  Result Date: 09/11/2016 CLINICAL DATA:  Pain after fall. EXAM: CT HEAD WITHOUT CONTRAST TECHNIQUE: Contiguous axial images were obtained from the base of the skull through the vertex without intravenous contrast. COMPARISON:  Apr 25, 2016 FINDINGS: Brain: No subdural, epidural, or  subarachnoid hemorrhage. Ventricles and sulci are stable. Cerebellum, brainstem, and basal cisterns are normal. No acute cortical ischemia or infarct. Moderate white matter changes again identified. No mass, mass effect, or midline shift. Vascular: Atherosclerotic changes seen in the intracranial carotid arteries. Skull: Normal. Negative for fracture or focal lesion. Sinuses/Orbits: No acute finding. Other: No other abnormalities. IMPRESSION: No acute intracranial abnormality. Electronically Signed   By: Dorise Bullion III M.D   On: 09/11/2016 11:00       Assessment and plan per attending neurologist  Etta Quill PA-C [facility] [phone]  09/12/2016, 12:22 PM   Assessment/Plan: 82 year old female with progressive difficulty walking. She has multiple findings on exam consistent with upper motor neuron dysfunction, given the bilateral nature I think her cervical spine and thoracic spine need to be imaged. One concern that I do have with her noticing slurred speech is that this could represent motor neuron disease. If her cervical spine and thoracic spine imaging are negative, then I think that the next step would be EMG to look for other signs of lower motor neuron dysfunction.  1) MRI cervical and thoracic spine 2) B12 3) neurology will continue to follow  Roland Rack, MD [facility] [phone]  If 7pm- 7am, please page neurology on call as listed in [place].

## 2016-09-12 NOTE — ED Notes (Signed)
Per family pt returned to room with out glasses. Black with gold details. This RN called up to MRI to ask about glasses. Per technician Josh, they will search the room once test is completed. Family notified.

## 2016-09-12 NOTE — ED Notes (Addendum)
Pt reports dizziness when lying in bed. Patient attempted to ambulate with walker. States she just feels so weak. Patient was able to lift herself off the bed but then immediately sat back down stating "im just too dizzy and weak". EDP made aware; family at bedside.

## 2016-09-12 NOTE — ED Notes (Signed)
Pt back from MRI 

## 2016-09-12 NOTE — ED Notes (Signed)
Multiple attempts at capturing 12 lead ECG and orthostatic Vs. Delayed due to multiple MD's evaluating and family stating that her food will get cold. Will try again later.

## 2016-09-12 NOTE — ED Notes (Signed)
Pt up and face washed; eating breakfast; pleasant at this time. Denies needs or pain.

## 2016-09-12 NOTE — Discharge Planning (Signed)
EDCM and SW Crawford Givens) met with pt and family (son- Saralyn Pilar and daughter-Carole) to discuss safe discharge planning.  Lorriane Shire and I stressed our goal of the safe discharge of this patient.  Pt and family agree that if admission and SNF placement does not occur, the pt would return home with home health services.  Pt was set up with home health on yesterday with [facility].   EDCM provided a list of private duty agencies to [place] care.  [facility] will follow for further discharge planning.

## 2016-09-12 NOTE — ED Notes (Signed)
Pt still in MRI 

## 2016-09-13 ENCOUNTER — Observation Stay (HOSPITAL_COMMUNITY): Payer: Medicare Other

## 2016-09-13 DIAGNOSIS — I5032 Chronic diastolic (congestive) heart failure: Secondary | ICD-10-CM | POA: Diagnosis not present

## 2016-09-13 DIAGNOSIS — F1010 Alcohol abuse, uncomplicated: Secondary | ICD-10-CM | POA: Diagnosis not present

## 2016-09-13 DIAGNOSIS — I4891 Unspecified atrial fibrillation: Secondary | ICD-10-CM

## 2016-09-13 DIAGNOSIS — I2510 Atherosclerotic heart disease of native coronary artery without angina pectoris: Secondary | ICD-10-CM | POA: Diagnosis not present

## 2016-09-13 DIAGNOSIS — R001 Bradycardia, unspecified: Secondary | ICD-10-CM | POA: Diagnosis not present

## 2016-09-13 DIAGNOSIS — Z789 Other specified health status: Secondary | ICD-10-CM | POA: Diagnosis not present

## 2016-09-13 DIAGNOSIS — E876 Hypokalemia: Secondary | ICD-10-CM | POA: Diagnosis not present

## 2016-09-13 DIAGNOSIS — E43 Unspecified severe protein-calorie malnutrition: Secondary | ICD-10-CM | POA: Insufficient documentation

## 2016-09-13 DIAGNOSIS — I480 Paroxysmal atrial fibrillation: Secondary | ICD-10-CM | POA: Diagnosis not present

## 2016-09-13 LAB — CBC
HEMATOCRIT: 41.1 % (ref 36.0–46.0)
HEMOGLOBIN: 14 g/dL (ref 12.0–15.0)
MCH: 33.3 pg (ref 26.0–34.0)
MCHC: 34.1 g/dL (ref 30.0–36.0)
MCV: 97.6 fL (ref 78.0–100.0)
Platelets: 197 10*3/uL (ref 150–400)
RBC: 4.21 MIL/uL (ref 3.87–5.11)
RDW: 12.8 % (ref 11.5–15.5)
WBC: 7 10*3/uL (ref 4.0–10.5)

## 2016-09-13 LAB — COMPREHENSIVE METABOLIC PANEL
ALBUMIN: 3.3 g/dL — AB (ref 3.5–5.0)
ALT: 21 U/L (ref 14–54)
AST: 27 U/L (ref 15–41)
Alkaline Phosphatase: 59 U/L (ref 38–126)
Anion gap: 9 (ref 5–15)
CHLORIDE: 99 mmol/L — AB (ref 101–111)
CO2: 24 mmol/L (ref 22–32)
CREATININE: 0.51 mg/dL (ref 0.44–1.00)
Calcium: 9 mg/dL (ref 8.9–10.3)
GFR calc Af Amer: >60 mL/min (ref >60)
GFR calc non Af Amer: >60 mL/min (ref >60)
GLUCOSE: 104 mg/dL — AB (ref 65–99)
Potassium: 3.3 mmol/L — ABNORMAL LOW (ref 3.5–5.1)
SODIUM: 132 mmol/L — AB (ref 135–145)
Total Bilirubin: 1.4 mg/dL — ABNORMAL HIGH (ref 0.3–1.2)
Total Protein: 5.3 g/dL — ABNORMAL LOW (ref 6.5–8.1)

## 2016-09-13 LAB — MAGNESIUM: Magnesium: 1.5 mg/dL — ABNORMAL LOW (ref 1.7–2.4)

## 2016-09-13 LAB — RPR: RPR: NONREACTIVE

## 2016-09-13 MED ORDER — BOOST / RESOURCE BREEZE PO LIQD
1.0000 | Freq: Three times a day (TID) | ORAL | Status: DC
  Administered 2016-09-13 – 2016-09-17 (×8): 1 via ORAL

## 2016-09-13 MED ORDER — POTASSIUM CHLORIDE CRYS ER 20 MEQ PO TBCR
40.0000 meq | EXTENDED_RELEASE_TABLET | Freq: Once | ORAL | Status: AC
  Administered 2016-09-13: 40 meq via ORAL
  Filled 2016-09-13: qty 2

## 2016-09-13 MED ORDER — DILTIAZEM HCL ER COATED BEADS 120 MG PO CP24: 120.0000 mg | ORAL_CAPSULE | Freq: Every day | ORAL | Status: DC

## 2016-09-13 MED ORDER — LORAZEPAM 1 MG PO TABS
1.0000 mg | ORAL_TABLET | Freq: Four times a day (QID) | ORAL | Status: AC | PRN
  Administered 2016-09-14 – 2016-09-16 (×2): 1 mg via ORAL
  Filled 2016-09-13 (×2): qty 1

## 2016-09-13 MED ORDER — MAGNESIUM SULFATE 2 GM/50ML IV SOLN
2.0000 g | Freq: Once | INTRAVENOUS | Status: AC
Start: 2016-09-13 — End: 2016-09-13
  Administered 2016-09-13: 2 g via INTRAVENOUS
  Filled 2016-09-13: qty 50

## 2016-09-13 MED ORDER — LORAZEPAM 2 MG/ML IJ SOLN: 1.0000 mg | Freq: Four times a day (QID) | INTRAMUSCULAR | Status: AC | PRN

## 2016-09-13 MED ORDER — THIAMINE HCL 100 MG/ML IJ SOLN
100.0000 mg | Freq: Every day | INTRAMUSCULAR | Status: DC
  Administered 2016-09-13: 100 mg via INTRAVENOUS
  Filled 2016-09-13: qty 2

## 2016-09-13 MED ORDER — ENSURE ENLIVE PO LIQD: 237.0000 mL | Freq: Two times a day (BID) | ORAL | Status: DC

## 2016-09-13 MED ORDER — INFLUENZA VAC SPLIT QUAD 0.5 ML IM SUSY
0.5000 mL | PREFILLED_SYRINGE | INTRAMUSCULAR | Status: DC
  Filled 2016-09-13: qty 0.5

## 2016-09-13 MED ORDER — METOPROLOL TARTRATE 5 MG/5ML IV SOLN
5.0000 mg | Freq: Once | INTRAVENOUS | Status: AC
  Administered 2016-09-13: 5 mg via INTRAVENOUS
  Filled 2016-09-13: qty 5

## 2016-09-13 MED ORDER — VITAMIN B-1 100 MG PO TABS
100.0000 mg | ORAL_TABLET | Freq: Every day | ORAL | Status: DC
  Administered 2016-09-14 – 2016-09-17 (×4): 100 mg via ORAL
  Filled 2016-09-13 (×4): qty 1

## 2016-09-13 MED ORDER — BIOTENE DRY MOUTH MT LIQD: 15.0000 mL | OROMUCOSAL | Status: DC | PRN

## 2016-09-13 NOTE — Progress Notes (Signed)
Subjective: No change since yesterday--still has little appetite.   Exam: Vitals:   09/13/16 1150 09/13/16 1259  BP: 130/75 125/60  Pulse: 90 63  Resp:  17  Temp:  97.7 F (36.5 C)        Gen: In bed, NAD MS: alert and oriented.  CN: 2-12 intact with dysarthric speech.  Motor: Right :  Upper extremity   5/5                                      Left:     Upper extremity   5/5  LE strength is symmetric, 3/5 hip flexors. 4/5 knee and hip extension, 3/5 ankle dorsiflexion Slight fasciculation in bilateral quadricepts.   --Patient was able to stand with two-person assist, able to walk with walker and two person assist-Dropfoot on the right Sensory: intact    Pertinent Labs/Diagnostics: MRI C/T/L obtained with out etiology of weakness.  [id number]  Etta Quill PA-C [facility] [phone]  Impression: 82 year old female with progressive difficulty walking. She has multiple findings on exam consistent with upper motor neuron dysfunction, MRI's non-revealing. One concern that I do have with her noticing slurred speech is that this could represent motor neuron disease. Will obtain MRI brain and as out patient EMG     09/13/2016, 3:43 PM

## 2016-09-13 NOTE — Progress Notes (Signed)
Physical Therapy Treatment Patient Details Name: Lauren Maxwell MRN: [id number] DOB: [birth year] Today's Date: 09/13/2016    History of Present Illness Pt presented to the ER with fall 09/10/16. She states she was feeling weak and her right leg buckled, causing her to fall backwards, striking her had and bilateral elbows.  MRI 3 weeks ago with herniated disc but elected no surgery. Several falls the last few weeks. MRI 10/9 with new T3 compression fx, old T5,T6, L2, L5 fx PMHx: ETOH abuse, COPD, HTN, anxiety, AAA    PT Comments    Pt in bed on arrival stating need to urinate but unwilling to attempt use of BSC. Utilized bed pan then pt willing to attempt OOB. Pt with generalized weakness and assist needed for all transfers and standing with deferral of ambulation trial today. Pt able to stand with RLE blocked, cues and assist. Pt encouraged to increase mobility with nursing assist and be OOB to [facility] throughout the day as well as chair and continue HEP. Will continue to follow with SNF most appropriate at this time.   HR 91  Follow Up Recommendations  SNF;Supervision/Assistance - 24 hour     Equipment Recommendations  None recommended by PT    Recommendations for Other Services       Precautions / Restrictions Precautions Precautions: Fall Precaution Comments: right drop foot, compression fx T3    Mobility  Bed Mobility Overal bed mobility: Needs Assistance Bed Mobility: Rolling;Sidelying to Sit Rolling: Min guard Sidelying to sit: Min assist       General bed mobility comments: assist to bring legs off of bed and elevate trunk   Transfers Overall transfer level: Needs assistance   Transfers: Sit to/from [employer] Transfers Sit to Stand: Mod assist   Squat pivot transfers: Max assist     General transfer comment: pt with max cues for sequence, hand placement and anterior translation with assist to rise from surface x 3 trials. In standing pt maintaining  flexed posture despite multimodal cues and facilitation at pelvis and trunk. pt not following commands for stepping and transitioned to squat pivot on 3rd attempt with pt able to reach for chair with max assist to rotate and control pelvis to pivot  Ambulation/Gait                 Stairs            Wheelchair Mobility    Modified Rankin (Stroke Patients Only)       Balance Overall balance assessment: Needs assistance   Sitting balance-Leahy Scale: Fair       Standing balance-Leahy Scale: Poor                      Cognition Arousal/Alertness: Awake/alert Behavior During Therapy: Flat affect Overall Cognitive Status: Within Functional Limits for tasks assessed                      Exercises General Exercises - Lower Extremity Long Arc Quad: AROM;Both;10 reps;Seated Hip ABduction/ADduction: AAROM;Both;10 reps;Seated Hip Flexion/Marching: AAROM;Both;10 reps;Seated    General Comments        Pertinent Vitals/Pain Pain Assessment: 0-10 Pain Score: 5  Pain Location: back pain Pain Descriptors / Indicators: Aching Pain Intervention(s): Limited activity within patient's tolerance;Monitored during session;Repositioned    Home Living                      Prior Function  PT Goals (current goals can now be found in the care plan section) Progress towards PT goals: Progressing toward goals    Frequency           PT Plan Current plan remains appropriate    Co-evaluation             End of Session Equipment Utilized During Treatment: Gait belt Activity Tolerance: Patient limited by fatigue Patient left: in chair;with call bell/phone within reach;with family/visitor present;with chair alarm set;with nursing/sitter in room     Time: 1136-1206 PT Time Calculation (min) (ACUTE ONLY): 30 min  Charges:  $Therapeutic Exercise: 8-22 mins $Therapeutic Activity: 8-22 mins                    G Codes:       Melford Aase 09/13/2016, 1:29 PM Elwyn Reach, [place]

## 2016-09-13 NOTE — Progress Notes (Addendum)
PROGRESS NOTE    Lauren Maxwell  [id number] DOB: [birth year] DOA: 09/11/2016 PCP: Mayra Neer, MD   Outpatient Specialists:     Brief Narrative:  Lauren Maxwell is a 82 y.o. female with a Past Medical History of depression, anxiety, AAA, follicular lymphoma, HTN, smoking who presents with symptomatically bradycardia and hypotension in the setting of mild metabolic derangements with failure to thrive in the setting of continued chronic alcohol use and abuse. Neuro and cardiology following.   Assessment & Plan:   Principal Problem:   Bradycardia Active Problems:   Smoking   Chronic diastolic heart failure (HCC)   Coronary artery disease involving native coronary artery of native heart without angina pectoris   Anticoagulation therapy not indicated   Generalized weakness   Hypotension   Hypokalemia   ETOH abuse   Hyponatremia   Protein-calorie malnutrition, severe   Tachy/Brady syndrome? -cards/EP following -Hold diltiazem -TSH ok   Hypotension. -Gentle IV fluids -resolved  Hypokalemia./hypomagnesium -replete to 4 for K and > 2 for Mg  Chronic diastolic heart failure. Appears compensated. Home medications include lisinopril. Echo done last year reveals a mild LVH with an EF of 60% and grade 1 diastolic dysfunction -Daily weights -Monitor urine output   Hyponatremia. Likely related to decreased oral intake in the setting of EtOH use Recheck in AM  EtOH use. Patient reports 1-2 drinks a day. No signs symptoms of withdrawals on admission -CIWA protocol -Monitor  CAD. She denies chest pain. EKG without acute changes. -Monitor. -See #1  Tobacco use. -Cessation counseling offered  Generalized weakness/failure to thrive/frequent falls. In the setting of chronic anxiety/depression and ETOH use. No sign of infectious process. Mild metabolic derangements. -MRI c-spine, lumbar spine and thoracic spine- lots of issues-- defer to neuro-- will get MRI  brain and may need outpatient EMG -B1 pending-- thiamine added -PT consult    DVT prophylaxis:  Fully anticoagulated   Code Status: Full Code   Family Communication: Patient and daughter  Disposition Plan:     Consultants:   Cards  EP    Subjective: Feels weak all over  Objective: Vitals:   09/13/16 0300 09/13/16 0402 09/13/16 1150 09/13/16 1259  BP:  (!) 164/90 130/75 125/60  Pulse:  87 90 63  Resp:  20  17  Temp: 97.9 F (36.6 C) 97.9 F (36.6 C)  97.7 F (36.5 C)  TempSrc: Oral Oral  Oral  SpO2:  94%  95%  Weight:      Height:        Intake/Output Summary (Last 24 hours) at 09/13/16 1507 Last data filed at 09/13/16 1300  Gross per 24 hour  Intake           635.83 ml  Output              301 ml  Net           334.83 ml   Filed Weights   09/11/16 0955  Weight: 46.3 kg (102 lb)    Examination:  General exam: Appears calm and comfortable  Respiratory system: Clear to auscultation. Respiratory effort normal. Cardiovascular system: irr, No pedal edema. Gastrointestinal system: Abdomen is nondistended, soft and nontender. No organomegaly or masses felt. Normal bowel sounds heard. Central nervous system: Alert and oriented. No focal neurological deficits. Extremities: weak      Data Reviewed: I have personally reviewed following labs and imaging studies  CBC:  Recent Labs Lab 09/11/16 1200 09/13/16 0410  WBC 8.7 7.0  HGB  15.5* 14.0  HCT 44.4 41.1  MCV 95.5 97.6  PLT 216 [id number]   Basic Metabolic Panel:  Recent Labs Lab 09/11/16 1200 09/12/16 1328 09/13/16 0410  NA 136 132* 132*  K 3.4* 3.0* 3.3*  CL 99* 97* 99*  CO2 25 24 24   GLUCOSE 90 121* 104*  BUN 6 6 <5*  CREATININE 0.66 0.61 0.51  CALCIUM 9.5 9.3 9.0  MG  --   --  1.5*   GFR: Estimated Creatinine Clearance: 39.6 mL/min (by C-G formula based on SCr of 0.51 mg/dL). Liver Function Tests:  Recent Labs Lab 09/12/16 1328 09/13/16 0410  AST 33 27  ALT 22 21    ALKPHOS 63 59  BILITOT 1.9* 1.4*  PROT 5.7* 5.3*  ALBUMIN 3.8 3.3*   No results for input(s): LIPASE, AMYLASE in the last 168 hours. No results for input(s): AMMONIA in the last 168 hours. Coagulation Profile: No results for input(s): INR, PROTIME in the last 168 hours. Cardiac Enzymes: No results for input(s): CKTOTAL, CKMB, CKMBINDEX, TROPONINI in the last 168 hours. BNP (last 3 results) No results for input(s): PROBNP in the last 8760 hours. HbA1C: No results for input(s): HGBA1C in the last 72 hours. CBG: No results for input(s): GLUCAP in the last 168 hours. Lipid Profile: No results for input(s): CHOL, HDL, LDLCALC, TRIG, CHOLHDL, LDLDIRECT in the last 72 hours. Thyroid Function Tests:  Recent Labs  09/12/16 1328  TSH 2.227   Anemia Panel:  Recent Labs  09/12/16 1328  VITAMINB12 488  FOLATE 21.0   Urine analysis:    Component Value Date/Time   COLORURINE YELLOW 09/11/2016 [place] 09/11/2016 1117   LABSPEC <1.005 (L) 09/11/2016 1117   PHURINE 6.0 09/11/2016 1117   GLUCOSEU NEGATIVE 09/11/2016 1117   HGBUR SMALL (A) 09/11/2016 1117   BILIRUBINUR NEGATIVE 09/11/2016 1117   KETONESUR 15 (A) 09/11/2016 1117   PROTEINUR NEGATIVE 09/11/2016 1117   NITRITE NEGATIVE 09/11/2016 1117   LEUKOCYTESUR TRACE (A) 09/11/2016 1117     )No results found for this or any previous visit (from the past 240 hour(s)).    Anti-infectives    None       Radiology Studies: Mr Cervical Spine Wo Contrast  Result Date: 09/12/2016 CLINICAL DATA:  Initial evaluation for generalized weakness with tear earlier disc thrive, frequent falls. EXAM: MRI CERVICAL SPINE WITHOUT CONTRAST TECHNIQUE: Multiplanar, multisequence MR imaging of the cervical spine was performed. No intravenous contrast was administered. COMPARISON:  None available. FINDINGS: Alignment: Vertebral bodies normally aligned with preservation of the normal cervical lordosis. No listhesis or  subluxation. Vertebrae: Vertebral body heights preserved. No evidence for acute or chronic fracture. Signal intensity within the vertebral body bone marrow is normal. No worrisome osseous lesions. No marrow edema. Cord: Signal intensity within the cervical spinal cord is normal. Posterior Fossa, vertebral arteries, paraspinal tissues: Visualized portions of the brain and posterior fossa are unremarkable. Craniocervical junction within normal limits. Paraspinous and prevertebral soft tissues within normal limits. Normal intravascular flow voids present within the vertebral arteries bilaterally. Disc levels: C2-C3: Mild bilateral uncovertebral hypertrophy without significant stenosis. C3-C4: Mild diffuse disc bulge with disc desiccation and intervertebral disc space narrowing. Bulging disc flattens the ventral thecal sac and results in mild canal stenosis. Superimposed left-sided uncovertebral hypertrophy with resultant mild left foraminal narrowing. C4-C5: Diffuse degenerative disc bulge with intervertebral disc space narrowing and disc desiccation. Bilateral uncovertebral hypertrophy. Posterior disc bulge flattens and partially faces the ventral thecal sac results in mild  canal stenosis. Resultant moderate to severe bilateral foraminal stenosis, slightly worse on the left. C5-C6: Mild diffuse degenerative disc bulge with disc desiccation and intervertebral disc space narrowing. Left greater than right uncovertebral hypertrophy. Resultant severe bilateral foraminal stenosis, left greater than right. Bulging disc flattens the ventral thecal sac and results in mild canal stenosis. C6-C7: Diffuse degenerative disc bulge with disc desiccation and intervertebral disc space narrowing. Left greater than right uncovertebral hypertrophy. Moderate left with mild right foraminal stenosis. Superimposed ligamentum flavum hypertrophy at this level. Resultant moderate canal stenosis. C7-T1: Mild diffuse disc bulge with  uncovertebral hypertrophy. Mild left foraminal narrowing. No significant right foraminal stenosis. Central canal widely patent. Partially visualized upper thoracic spine unremarkable without significant stenosis or degenerative changes. IMPRESSION: 1. No evidence for acute traumatic injury within the cervical spine. 2. Moderate multilevel degenerative spondylolysis with mild to moderate diffuse canal narrowing extending from C3-4 through C6-7, greatest at the C6-7 level. 3. Severe bilateral foraminal stenosis due to disc bulge and uncovertebral disease at C4-5 and C5-6, with moderate left foraminal narrowing at C6-7. Electronically Signed   By: Jeannine Boga M.D.   On: 09/12/2016 16:15   Mr Thoracic Spine Wo Contrast  Result Date: 09/12/2016 CLINICAL DATA:  Persistent mid and low back pain. Multiple recent falls. EXAM: MRI THORACIC SPINE WITHOUT CONTRAST TECHNIQUE: Multiplanar, multisequence MR imaging of the thoracic spine was performed. No intravenous contrast was administered. COMPARISON:  Radiographs dated 07/07/2016 and lumbar MRI dated 06/25/2016 and chest CT dated 05/15/2013 FINDINGS: Alignment: There is accentuation of the thoracic kyphosis. No subluxation. Vertebrae: There is a subacute subtle benign-appearing compression fracture of the T3 vertebral body with slight residual edema in the vertebra. No protrusion of bone or disc material into the spinal canal. No bone destruction. There are old slight anterior wedge deformities of T5 and T6, unchanged since the prior CT scan. Degenerative changes of the vertebral endplates at [id number], chronic. Cord:  Normal.  Conus tip at T12-L1. Paraspinal and other soft tissues: There is a small left pleural effusion and a tiny right effusion. There is aneurysmal dilatation of the arch of the aorta to a diameter of 4.1 cm at the top of the ascending aorta. Aortic atherosclerosis. Disc levels: The discs throughout the thoracic spine demonstrate no protrusion or  bulging. No foraminal or spinal stenosis. IMPRESSION: 1. Subacute mild benign-appearing compression fracture of the T3 vertebral body. No neural impingement. 2. Old compression deformities of T5 and T6, stable. 3. Small left pleural effusion. 4. Slight aneurysmal dilatation of the top of the ascending thoracic aorta to a diameter of 4.1 cm. 5. Aortic atherosclerosis. Electronically Signed   By: Lorriane Shire M.D.   On: 09/12/2016 16:43   Mr Lumbar Spine Wo Contrast  Result Date: 09/12/2016 CLINICAL DATA:  History of falls.  Failure to thrive. EXAM: MRI LUMBAR SPINE WITHOUT CONTRAST TECHNIQUE: Multiplanar, multisequence MR imaging of the lumbar spine was performed. No intravenous contrast was administered. COMPARISON:  06/25/2016; 07/07/2016 FINDINGS: Despite efforts by the technologist and patient, severe motion artifact is present on today's exam and could not be eliminated. This reduces exam sensitivity and specificity. Segmentation: The lowest lumbar type non-rib-bearing vertebra is labeled as L5. Alignment: 8 mm anterolisthesis at L5-S1 associated with bilateral L5 pars defects. Vertebrae: Type 1 degenerative endplate findings eccentric to the right at L5-S1, fairly similar to the prior exam. Remote inferior endplate compressions at L5 and L2, no change from prior. There is some subtle lower sacral or sacrococcygeal edema and  low-level presacral edema does not visibly present previously, then the possibility of a subtle fracture in the sacrococcygeal region is raised, although given the degree of motion artifact, I am not entirely certain. Conus medullaris: Extends to the T12-L1 level and appears normal. Paraspinal and other soft tissues: On inversion recovery weighted images there is some equivocal edema in the paraspinal musculature of the lumbar spine. Disc levels: L1-2:  Unremarkable. L2-3:  No definite impingement. L3-4:  No definite impingement. L4-5: Stable mild left foraminal stenosis due to disc  uncovering and left facet arthropathy. L5-S1: Stable mild right and borderline left foraminal stenosis due to disc uncovering and facet arthropathy. IMPRESSION: 1. Subtle edema in the sacrococcygeal region could reflect a nondisplaced fracture. Motion artifact makes this finding not entirely certain. 2. Low-level paraspinal muscular edema, possible muscular strain. 3. No new compression fracture in the lumbar spine is identified. Old inferior endplate compressions at L5 and L2. 4. Stable mild impingement at L4-5 and L5-S1. 5. Stable appearance of bilateral pars defects at L5 with 8 mm anterolisthesis of L5 on S1. Electronically Signed   By: Van Clines M.D.   On: 09/12/2016 16:55        Scheduled Meds: . apixaban  2.5 mg Oral BID  . feeding supplement (ENSURE ENLIVE)  237 mL Oral BID BM  . folic acid  1 mg Oral Daily  . [START ON 09/14/2016] Influenza vac split quadrivalent PF  0.5 mL Intramuscular Tomorrow-1000  . magnesium sulfate 1 - 4 g bolus IVPB  2 g Intravenous Once  . multivitamin with minerals  1 tablet Oral Daily  . sodium chloride flush  3 mL Intravenous Q12H  . thiamine  100 mg Oral Daily   Or  . thiamine  100 mg Intravenous Daily   Continuous Infusions:    LOS: 0 days    Time spent: 35 min    Coin, DO [facility] Pager [phone]  If 7PM-7AM, please contact night-coverage [email or web address] Password [email or web address] 09/13/2016, 3:07 PM

## 2016-09-13 NOTE — Care Management Obs Status (Signed)
[place] NOTIFICATION   Patient Details  Name: Lauren Maxwell MRN: [id number] Date of Birth: July 01, 1931   Medicare Observation Status Notification Given:  Yes    Dawayne Patricia, RN 09/13/2016, 5:11 PM

## 2016-09-13 NOTE — Consult Note (Signed)
ELECTROPHYSIOLOGY CONSULT NOTE    Patient ID: Lauren Maxwell MRN: [id number], DOB/AGE: 80-Mar-1932 80 y.o.  Admit date: 09/11/2016 Date of Consult: 09/13/2016   Primary Physician: Mayra Neer, MD Primary Cardiologist: Dr. Tamala Julian Requesting MD: Dr. Radford Pax  Reason for Consultation: evaluate possible tachybrady syndrome  HPI: Lauren Maxwell is a 82 y.o. female with PMHx of PAFib, COPD, chronic CHF (disastolic), CAD noted on CT in 2014, dilated ascending aorta, HTN, alcohol abuse (reports between 3-5 oz of liquor per day), and tobacco abuse, still smoking,  who presented to [facility] ED on 09/11/2016 for evaluation of dizziness and a recent fall.   While in the ED, she was noted to be bradycardiac reportedly in the 40's-70's.  Her home cardizem (120mg  daily) was held (yesterday) with variable BPs as well with lows SBP 90's, her lisinopril held as well.  Her HR was observed in the ER to improved 70's, and remained generally 70's-90's in sinus until today with RAFib 130's, she received 5mg  IV lopressor.  She has subsequently returned to [place] 70's.  EP is asked to evaluate for possible tachy-brady syndrome.    The patient has had progressive weakness, and gait difficulty in the last 3 months, in the last 2 months requiring a walker to ambulate and reports falls every couple weeks at least, the falls are felt to be secondary to weakness in her lags, particularly RLE and her son states her PMD r/o stroke with CT and she has been found with back problems, the patient opted not to pursue surgery, though has not quite been able to get to PT yet and was not able to tolerate the back braces making feel like she cannot breath well.  She does say she feels lightheaded or dizzy at times, particularly upon standing, though does not feel like these are always associated with her falls.  She states the day of admission she had gone into the kitchen, felt weak and felt straight down, like her legs gave way then  back and hit her head, she did not have LOC but was unable and to weak to get back up, states she shuffelled to the den grabbed pillow off the sofa and laid on the floor until she felt like she could get up.  She had no CP but did feel her heart racing.  She suspects her cardizem for the dizziness.  Admit labs notable for K+ 3.0 and hyponatremia 136 > 132 LABS; K+ 3.4 . 3.0 . 3.3 (replacement continues) BUN/Creat 6/0.66 Na+ 136 > 132 > 132 Mag 1.5 (replaced today) TSH 2.227 H/H 15/44 WBC 8.7 plts 216  Past Medical History:  Diagnosis Date  . Anxiety and depression   . Ascending aortic aneurysm ([place])   . Follicular lymphoma ([place])   . Hypertension   . Smoking      Surgical History:  Past Surgical History:  Procedure Laterality Date  . colonoscopy    . thyroid nodule excision    . TONSILLECTOMY    . TUBAL LIGATION       Prescriptions Prior to Admission  Medication Sig Dispense Refill Last Dose  . CARTIA XT 120 MG 24 hr capsule take 1 capsule by mouth once daily 30 capsule 0 09/10/2016 at Unknown time  . ELIQUIS 2.5 MG TABS tablet take 1 tablet by mouth twice a day 60 tablet 6 09/10/2016 at Unknown time  . lisinopril (PRINIVIL,ZESTRIL) 20 MG tablet Take 20 mg by mouth 2 (two) times daily.    09/10/2016  at Unknown time  . Multiple Vitamin (MULTIVITAMIN) tablet Take 1 tablet by mouth every other day. Centrum Silver    Past Week at Unknown time  . sodium chloride (OCEAN) 0.65 % nasal spray Place 1 spray into the nose as needed. For nasal congestion.   Past Week at Unknown time    Inpatient Medications:  . apixaban  2.5 mg Oral BID  . feeding supplement  1 Container Oral TID BM  . folic acid  1 mg Oral Daily  . [START ON 09/14/2016] Influenza vac split quadrivalent PF  0.5 mL Intramuscular Tomorrow-1000  . magnesium sulfate 1 - 4 g bolus IVPB  2 g Intravenous Once  . multivitamin with minerals  1 tablet Oral Daily  . sodium chloride flush  3 mL Intravenous Q12H  . thiamine  100  mg Oral Daily   Or  . thiamine  100 mg Intravenous Daily    Allergies:  Allergies  Allergen Reactions  . Codeine Nausea And Vomiting    Social History   Social History  . Marital status: Divorced    Spouse name: N/A  . Number of children: N/A  . Years of education: N/A   Occupational History  . Not on file.   Social History Main Topics  . Smoking status: Current Every Day Smoker    Packs/day: 0.50    Years: 62.00  . Smokeless tobacco: Never Used  . Alcohol use 25.2 oz/week    42 Shots of liquor per week     Comment: 5-6 oz vodka yesterday afternoon  . Drug use: No  . Sexual activity: Not on file     Comment: MPOA son Malaina Lescano   Other Topics Concern  . Not on file   Social History Narrative  . No narrative on file     Family History  Problem Relation Age of Onset  . Cancer Mother 72    colon cancer  . Cancer Maternal Aunt     multiple aunts have colon cancer  . Lung cancer Brother   . Other Brother     post tramactic stress disorder     Review of Systems: All other systems reviewed and are otherwise negative except as noted above.  Physical Exam: Vitals:   09/13/16 0300 09/13/16 0402 09/13/16 1150 09/13/16 1259  BP:  (!) 164/90 130/75 125/60  Pulse:  87 90 63  Resp:  20  17  Temp: 97.9 F (36.6 C) 97.9 F (36.6 C)  97.7 F (36.5 C)  TempSrc: Oral Oral  Oral  SpO2:  94%  95%  Weight:      Height:        GEN- The patient frail, elderly appearing, alert and oriented x 3 today.   HEENT: normocephalic, atraumatic; sclera clear, conjunctiva pink; hearing intact; oropharynx clear; neck supple, no JVP Lymph- no cervical lymphadenopathy Lungs- Clear to ausculation bilaterally, normal work of breathing.  No wheezes, rales, rhonchi Heart- Regular rate and rhythm, 1/6 SM, rubs or gallops, PMI not laterally displaced GI- soft, non-tender, non-distended Extremities- no clubbing, cyanosis, or edema MS- age appropriate atrophy Skin- warm and dry, no  rash or lesion Psych- euthymic mood, full affect Neuro- no gross deficits observed  Labs:   Lab Results  Component Value Date   WBC 7.0 09/13/2016   HGB 14.0 09/13/2016   HCT 41.1 09/13/2016   MCV 97.6 09/13/2016   PLT 197 09/13/2016    Recent Labs Lab 09/13/16 0410  NA 132*  K 3.3*  CL 99*  CO2 24  BUN <5*  CREATININE 0.51  CALCIUM 9.0  PROT 5.3*  BILITOT 1.4*  ALKPHOS 59  ALT 21  AST 27  GLUCOSE 104*      Radiology/Studies:  Ct Head Wo Contrast Result Date: 09/11/2016 CLINICAL DATA:  Pain after fall. EXAM: CT HEAD WITHOUT CONTRAST TECHNIQUE: Contiguous axial images were obtained from the base of the skull through the vertex without intravenous contrast. COMPARISON:  Apr 25, 2016 FINDINGS: Brain: No subdural, epidural, or subarachnoid hemorrhage. Ventricles and sulci are stable. Cerebellum, brainstem, and basal cisterns are normal. No acute cortical ischemia or infarct. Moderate white matter changes again identified. No mass, mass effect, or midline shift. Vascular: Atherosclerotic changes seen in the intracranial carotid arteries. Skull: Normal. Negative for fracture or focal lesion. Sinuses/Orbits: No acute finding. Other: No other abnormalities. IMPRESSION: No acute intracranial abnormality. Electronically Signed   By: Dorise Bullion III M.D   On: 09/11/2016 11:00   Mr Cervical Spine Wo Contrast Result Date: 09/12/2016 CLINICAL DATA:  Initial evaluation for generalized weakness with tear earlier disc thrive, frequent falls. EXAM: MRI CERVICAL SPINE WITHOUT CONTRAST TECHNIQUE: Multiplanar, multisequence MR imaging of the cervical spine was performed. No intravenous contrast was administered. COMPARISON:  None available. FINDINGS: Alignment: Vertebral bodies normally aligned with preservation of the normal cervical lordosis. No listhesis or subluxation. Vertebrae: Vertebral body heights preserved. No evidence for acute or chronic fracture. Signal intensity within the  vertebral body bone marrow is normal. No worrisome osseous lesions. No marrow edema. Cord: Signal intensity within the cervical spinal cord is normal. Posterior Fossa, vertebral arteries, paraspinal tissues: Visualized portions of the brain and posterior fossa are unremarkable. Craniocervical junction within normal limits. Paraspinous and prevertebral soft tissues within normal limits. Normal intravascular flow voids present within the vertebral arteries bilaterally. Disc levels: C2-C3: Mild bilateral uncovertebral hypertrophy without significant stenosis. C3-C4: Mild diffuse disc bulge with disc desiccation and intervertebral disc space narrowing. Bulging disc flattens the ventral thecal sac and results in mild canal stenosis. Superimposed left-sided uncovertebral hypertrophy with resultant mild left foraminal narrowing. C4-C5: Diffuse degenerative disc bulge with intervertebral disc space narrowing and disc desiccation. Bilateral uncovertebral hypertrophy. Posterior disc bulge flattens and partially faces the ventral thecal sac results in mild canal stenosis. Resultant moderate to severe bilateral foraminal stenosis, slightly worse on the left. C5-C6: Mild diffuse degenerative disc bulge with disc desiccation and intervertebral disc space narrowing. Left greater than right uncovertebral hypertrophy. Resultant severe bilateral foraminal stenosis, left greater than right. Bulging disc flattens the ventral thecal sac and results in mild canal stenosis. C6-C7: Diffuse degenerative disc bulge with disc desiccation and intervertebral disc space narrowing. Left greater than right uncovertebral hypertrophy. Moderate left with mild right foraminal stenosis. Superimposed ligamentum flavum hypertrophy at this level. Resultant moderate canal stenosis. C7-T1: Mild diffuse disc bulge with uncovertebral hypertrophy. Mild left foraminal narrowing. No significant right foraminal stenosis. Central canal widely patent. Partially  visualized upper thoracic spine unremarkable without significant stenosis or degenerative changes. IMPRESSION: 1. No evidence for acute traumatic injury within the cervical spine. 2. Moderate multilevel degenerative spondylolysis with mild to moderate diffuse canal narrowing extending from C3-4 through C6-7, greatest at the C6-7 level. 3. Severe bilateral foraminal stenosis due to disc bulge and uncovertebral disease at C4-5 and C5-6, with moderate left foraminal narrowing at C6-7. Electronically Signed   By: Jeannine Boga M.D.   On: 09/12/2016 16:15   Mr Thoracic Spine Wo Contrast Result Date: 09/12/2016 CLINICAL DATA:  Persistent mid  and low back pain. Multiple recent falls. EXAM: MRI THORACIC SPINE WITHOUT CONTRAST TECHNIQUE: Multiplanar, multisequence MR imaging of the thoracic spine was performed. No intravenous contrast was administered. COMPARISON:  Radiographs dated 07/07/2016 and lumbar MRI dated 06/25/2016 and chest CT dated 05/15/2013 FINDINGS: Alignment: There is accentuation of the thoracic kyphosis. No subluxation. Vertebrae: There is a subacute subtle benign-appearing compression fracture of the T3 vertebral body with slight residual edema in the vertebra. No protrusion of bone or disc material into the spinal canal. No bone destruction. There are old slight anterior wedge deformities of T5 and T6, unchanged since the prior CT scan. Degenerative changes of the vertebral endplates at [id number], chronic. Cord:  Normal.  Conus tip at T12-L1. Paraspinal and other soft tissues: There is a small left pleural effusion and a tiny right effusion. There is aneurysmal dilatation of the arch of the aorta to a diameter of 4.1 cm at the top of the ascending aorta. Aortic atherosclerosis. Disc levels: The discs throughout the thoracic spine demonstrate no protrusion or bulging. No foraminal or spinal stenosis. IMPRESSION: 1. Subacute mild benign-appearing compression fracture of the T3 vertebral body. No neural  impingement. 2. Old compression deformities of T5 and T6, stable. 3. Small left pleural effusion. 4. Slight aneurysmal dilatation of the top of the ascending thoracic aorta to a diameter of 4.1 cm. 5. Aortic atherosclerosis. Electronically Signed   By: Lorriane Shire M.D.   On: 09/12/2016 16:43     EKG:  SR, 85bpm, PACs, PVCs SR, 67bpm TELEMETRY: SR 70's-90's, PAFib RVR 130-140's, currently in SR 70's  06/02/15: TTE Study Conclusions - Left ventricle: The cavity size was normal. Wall thickness was   increased in a pattern of mild LVH. There was moderate focal   basal hypertrophy of the septum. Systolic function was normal.   The estimated ejection fraction was in the range of 60% to 65%.   Wall motion was normal; there were no regional wall motion   abnormalities. Doppler parameters are consistent with abnormal   left ventricular relaxation (grade 1 diastolic dysfunction). The   E/e&' ratio is <8, suggesting normal LV filling pressure. - Mitral valve: Calcified annulus. - Left atrium: The atrium was normal in size. - Right atrium: The atrium was normal in size. - Tricuspid valve: There was trivial regurgitation. - Pulmonary arteries: PA peak pressure: 15 mm Hg (S). - Inferior vena cava: The vessel was normal in size. The   respirophasic diameter changes were in the normal range (>= 50%),   consistent with normal central venous pressure. Impressions: - LVEF 60-65%, mild LVH, moderate basal septal hypertrophy, normal   wall motion, diastolic dysfunction, normal LV filling pressure,   normal biatrial sizes, normal RVSP.   Assessment and Plan:   1. Fall, observed SB 2. PAFib     CHA2DS2Vasc is at least 4, on Eliquis (2.5mg  for age/weight) She has had a number of fall, though no overt injuries reported.  This will need to be monitored, Thoraci spine compression fx and C-spine disease/canal narowing     Reportedly SB 40's on arrival, no real bradycardia documented     RAFib this  morning   Unable to find evidence of bradycardia, not clear that weakness/falls symptoms secondary are to HR, and/or her back problems electrolyte imbalance adds to picture.  She was and remains hypokalemic and low mag as well both being replaced BP was also noted 90's is improved  update her echo doppler, stop daily diltiazem and use Diltiazem 30mg   PO BID PRN for palpitations.   3. HTN with relative hypotension     Appears stable currently off meds     Signed, Tommye Standard, PA-C 09/13/2016 4:05 PM   I have seen, examined the patient, and reviewed the above assessment and plan.  Changes to above are made where necessary.  She is elderly and frail on exam.  I do not feel that bradycardia is her primary issue.  I do not feel presently that she would benefit from any EP procedures.  Recs: Stop long acting diltiazem Start short activing diltiazem 30mg  q6h prn for tachy palpitations Continue low dose eliquis  Follow-up in the AF clinic 3-4 days post discharge  Electrophysiology team to see as needed while here. Please call with questions.   Co Sign: Thompson Grayer, MD 09/13/2016 9:40 PM

## 2016-09-13 NOTE — Progress Notes (Signed)
Initial Nutrition Assessment  DOCUMENTATION CODES:   Severe malnutrition in context of chronic illness, Underweight  INTERVENTION:    Boost Breeze po TID, each supplement provides 250 kcal and 9 grams of protein  NUTRITION DIAGNOSIS:   Malnutrition related to chronic illness as evidenced by severe depletion of body fat, severe depletion of muscle mass  GOAL:   Patient will meet greater than or equal to 90% of their needs  MONITOR:   PO intake, Supplement acceptance, Labs, Weight trends, I & O's  REASON FOR ASSESSMENT:   Consult Assessment of nutrition requirement/status  ASSESSMENT:   82 y.o. Female with PMH of depression, anxiety, AAA, follicular lymphoma, HTN, smoking who presents with symptomatically bradycardia and hypotension in the setting of mild metabolic derangements with failure to thrive in the setting of continued chronic alcohol use and abuse. Neuro and cardiology following.  Spoke with pt and pt's son at bedside. Family provided lunch today from restaurant.  Pt reports she's lost about 6 lbs since August 2017. Has Ensure Enlive ordered, however, pt doesn't like. Amenable to trying Boost Breeze >> will order.  Nutrition-Focused physical exam completed. Findings are severe fat depletion, severe muscle depletion, and no edema.  Diet Order:  Diet Heart Room service appropriate? Yes; Fluid consistency: Thin  Skin:  Reviewed, no issues  Last BM:  10/9  Height:   Ht Readings from Last 1 Encounters:  09/11/16 5\' 5"  (1.651 m)    Weight:   Wt Readings from Last 1 Encounters:  09/11/16 102 lb (46.3 kg)    Ideal Body Weight:  56.8 kg  BMI:  Body mass index is 16.97 kg/m.  Estimated Nutritional Needs:   Kcal:  1300-1500  Protein:  65-75 gm  Fluid:  >/= 1.5 L  EDUCATION NEEDS:   No education needs identified at this time  Arthur Holms, RD, LDN Pager #: [phone] After-Hours Pager #: [phone]

## 2016-09-13 NOTE — Progress Notes (Signed)
SUBJECTIVE:  Went into afib with RVR this am  OBJECTIVE:   Vitals:   Vitals:   09/13/16 0300 09/13/16 0402 09/13/16 1150 09/13/16 1259  BP:  (!) 164/90 130/75 125/60  Pulse:  87 90 63  Resp:  20  17  Temp: 97.9 F (36.6 C) 97.9 F (36.6 C)  97.7 F (36.5 C)  TempSrc: Oral Oral  Oral  SpO2:  94%  95%  Weight:      Height:       I&O's:   Intake/Output Summary (Last 24 hours) at 09/13/16 1342 Last data filed at 09/13/16 1259  Gross per 24 hour  Intake          1135.83 ml  Output              200 ml  Net           935.83 ml   TELEMETRY: Reviewed telemetry pt in atrial fibrillation:     PHYSICAL EXAM General: Well developed, well nourished, in no acute distress Head: Eyes PERRLA, No xanthomas.   Normal cephalic and atramatic  Lungs:   Clear bilaterally to auscultation and percussion. Heart:   Irregularly irregular S1 S2 Pulses are 2+ & equal. Abdomen: Bowel sounds are positive, abdomen soft and non-tender without masses Msk:  Back normal, normal gait. Normal strength and tone for age. Extremities:   No clubbing, cyanosis or edema.  DP +1 Neuro: Alert and oriented X 3. Psych:  Good affect, responds appropriately   LABS: Basic Metabolic Panel:  Recent Labs  09/12/16 1328 09/13/16 0410  NA 132* 132*  K 3.0* 3.3*  CL 97* 99*  CO2 24 24  GLUCOSE 121* 104*  BUN 6 <5*  CREATININE 0.61 0.51  CALCIUM 9.3 9.0  MG  --  1.5*   Liver Function Tests:  Recent Labs  09/12/16 1328 09/13/16 0410  AST 33 27  ALT 22 21  ALKPHOS 63 59  BILITOT 1.9* 1.4*  PROT 5.7* 5.3*  ALBUMIN 3.8 3.3*   No results for input(s): LIPASE, AMYLASE in the last 72 hours. CBC:  Recent Labs  09/11/16 1200 09/13/16 0410  WBC 8.7 7.0  HGB 15.5* 14.0  HCT 44.4 41.1  MCV 95.5 97.6  PLT 216 197   Cardiac Enzymes: No results for input(s): CKTOTAL, CKMB, CKMBINDEX, TROPONINI in the last 72 hours. BNP: Invalid input(s): POCBNP D-Dimer: No results for input(s): DDIMER in the last  72 hours. Hemoglobin A1C: No results for input(s): HGBA1C in the last 72 hours. Fasting Lipid Panel: No results for input(s): CHOL, HDL, LDLCALC, TRIG, CHOLHDL, LDLDIRECT in the last 72 hours. Thyroid Function Tests:  Recent Labs  09/12/16 1328  TSH 2.227   Anemia Panel:  Recent Labs  09/12/16 1328  VITAMINB12 488  FOLATE 21.0   Coag Panel:   Lab Results  Component Value Date   INR 0.79 01/06/2012   INR 0.93 06/03/2010   INR 0.89 05/10/2010    RADIOLOGY: Ct Head Wo Contrast  Result Date: 09/11/2016 CLINICAL DATA:  Pain after fall. EXAM: CT HEAD WITHOUT CONTRAST TECHNIQUE: Contiguous axial images were obtained from the base of the skull through the vertex without intravenous contrast. COMPARISON:  Apr 25, 2016 FINDINGS: Brain: No subdural, epidural, or subarachnoid hemorrhage. Ventricles and sulci are stable. Cerebellum, brainstem, and basal cisterns are normal. No acute cortical ischemia or infarct. Moderate white matter changes again identified. No mass, mass effect, or midline shift. Vascular: Atherosclerotic changes seen in the intracranial carotid arteries.  Skull: Normal. Negative for fracture or focal lesion. Sinuses/Orbits: No acute finding. Other: No other abnormalities. IMPRESSION: No acute intracranial abnormality. Electronically Signed   By: Dorise Bullion III M.D   On: 09/11/2016 11:00   Mr Cervical Spine Wo Contrast  Result Date: 09/12/2016 CLINICAL DATA:  Initial evaluation for generalized weakness with tear earlier disc thrive, frequent falls. EXAM: MRI CERVICAL SPINE WITHOUT CONTRAST TECHNIQUE: Multiplanar, multisequence MR imaging of the cervical spine was performed. No intravenous contrast was administered. COMPARISON:  None available. FINDINGS: Alignment: Vertebral bodies normally aligned with preservation of the normal cervical lordosis. No listhesis or subluxation. Vertebrae: Vertebral body heights preserved. No evidence for acute or chronic fracture. Signal  intensity within the vertebral body bone marrow is normal. No worrisome osseous lesions. No marrow edema. Cord: Signal intensity within the cervical spinal cord is normal. Posterior Fossa, vertebral arteries, paraspinal tissues: Visualized portions of the brain and posterior fossa are unremarkable. Craniocervical junction within normal limits. Paraspinous and prevertebral soft tissues within normal limits. Normal intravascular flow voids present within the vertebral arteries bilaterally. Disc levels: C2-C3: Mild bilateral uncovertebral hypertrophy without significant stenosis. C3-C4: Mild diffuse disc bulge with disc desiccation and intervertebral disc space narrowing. Bulging disc flattens the ventral thecal sac and results in mild canal stenosis. Superimposed left-sided uncovertebral hypertrophy with resultant mild left foraminal narrowing. C4-C5: Diffuse degenerative disc bulge with intervertebral disc space narrowing and disc desiccation. Bilateral uncovertebral hypertrophy. Posterior disc bulge flattens and partially faces the ventral thecal sac results in mild canal stenosis. Resultant moderate to severe bilateral foraminal stenosis, slightly worse on the left. C5-C6: Mild diffuse degenerative disc bulge with disc desiccation and intervertebral disc space narrowing. Left greater than right uncovertebral hypertrophy. Resultant severe bilateral foraminal stenosis, left greater than right. Bulging disc flattens the ventral thecal sac and results in mild canal stenosis. C6-C7: Diffuse degenerative disc bulge with disc desiccation and intervertebral disc space narrowing. Left greater than right uncovertebral hypertrophy. Moderate left with mild right foraminal stenosis. Superimposed ligamentum flavum hypertrophy at this level. Resultant moderate canal stenosis. C7-T1: Mild diffuse disc bulge with uncovertebral hypertrophy. Mild left foraminal narrowing. No significant right foraminal stenosis. Central canal widely  patent. Partially visualized upper thoracic spine unremarkable without significant stenosis or degenerative changes. IMPRESSION: 1. No evidence for acute traumatic injury within the cervical spine. 2. Moderate multilevel degenerative spondylolysis with mild to moderate diffuse canal narrowing extending from C3-4 through C6-7, greatest at the C6-7 level. 3. Severe bilateral foraminal stenosis due to disc bulge and uncovertebral disease at C4-5 and C5-6, with moderate left foraminal narrowing at C6-7. Electronically Signed   By: Jeannine Boga M.D.   On: 09/12/2016 16:15   Mr Thoracic Spine Wo Contrast  Result Date: 09/12/2016 CLINICAL DATA:  Persistent mid and low back pain. Multiple recent falls. EXAM: MRI THORACIC SPINE WITHOUT CONTRAST TECHNIQUE: Multiplanar, multisequence MR imaging of the thoracic spine was performed. No intravenous contrast was administered. COMPARISON:  Radiographs dated 07/07/2016 and lumbar MRI dated 06/25/2016 and chest CT dated 05/15/2013 FINDINGS: Alignment: There is accentuation of the thoracic kyphosis. No subluxation. Vertebrae: There is a subacute subtle benign-appearing compression fracture of the T3 vertebral body with slight residual edema in the vertebra. No protrusion of bone or disc material into the spinal canal. No bone destruction. There are old slight anterior wedge deformities of T5 and T6, unchanged since the prior CT scan. Degenerative changes of the vertebral endplates at [id number], chronic. Cord:  Normal.  Conus tip at T12-L1. Paraspinal and  other soft tissues: There is a small left pleural effusion and a tiny right effusion. There is aneurysmal dilatation of the arch of the aorta to a diameter of 4.1 cm at the top of the ascending aorta. Aortic atherosclerosis. Disc levels: The discs throughout the thoracic spine demonstrate no protrusion or bulging. No foraminal or spinal stenosis. IMPRESSION: 1. Subacute mild benign-appearing compression fracture of the T3  vertebral body. No neural impingement. 2. Old compression deformities of T5 and T6, stable. 3. Small left pleural effusion. 4. Slight aneurysmal dilatation of the top of the ascending thoracic aorta to a diameter of 4.1 cm. 5. Aortic atherosclerosis. Electronically Signed   By: Lorriane Shire M.D.   On: 09/12/2016 16:43   Mr Lumbar Spine Wo Contrast  Result Date: 09/12/2016 CLINICAL DATA:  History of falls.  Failure to thrive. EXAM: MRI LUMBAR SPINE WITHOUT CONTRAST TECHNIQUE: Multiplanar, multisequence MR imaging of the lumbar spine was performed. No intravenous contrast was administered. COMPARISON:  06/25/2016; 07/07/2016 FINDINGS: Despite efforts by the technologist and patient, severe motion artifact is present on today's exam and could not be eliminated. This reduces exam sensitivity and specificity. Segmentation: The lowest lumbar type non-rib-bearing vertebra is labeled as L5. Alignment: 8 mm anterolisthesis at L5-S1 associated with bilateral L5 pars defects. Vertebrae: Type 1 degenerative endplate findings eccentric to the right at L5-S1, fairly similar to the prior exam. Remote inferior endplate compressions at L5 and L2, no change from prior. There is some subtle lower sacral or sacrococcygeal edema and low-level presacral edema does not visibly present previously, then the possibility of a subtle fracture in the sacrococcygeal region is raised, although given the degree of motion artifact, I am not entirely certain. Conus medullaris: Extends to the T12-L1 level and appears normal. Paraspinal and other soft tissues: On inversion recovery weighted images there is some equivocal edema in the paraspinal musculature of the lumbar spine. Disc levels: L1-2:  Unremarkable. L2-3:  No definite impingement. L3-4:  No definite impingement. L4-5: Stable mild left foraminal stenosis due to disc uncovering and left facet arthropathy. L5-S1: Stable mild right and borderline left foraminal stenosis due to disc  uncovering and facet arthropathy. IMPRESSION: 1. Subtle edema in the sacrococcygeal region could reflect a nondisplaced fracture. Motion artifact makes this finding not entirely certain. 2. Low-level paraspinal muscular edema, possible muscular strain. 3. No new compression fracture in the lumbar spine is identified. Old inferior endplate compressions at L5 and L2. 4. Stable mild impingement at L4-5 and L5-S1. 5. Stable appearance of bilateral pars defects at L5 with 8 mm anterolisthesis of L5 on S1. Electronically Signed   By: Van Clines M.D.   On: 09/12/2016 16:55    Assessment & Plan    1. Sinus Bradycardia - presented after a fall at home in which she reported weakness along her lower extremities. Reported hitting her head but denied a loss of consciousness. No prodromal symptoms noted. - HR has been variable in 40's - 70's while in the ED. No significant pauses on telemetry. Went back into atrial fibrillation with RVR.  This is consistent with tachybrady syndrome.  Will ask EP to see.  May need PPM to treat bradyarrhythmias so we can manage her PAF.  Her swings in BP limit titration of rate controlling drugs as well.    2. Hypotension - BP has been normal to elevated over the past 24 hours.  - Restart Lisinopril 10mg  daily  3. Paroxysmal Atrial Fibrillation - Converted to afib with RVR this  am.  May have been triggered by hypokalemia.  Need to keep K > 4. - This patients CHA2DS2-VASc Score and unadjusted Ischemic Stroke Rate (% per year) is equal to 7.2 % stroke rate/year from a score of 5 (CHF, HTN, Female, Age (2)). Continue Eliquis for anticoagulation. Reduced dosing in setting of weight of 46 kg and age of 51. - Cardizem had been held in the setting of bradycardia and hypotension.  - EP consult for ? PPM with BB therapy vs AAD with Tikosyn   4. Alcohol Use - consumes 3-5 oz of liquor per day. - currently on CIWA protocol. Per admitting team  5. Tobacco Use - cessation  advised.  6.  Hypokalemia - replete per Primary team    Fransico Him, MD  09/13/2016  1:42 PM

## 2016-09-14 DIAGNOSIS — Y92010 Kitchen of single-family (private) house as the place of occurrence of the external cause: Secondary | ICD-10-CM | POA: Diagnosis not present

## 2016-09-14 DIAGNOSIS — K709 Alcoholic liver disease, unspecified: Secondary | ICD-10-CM | POA: Diagnosis present

## 2016-09-14 DIAGNOSIS — W1830XA Fall on same level, unspecified, initial encounter: Secondary | ICD-10-CM | POA: Diagnosis present

## 2016-09-14 DIAGNOSIS — E876 Hypokalemia: Secondary | ICD-10-CM | POA: Diagnosis not present

## 2016-09-14 DIAGNOSIS — R531 Weakness: Secondary | ICD-10-CM

## 2016-09-14 DIAGNOSIS — I2584 Coronary atherosclerosis due to calcified coronary lesion: Secondary | ICD-10-CM | POA: Diagnosis present

## 2016-09-14 DIAGNOSIS — I2510 Atherosclerotic heart disease of native coronary artery without angina pectoris: Secondary | ICD-10-CM | POA: Diagnosis not present

## 2016-09-14 DIAGNOSIS — G589 Mononeuropathy, unspecified: Secondary | ICD-10-CM | POA: Diagnosis present

## 2016-09-14 DIAGNOSIS — E43 Unspecified severe protein-calorie malnutrition: Secondary | ICD-10-CM | POA: Diagnosis present

## 2016-09-14 DIAGNOSIS — M4802 Spinal stenosis, cervical region: Secondary | ICD-10-CM | POA: Diagnosis present

## 2016-09-14 DIAGNOSIS — R269 Unspecified abnormalities of gait and mobility: Secondary | ICD-10-CM | POA: Diagnosis not present

## 2016-09-14 DIAGNOSIS — Z789 Other specified health status: Secondary | ICD-10-CM | POA: Diagnosis not present

## 2016-09-14 DIAGNOSIS — M48061 Spinal stenosis, lumbar region without neurogenic claudication: Secondary | ICD-10-CM | POA: Diagnosis present

## 2016-09-14 DIAGNOSIS — M50221 Other cervical disc displacement at C4-C5 level: Secondary | ICD-10-CM | POA: Diagnosis present

## 2016-09-14 DIAGNOSIS — Z681 Body mass index (BMI) 19 or less, adult: Secondary | ICD-10-CM | POA: Diagnosis not present

## 2016-09-14 DIAGNOSIS — F10188 Alcohol abuse with other alcohol-induced disorder: Secondary | ICD-10-CM | POA: Diagnosis present

## 2016-09-14 DIAGNOSIS — G721 Alcoholic myopathy: Secondary | ICD-10-CM | POA: Diagnosis present

## 2016-09-14 DIAGNOSIS — I9589 Other hypotension: Secondary | ICD-10-CM | POA: Diagnosis present

## 2016-09-14 DIAGNOSIS — I495 Sick sinus syndrome: Secondary | ICD-10-CM | POA: Diagnosis present

## 2016-09-14 DIAGNOSIS — F17200 Nicotine dependence, unspecified, uncomplicated: Secondary | ICD-10-CM | POA: Diagnosis present

## 2016-09-14 DIAGNOSIS — R64 Cachexia: Secondary | ICD-10-CM | POA: Diagnosis present

## 2016-09-14 DIAGNOSIS — Y999 Unspecified external cause status: Secondary | ICD-10-CM | POA: Diagnosis not present

## 2016-09-14 DIAGNOSIS — I4891 Unspecified atrial fibrillation: Secondary | ICD-10-CM | POA: Diagnosis not present

## 2016-09-14 DIAGNOSIS — F1010 Alcohol abuse, uncomplicated: Secondary | ICD-10-CM | POA: Diagnosis not present

## 2016-09-14 DIAGNOSIS — E869 Volume depletion, unspecified: Secondary | ICD-10-CM | POA: Diagnosis present

## 2016-09-14 DIAGNOSIS — I712 Thoracic aortic aneurysm, without rupture: Secondary | ICD-10-CM | POA: Diagnosis present

## 2016-09-14 DIAGNOSIS — S50311A Abrasion of right elbow, initial encounter: Secondary | ICD-10-CM | POA: Diagnosis present

## 2016-09-14 DIAGNOSIS — I480 Paroxysmal atrial fibrillation: Secondary | ICD-10-CM | POA: Diagnosis present

## 2016-09-14 DIAGNOSIS — R296 Repeated falls: Secondary | ICD-10-CM | POA: Diagnosis present

## 2016-09-14 DIAGNOSIS — E871 Hypo-osmolality and hyponatremia: Secondary | ICD-10-CM

## 2016-09-14 DIAGNOSIS — I5032 Chronic diastolic (congestive) heart failure: Secondary | ICD-10-CM | POA: Diagnosis present

## 2016-09-14 DIAGNOSIS — R001 Bradycardia, unspecified: Secondary | ICD-10-CM | POA: Diagnosis not present

## 2016-09-14 DIAGNOSIS — I951 Orthostatic hypotension: Secondary | ICD-10-CM | POA: Diagnosis not present

## 2016-09-14 DIAGNOSIS — I110 Hypertensive heart disease with heart failure: Secondary | ICD-10-CM | POA: Diagnosis present

## 2016-09-14 DIAGNOSIS — R627 Adult failure to thrive: Secondary | ICD-10-CM | POA: Diagnosis present

## 2016-09-14 LAB — BASIC METABOLIC PANEL
Anion gap: 11 (ref 5–15)
BUN: 5 mg/dL — AB (ref 6–20)
CALCIUM: 9.1 mg/dL (ref 8.9–10.3)
CHLORIDE: 96 mmol/L — AB (ref 101–111)
CO2: 26 mmol/L (ref 22–32)
CREATININE: 0.55 mg/dL (ref 0.44–1.00)
GFR calc non Af Amer: >60 mL/min (ref >60)
Glucose, Bld: 97 mg/dL (ref 65–99)
Potassium: 3.3 mmol/L — ABNORMAL LOW (ref 3.5–5.1)
SODIUM: 133 mmol/L — AB (ref 135–145)

## 2016-09-14 LAB — CBC
HCT: 41.1 % (ref 36.0–46.0)
HEMOGLOBIN: 14.4 g/dL (ref 12.0–15.0)
MCH: 33.4 pg (ref 26.0–34.0)
MCHC: 35 g/dL (ref 30.0–36.0)
MCV: 95.4 fL (ref 78.0–100.0)
Platelets: 195 10*3/uL (ref 150–400)
RBC: 4.31 MIL/uL (ref 3.87–5.11)
RDW: 12.7 % (ref 11.5–15.5)
WBC: 7.9 10*3/uL (ref 4.0–10.5)

## 2016-09-14 LAB — VITAMIN B1: VITAMIN B1 (THIAMINE): 284.7 nmol/L — AB (ref 66.5–200.0)

## 2016-09-14 LAB — CORTISOL: CORTISOL PLASMA: 16.1 ug/dL

## 2016-09-14 LAB — MAGNESIUM: Magnesium: 1.9 mg/dL (ref 1.7–2.4)

## 2016-09-14 MED ORDER — POTASSIUM CHLORIDE CRYS ER 20 MEQ PO TBCR
40.0000 meq | EXTENDED_RELEASE_TABLET | Freq: Once | ORAL | Status: AC
  Administered 2016-09-14: 40 meq via ORAL
  Filled 2016-09-14: qty 2

## 2016-09-14 MED ORDER — LISINOPRIL 10 MG PO TABS
20.0000 mg | ORAL_TABLET | Freq: Every day | ORAL | Status: DC
  Administered 2016-09-14 – 2016-09-17 (×4): 20 mg via ORAL
  Filled 2016-09-14 (×4): qty 2

## 2016-09-14 MED ORDER — DILTIAZEM HCL 30 MG PO TABS
30.0000 mg | ORAL_TABLET | Freq: Four times a day (QID) | ORAL | Status: DC | PRN
  Administered 2016-09-14: 30 mg via ORAL
  Filled 2016-09-14 (×2): qty 1

## 2016-09-14 MED ORDER — POTASSIUM CHLORIDE CRYS ER 20 MEQ PO TBCR
40.0000 meq | EXTENDED_RELEASE_TABLET | Freq: Every day | ORAL | Status: DC
  Administered 2016-09-15: 40 meq via ORAL
  Filled 2016-09-14: qty 2

## 2016-09-14 MED ORDER — MAGNESIUM SULFATE 2 GM/50ML IV SOLN
2.0000 g | Freq: Once | INTRAVENOUS | Status: AC
Start: 2016-09-14 — End: 2016-09-14
  Administered 2016-09-14: 2 g via INTRAVENOUS
  Filled 2016-09-14: qty 50

## 2016-09-14 NOTE — Progress Notes (Signed)
PROGRESS NOTE    Lauren Maxwell  [id number] DOB: Sep 26, 1931 DOA: 09/11/2016 PCP: Mayra Neer, MD   Outpatient Specialists:     Brief Narrative:  Lauren Maxwell is a 82 y.o. female with a Past Medical History of depression, anxiety, AAA, follicular lymphoma, HTN, smoking who presents with symptomatically bradycardia and hypotension in the setting of mild metabolic derangements with failure to thrive in the setting of continued chronic alcohol use and abuse. Neuro and cardiology following.     Assessment & Plan:   Principal Problem:   Bradycardia Active Problems:   Smoking   Chronic diastolic heart failure (HCC)   Coronary artery disease involving native coronary artery of native heart without angina pectoris   Anticoagulation therapy not indicated   Generalized weakness   Hypotension   Hypokalemia   ETOH abuse   Hyponatremia   Protein-calorie malnutrition, severe   Tachy/Brady syndrome? -cards/EP following -PRN diltiazem -TSH ok -to follow up outpatient with EP-- ? 30 day event monitor    Hypokalemia./hypomagnesium -replete to 4 for K and > 2 for Mg- recheck in AM -suspect difficult to replete due to patient not eating-- 0% of meals is what is documented-- expanded diet to regular -if not better in the AM, will get nephro consult  Chronic diastolic heart failure. Appears compensated. Home medications include lisinopril. Echo done last year reveals a mild LVH with an EF of 60% and grade 1 diastolic dysfunction -Daily weights -Monitor urine output   Hyponatremia. Likely related to decreased oral intake in the setting of EtOH use Recheck in AM  EtOH use. Patient reports 1-2 drinks a day. No signs symptoms of withdrawals on admission -CIWA protocol -Monitor  CAD. She denies chest pain. EKG without acute changes. -Monitor. -See #1  Tobacco use. -Cessation counseling offered  Generalized weakness/failure to thrive/frequent falls. In the  setting of chronic anxiety/depression and ETOH use. No sign of infectious process. Mild metabolic derangements. -MRI c-spine, lumbar spine and thoracic spine- lots of issues-- defer to neuro--  MRI brain with chronic changes and will need outpatient EMG -B1 pending-- thiamine added -PT consult- SNF  Severe malnutrition in context of chronic illness  Hypotension. -Gentle IV fluids -resolved  DVT prophylaxis:  Fully anticoagulated   Code Status: Full Code   Family Communication: Patient and son  Disposition Plan:  Home with 24 hour supervision once HR controlled   Consultants:   Cards  EP  neuro    Subjective: Not eating as she does not like the food  Objective: Vitals:   09/13/16 1259 09/13/16 1800 09/13/16 2008 09/14/16 0335  BP: 125/60 138/70 (!) 172/70 (!) 159/98  Pulse: 63 72 80 92  Resp: 17  20 20   Temp: 97.7 F (36.5 C)  97.5 F (36.4 C) 97.6 F (36.4 C)  TempSrc: Oral  Oral Oral  SpO2: 95%  94% 93%  Weight:    43.9 kg (96 lb 12.8 oz)  Height:        Intake/Output Summary (Last 24 hours) at 09/14/16 1247 Last data filed at 09/14/16 0900  Gross per 24 hour  Intake              720 ml  Output              901 ml  Net             -181 ml   Filed Weights   09/11/16 0955 09/14/16 0335  Weight: 46.3 kg (102 lb) 43.9 kg (96 lb  12.8 oz)    Examination:  General exam: frail appearing Respiratory system: Clear to auscultation. Respiratory effort normal. Cardiovascular system: irr and fast, No pedal edema. Gastrointestinal system: Abdomen is nondistended, soft and nontender. No organomegaly or masses felt. Normal bowel sounds heard. Central nervous system: Alert and oriented. No focal neurological deficits. Extremities: weak      Data Reviewed: I have personally reviewed following labs and imaging studies  CBC:  Recent Labs Lab 09/11/16 1200 09/13/16 0410 09/14/16 0325  WBC 8.7 7.0 7.9  HGB 15.5* 14.0 14.4  HCT 44.4 41.1 41.1  MCV  95.5 97.6 95.4  PLT 216 197 0000000   Basic Metabolic Panel:  Recent Labs Lab 09/11/16 1200 09/12/16 1328 09/13/16 0410 09/14/16 0325  NA 136 132* 132* 133*  K 3.4* 3.0* 3.3* 3.3*  CL 99* 97* 99* 96*  CO2 25 24 24 26   GLUCOSE 90 121* 104* 97  BUN 6 6 <5* 5*  CREATININE 0.66 0.61 0.51 0.55  CALCIUM 9.5 9.3 9.0 9.1  MG  --   --  1.5* 1.9   GFR: Estimated Creatinine Clearance: 37.6 mL/min (by C-G formula based on SCr of 0.55 mg/dL). Liver Function Tests:  Recent Labs Lab 09/12/16 1328 09/13/16 0410  AST 33 27  ALT 22 21  ALKPHOS 63 59  BILITOT 1.9* 1.4*  PROT 5.7* 5.3*  ALBUMIN 3.8 3.3*   No results for input(s): LIPASE, AMYLASE in the last 168 hours. No results for input(s): AMMONIA in the last 168 hours. Coagulation Profile: No results for input(s): INR, PROTIME in the last 168 hours. Cardiac Enzymes: No results for input(s): CKTOTAL, CKMB, CKMBINDEX, TROPONINI in the last 168 hours. BNP (last 3 results) No results for input(s): PROBNP in the last 8760 hours. HbA1C: No results for input(s): HGBA1C in the last 72 hours. CBG: No results for input(s): GLUCAP in the last 168 hours. Lipid Profile: No results for input(s): CHOL, HDL, LDLCALC, TRIG, CHOLHDL, LDLDIRECT in the last 72 hours. Thyroid Function Tests:  Recent Labs  09/12/16 1328  TSH 2.227   Anemia Panel:  Recent Labs  09/12/16 1328  VITAMINB12 488  FOLATE 21.0   Urine analysis:    Component Value Date/Time   COLORURINE YELLOW 09/11/2016 [place] 09/11/2016 1117   LABSPEC <1.005 (L) 09/11/2016 1117   PHURINE 6.0 09/11/2016 1117   GLUCOSEU NEGATIVE 09/11/2016 1117   HGBUR SMALL (A) 09/11/2016 1117   BILIRUBINUR NEGATIVE 09/11/2016 1117   KETONESUR 15 (A) 09/11/2016 1117   PROTEINUR NEGATIVE 09/11/2016 1117   NITRITE NEGATIVE 09/11/2016 1117   LEUKOCYTESUR TRACE (A) 09/11/2016 1117     )No results found for this or any previous visit (from the past 240 hour(s)).     Anti-infectives    None       Radiology Studies: Mr Brain Wo Contrast  Result Date: 09/13/2016 CLINICAL DATA:  Slurred speech and difficulty walking. EXAM: MRI HEAD WITHOUT CONTRAST TECHNIQUE: Multiplanar, multiecho pulse sequences of the brain and surrounding structures were obtained without intravenous contrast. COMPARISON:  Head CT 09/11/2016 FINDINGS: Brain: No acute infarct or intraparenchymal hemorrhage. The midline structures are normal. There is beginning confluent hyperintense T2-weighted signal within the periventricular and deep white matter, most often seen in the setting of chronic microvascular ischemia. No mass lesion or midline shift. No hydrocephalus or extra-axial fluid collection. Vascular: Major intracranial arterial and venous sinus flow voids are preserved. No evidence of chronic microhemorrhage or amyloid angiopathy. Skull and upper cervical spine: The  visualized skull base, calvarium, upper cervical spine and extracranial soft tissues are normal. Sinuses/Orbits: No fluid levels or advanced mucosal thickening. No mastoid effusion. Normal orbits. IMPRESSION: 1. No acute intracranial abnormality. 2. Chronic microvascular ischemic disease. Electronically Signed   By: Ulyses Jarred M.D.   On: 09/13/2016 21:41   Mr Cervical Spine Wo Contrast  Result Date: 09/12/2016 CLINICAL DATA:  Initial evaluation for generalized weakness with tear earlier disc thrive, frequent falls. EXAM: MRI CERVICAL SPINE WITHOUT CONTRAST TECHNIQUE: Multiplanar, multisequence MR imaging of the cervical spine was performed. No intravenous contrast was administered. COMPARISON:  None available. FINDINGS: Alignment: Vertebral bodies normally aligned with preservation of the normal cervical lordosis. No listhesis or subluxation. Vertebrae: Vertebral body heights preserved. No evidence for acute or chronic fracture. Signal intensity within the vertebral body bone marrow is normal. No worrisome osseous lesions.  No marrow edema. Cord: Signal intensity within the cervical spinal cord is normal. Posterior Fossa, vertebral arteries, paraspinal tissues: Visualized portions of the brain and posterior fossa are unremarkable. Craniocervical junction within normal limits. Paraspinous and prevertebral soft tissues within normal limits. Normal intravascular flow voids present within the vertebral arteries bilaterally. Disc levels: C2-C3: Mild bilateral uncovertebral hypertrophy without significant stenosis. C3-C4: Mild diffuse disc bulge with disc desiccation and intervertebral disc space narrowing. Bulging disc flattens the ventral thecal sac and results in mild canal stenosis. Superimposed left-sided uncovertebral hypertrophy with resultant mild left foraminal narrowing. C4-C5: Diffuse degenerative disc bulge with intervertebral disc space narrowing and disc desiccation. Bilateral uncovertebral hypertrophy. Posterior disc bulge flattens and partially faces the ventral thecal sac results in mild canal stenosis. Resultant moderate to severe bilateral foraminal stenosis, slightly worse on the left. C5-C6: Mild diffuse degenerative disc bulge with disc desiccation and intervertebral disc space narrowing. Left greater than right uncovertebral hypertrophy. Resultant severe bilateral foraminal stenosis, left greater than right. Bulging disc flattens the ventral thecal sac and results in mild canal stenosis. C6-C7: Diffuse degenerative disc bulge with disc desiccation and intervertebral disc space narrowing. Left greater than right uncovertebral hypertrophy. Moderate left with mild right foraminal stenosis. Superimposed ligamentum flavum hypertrophy at this level. Resultant moderate canal stenosis. C7-T1: Mild diffuse disc bulge with uncovertebral hypertrophy. Mild left foraminal narrowing. No significant right foraminal stenosis. Central canal widely patent. Partially visualized upper thoracic spine unremarkable without significant  stenosis or degenerative changes. IMPRESSION: 1. No evidence for acute traumatic injury within the cervical spine. 2. Moderate multilevel degenerative spondylolysis with mild to moderate diffuse canal narrowing extending from C3-4 through C6-7, greatest at the C6-7 level. 3. Severe bilateral foraminal stenosis due to disc bulge and uncovertebral disease at C4-5 and C5-6, with moderate left foraminal narrowing at C6-7. Electronically Signed   By: Jeannine Boga M.D.   On: 09/12/2016 16:15   Mr Thoracic Spine Wo Contrast  Result Date: 09/12/2016 CLINICAL DATA:  Persistent mid and low back pain. Multiple recent falls. EXAM: MRI THORACIC SPINE WITHOUT CONTRAST TECHNIQUE: Multiplanar, multisequence MR imaging of the thoracic spine was performed. No intravenous contrast was administered. COMPARISON:  Radiographs dated 07/07/2016 and lumbar MRI dated 06/25/2016 and chest CT dated 05/15/2013 FINDINGS: Alignment: There is accentuation of the thoracic kyphosis. No subluxation. Vertebrae: There is a subacute subtle benign-appearing compression fracture of the T3 vertebral body with slight residual edema in the vertebra. No protrusion of bone or disc material into the spinal canal. No bone destruction. There are old slight anterior wedge deformities of T5 and T6, unchanged since the prior CT scan. Degenerative changes of the  vertebral endplates at [id number], chronic. Cord:  Normal.  Conus tip at T12-L1. Paraspinal and other soft tissues: There is a small left pleural effusion and a tiny right effusion. There is aneurysmal dilatation of the arch of the aorta to a diameter of 4.1 cm at the top of the ascending aorta. Aortic atherosclerosis. Disc levels: The discs throughout the thoracic spine demonstrate no protrusion or bulging. No foraminal or spinal stenosis. IMPRESSION: 1. Subacute mild benign-appearing compression fracture of the T3 vertebral body. No neural impingement. 2. Old compression deformities of T5 and T6,  stable. 3. Small left pleural effusion. 4. Slight aneurysmal dilatation of the top of the ascending thoracic aorta to a diameter of 4.1 cm. 5. Aortic atherosclerosis. Electronically Signed   By: Lorriane Shire M.D.   On: 09/12/2016 16:43   Mr Lumbar Spine Wo Contrast  Result Date: 09/12/2016 CLINICAL DATA:  History of falls.  Failure to thrive. EXAM: MRI LUMBAR SPINE WITHOUT CONTRAST TECHNIQUE: Multiplanar, multisequence MR imaging of the lumbar spine was performed. No intravenous contrast was administered. COMPARISON:  06/25/2016; 07/07/2016 FINDINGS: Despite efforts by the technologist and patient, severe motion artifact is present on today's exam and could not be eliminated. This reduces exam sensitivity and specificity. Segmentation: The lowest lumbar type non-rib-bearing vertebra is labeled as L5. Alignment: 8 mm anterolisthesis at L5-S1 associated with bilateral L5 pars defects. Vertebrae: Type 1 degenerative endplate findings eccentric to the right at L5-S1, fairly similar to the prior exam. Remote inferior endplate compressions at L5 and L2, no change from prior. There is some subtle lower sacral or sacrococcygeal edema and low-level presacral edema does not visibly present previously, then the possibility of a subtle fracture in the sacrococcygeal region is raised, although given the degree of motion artifact, I am not entirely certain. Conus medullaris: Extends to the T12-L1 level and appears normal. Paraspinal and other soft tissues: On inversion recovery weighted images there is some equivocal edema in the paraspinal musculature of the lumbar spine. Disc levels: L1-2:  Unremarkable. L2-3:  No definite impingement. L3-4:  No definite impingement. L4-5: Stable mild left foraminal stenosis due to disc uncovering and left facet arthropathy. L5-S1: Stable mild right and borderline left foraminal stenosis due to disc uncovering and facet arthropathy. IMPRESSION: 1. Subtle edema in the sacrococcygeal region  could reflect a nondisplaced fracture. Motion artifact makes this finding not entirely certain. 2. Low-level paraspinal muscular edema, possible muscular strain. 3. No new compression fracture in the lumbar spine is identified. Old inferior endplate compressions at L5 and L2. 4. Stable mild impingement at L4-5 and L5-S1. 5. Stable appearance of bilateral pars defects at L5 with 8 mm anterolisthesis of L5 on S1. Electronically Signed   By: Van Clines M.D.   On: 09/12/2016 16:55        Scheduled Meds: . apixaban  2.5 mg Oral BID  . feeding supplement  1 Container Oral TID BM  . folic acid  1 mg Oral Daily  . Influenza vac split quadrivalent PF  0.5 mL Intramuscular Tomorrow-1000  . multivitamin with minerals  1 tablet Oral Daily  . potassium chloride  40 mEq Oral Once  . [START ON 09/15/2016] potassium chloride  40 mEq Oral Daily  . sodium chloride flush  3 mL Intravenous Q12H  . thiamine  100 mg Oral Daily   Or  . thiamine  100 mg Intravenous Daily   Continuous Infusions:    LOS: 0 days    Time spent: 25 min  [place], DO [facility] Pager [phone]  If 7PM-7AM, please contact night-coverage [email or web address] Password [email or web address] 09/14/2016, 12:47 PM

## 2016-09-14 NOTE — Progress Notes (Signed)
SUBJECTIVE:  Denies chest pain or SOB.  No palpitations  OBJECTIVE:   Vitals:   Vitals:   09/13/16 1259 09/13/16 1800 09/13/16 2008 09/14/16 0335  BP: 125/60 138/70 (!) 172/70 (!) 159/98  Pulse: 63 72 80 92  Resp: 17  20 20   Temp: 97.7 F (36.5 C)  97.5 F (36.4 C) 97.6 F (36.4 C)  TempSrc: Oral  Oral Oral  SpO2: 95%  94% 93%  Weight:    96 lb 12.8 oz (43.9 kg)  Height:       I&O's:    Intake/Output Summary (Last 24 hours) at 09/14/16 [id number] Last data filed at 09/13/16 2230  Gross per 24 hour  Intake              480 ml  Output              701 ml  Net             -221 ml   TELEMETRY: Reviewed telemetry pt in atrial fibrillation: with RVR    PHYSICAL EXAM General: Well developed, well nourished, in no acute distress Head: Eyes PERRLA, No xanthomas.   Normal cephalic and atramatic  Lungs:   Clear bilaterally to auscultation and percussion. Heart:   Irregularly irregular S1 S2 Pulses are 2+ & equal. Abdomen: Bowel sounds are positive, abdomen soft and non-tender without masses Msk:  Back normal, normal gait. Normal strength and tone for age. Extremities:   No clubbing, cyanosis or edema.  DP +1 Neuro: Alert and oriented X 3. Psych:  Good affect, responds appropriately   LABS: Basic Metabolic Panel:  Recent Labs  09/13/16 0410 09/14/16 0325  NA 132* 133*  K 3.3* 3.3*  CL 99* 96*  CO2 24 26  GLUCOSE 104* 97  BUN <5* 5*  CREATININE 0.51 0.55  CALCIUM 9.0 9.1  MG 1.5*  --    Liver Function Tests:  Recent Labs  09/12/16 1328 09/13/16 0410  AST 33 27  ALT 22 21  ALKPHOS 63 59  BILITOT 1.9* 1.4*  PROT 5.7* 5.3*  ALBUMIN 3.8 3.3*   No results for input(s): LIPASE, AMYLASE in the last 72 hours. CBC:  Recent Labs  09/13/16 0410 09/14/16 0325  WBC 7.0 7.9  HGB 14.0 14.4  HCT 41.1 41.1  MCV 97.6 95.4  PLT 197 195   Cardiac Enzymes: No results for input(s): CKTOTAL, CKMB, CKMBINDEX, TROPONINI in the last 72 hours. BNP: Invalid input(s):  POCBNP D-Dimer: No results for input(s): DDIMER in the last 72 hours. Hemoglobin A1C: No results for input(s): HGBA1C in the last 72 hours. Fasting Lipid Panel: No results for input(s): CHOL, HDL, LDLCALC, TRIG, CHOLHDL, LDLDIRECT in the last 72 hours. Thyroid Function Tests:  Recent Labs  09/12/16 1328  TSH 2.227   Anemia Panel:  Recent Labs  09/12/16 1328  VITAMINB12 488  FOLATE 21.0   Coag Panel:   Lab Results  Component Value Date   INR 0.79 01/06/2012   INR 0.93 06/03/2010   INR 0.89 05/10/2010    RADIOLOGY: Ct Head Wo Contrast  Result Date: 09/11/2016 CLINICAL DATA:  Pain after fall. EXAM: CT HEAD WITHOUT CONTRAST TECHNIQUE: Contiguous axial images were obtained from the base of the skull through the vertex without intravenous contrast. COMPARISON:  Apr 25, 2016 FINDINGS: Brain: No subdural, epidural, or subarachnoid hemorrhage. Ventricles and sulci are stable. Cerebellum, brainstem, and basal cisterns are normal. No acute cortical ischemia or infarct. Moderate white matter changes again identified. No  mass, mass effect, or midline shift. Vascular: Atherosclerotic changes seen in the intracranial carotid arteries. Skull: Normal. Negative for fracture or focal lesion. Sinuses/Orbits: No acute finding. Other: No other abnormalities. IMPRESSION: No acute intracranial abnormality. Electronically Signed   By: Dorise Bullion III M.D   On: 09/11/2016 11:00   Mr Brain Wo Contrast  Result Date: 09/13/2016 CLINICAL DATA:  Slurred speech and difficulty walking. EXAM: MRI HEAD WITHOUT CONTRAST TECHNIQUE: Multiplanar, multiecho pulse sequences of the brain and surrounding structures were obtained without intravenous contrast. COMPARISON:  Head CT 09/11/2016 FINDINGS: Brain: No acute infarct or intraparenchymal hemorrhage. The midline structures are normal. There is beginning confluent hyperintense T2-weighted signal within the periventricular and deep white matter, most often seen in  the setting of chronic microvascular ischemia. No mass lesion or midline shift. No hydrocephalus or extra-axial fluid collection. Vascular: Major intracranial arterial and venous sinus flow voids are preserved. No evidence of chronic microhemorrhage or amyloid angiopathy. Skull and upper cervical spine: The visualized skull base, calvarium, upper cervical spine and extracranial soft tissues are normal. Sinuses/Orbits: No fluid levels or advanced mucosal thickening. No mastoid effusion. Normal orbits. IMPRESSION: 1. No acute intracranial abnormality. 2. Chronic microvascular ischemic disease. Electronically Signed   By: Ulyses Jarred M.D.   On: 09/13/2016 21:41   Mr Cervical Spine Wo Contrast  Result Date: 09/12/2016 CLINICAL DATA:  Initial evaluation for generalized weakness with tear earlier disc thrive, frequent falls. EXAM: MRI CERVICAL SPINE WITHOUT CONTRAST TECHNIQUE: Multiplanar, multisequence MR imaging of the cervical spine was performed. No intravenous contrast was administered. COMPARISON:  None available. FINDINGS: Alignment: Vertebral bodies normally aligned with preservation of the normal cervical lordosis. No listhesis or subluxation. Vertebrae: Vertebral body heights preserved. No evidence for acute or chronic fracture. Signal intensity within the vertebral body bone marrow is normal. No worrisome osseous lesions. No marrow edema. Cord: Signal intensity within the cervical spinal cord is normal. Posterior Fossa, vertebral arteries, paraspinal tissues: Visualized portions of the brain and posterior fossa are unremarkable. Craniocervical junction within normal limits. Paraspinous and prevertebral soft tissues within normal limits. Normal intravascular flow voids present within the vertebral arteries bilaterally. Disc levels: C2-C3: Mild bilateral uncovertebral hypertrophy without significant stenosis. C3-C4: Mild diffuse disc bulge with disc desiccation and intervertebral disc space narrowing.  Bulging disc flattens the ventral thecal sac and results in mild canal stenosis. Superimposed left-sided uncovertebral hypertrophy with resultant mild left foraminal narrowing. C4-C5: Diffuse degenerative disc bulge with intervertebral disc space narrowing and disc desiccation. Bilateral uncovertebral hypertrophy. Posterior disc bulge flattens and partially faces the ventral thecal sac results in mild canal stenosis. Resultant moderate to severe bilateral foraminal stenosis, slightly worse on the left. C5-C6: Mild diffuse degenerative disc bulge with disc desiccation and intervertebral disc space narrowing. Left greater than right uncovertebral hypertrophy. Resultant severe bilateral foraminal stenosis, left greater than right. Bulging disc flattens the ventral thecal sac and results in mild canal stenosis. C6-C7: Diffuse degenerative disc bulge with disc desiccation and intervertebral disc space narrowing. Left greater than right uncovertebral hypertrophy. Moderate left with mild right foraminal stenosis. Superimposed ligamentum flavum hypertrophy at this level. Resultant moderate canal stenosis. C7-T1: Mild diffuse disc bulge with uncovertebral hypertrophy. Mild left foraminal narrowing. No significant right foraminal stenosis. Central canal widely patent. Partially visualized upper thoracic spine unremarkable without significant stenosis or degenerative changes. IMPRESSION: 1. No evidence for acute traumatic injury within the cervical spine. 2. Moderate multilevel degenerative spondylolysis with mild to moderate diffuse canal narrowing extending from C3-4 through C6-7,  greatest at the C6-7 level. 3. Severe bilateral foraminal stenosis due to disc bulge and uncovertebral disease at C4-5 and C5-6, with moderate left foraminal narrowing at C6-7. Electronically Signed   By: Jeannine Boga M.D.   On: 09/12/2016 16:15   Mr Thoracic Spine Wo Contrast  Result Date: 09/12/2016 CLINICAL DATA:  Persistent mid and  low back pain. Multiple recent falls. EXAM: MRI THORACIC SPINE WITHOUT CONTRAST TECHNIQUE: Multiplanar, multisequence MR imaging of the thoracic spine was performed. No intravenous contrast was administered. COMPARISON:  Radiographs dated 07/07/2016 and lumbar MRI dated 06/25/2016 and chest CT dated 05/15/2013 FINDINGS: Alignment: There is accentuation of the thoracic kyphosis. No subluxation. Vertebrae: There is a subacute subtle benign-appearing compression fracture of the T3 vertebral body with slight residual edema in the vertebra. No protrusion of bone or disc material into the spinal canal. No bone destruction. There are old slight anterior wedge deformities of T5 and T6, unchanged since the prior CT scan. Degenerative changes of the vertebral endplates at [id number], chronic. Cord:  Normal.  Conus tip at T12-L1. Paraspinal and other soft tissues: There is a small left pleural effusion and a tiny right effusion. There is aneurysmal dilatation of the arch of the aorta to a diameter of 4.1 cm at the top of the ascending aorta. Aortic atherosclerosis. Disc levels: The discs throughout the thoracic spine demonstrate no protrusion or bulging. No foraminal or spinal stenosis. IMPRESSION: 1. Subacute mild benign-appearing compression fracture of the T3 vertebral body. No neural impingement. 2. Old compression deformities of T5 and T6, stable. 3. Small left pleural effusion. 4. Slight aneurysmal dilatation of the top of the ascending thoracic aorta to a diameter of 4.1 cm. 5. Aortic atherosclerosis. Electronically Signed   By: Lorriane Shire M.D.   On: 09/12/2016 16:43   Mr Lumbar Spine Wo Contrast  Result Date: 09/12/2016 CLINICAL DATA:  History of falls.  Failure to thrive. EXAM: MRI LUMBAR SPINE WITHOUT CONTRAST TECHNIQUE: Multiplanar, multisequence MR imaging of the lumbar spine was performed. No intravenous contrast was administered. COMPARISON:  06/25/2016; 07/07/2016 FINDINGS: Despite efforts by the technologist  and patient, severe motion artifact is present on today's exam and could not be eliminated. This reduces exam sensitivity and specificity. Segmentation: The lowest lumbar type non-rib-bearing vertebra is labeled as L5. Alignment: 8 mm anterolisthesis at L5-S1 associated with bilateral L5 pars defects. Vertebrae: Type 1 degenerative endplate findings eccentric to the right at L5-S1, fairly similar to the prior exam. Remote inferior endplate compressions at L5 and L2, no change from prior. There is some subtle lower sacral or sacrococcygeal edema and low-level presacral edema does not visibly present previously, then the possibility of a subtle fracture in the sacrococcygeal region is raised, although given the degree of motion artifact, I am not entirely certain. Conus medullaris: Extends to the T12-L1 level and appears normal. Paraspinal and other soft tissues: On inversion recovery weighted images there is some equivocal edema in the paraspinal musculature of the lumbar spine. Disc levels: L1-2:  Unremarkable. L2-3:  No definite impingement. L3-4:  No definite impingement. L4-5: Stable mild left foraminal stenosis due to disc uncovering and left facet arthropathy. L5-S1: Stable mild right and borderline left foraminal stenosis due to disc uncovering and facet arthropathy. IMPRESSION: 1. Subtle edema in the sacrococcygeal region could reflect a nondisplaced fracture. Motion artifact makes this finding not entirely certain. 2. Low-level paraspinal muscular edema, possible muscular strain. 3. No new compression fracture in the lumbar spine is identified. Old inferior endplate compressions  at L5 and L2. 4. Stable mild impingement at L4-5 and L5-S1. 5. Stable appearance of bilateral pars defects at L5 with 8 mm anterolisthesis of L5 on S1. Electronically Signed   By: Van Clines M.D.   On: 09/12/2016 16:55    Assessment & Plan    1. Sinus Bradycardia - presented after a fall at home in which she reported  weakness along her lower extremities. Reported hitting her head but denied a loss of consciousness. No prodromal symptoms noted. - HR has been variable in 40's - 70's while in the ED. No significant pauses on telemetry. Went back into atrial fibrillation with RVR yesterday. Possible tachybrady syndrome but we do not have any documented strips with significant bradycardia.  Appreciated EP input.  No indication for  PPM at this time unless we can correlate patient's symptoms with bradycardia.  Would benefit from 30 day heart monitor at discharge which we will set up.  Will set up OV in afib clinic in 3-4 weeks.  Cardizem stopped.    2. Hypotension - BP has been normal to elevated over the past 24 hours.  - Restarted Lisinopril 10mg  yesterday.  Will increase to 20mg  daily which was her home dose.   3. Paroxysmal Atrial Fibrillation - In and out of PAF with RVR..  May have been triggered by hypokalemia.  Need to keep K > 4. - This patients CHA2DS2-VASc Score and unadjusted Ischemic Stroke Rate (% per year) is equal to 7.2 % stroke rate/year from a score of 5 (CHF, HTN, Female, Age (2)). Continue Eliquis for anticoagulation. Reduced dosing in setting of weight of 46 kg and age of 51. - Cardizem long acting has been stopped and using short acting for breakthrough. - ? Whether hypokalemia is triggering her afib.  Once potassium has been consistently > 4, if the continues to have PAF, may need to consider AAD with Amio or Tikosyn.  Cannot use Tikosyn in setting of persistent hypo K and hypo Mag. - appreciate EP input - will followup in afib clinic in 3-4 weeks.  4. Alcohol Use - consumes 3-5 oz of liquor per day. - currently on CIWA protocol. Per admitting team  5. Tobacco Use - cessation advised.  6.  Hypokalemia - replete per Primary team.  ? Etiology.  She is not on diuretic therapy.  She has not had diarrhea in some time.  She has had poor PO intake recently.  ? Primary renal issue with  potassium wasting.  Recommend renal consult.   7.  Weakness and difficulty walking ? Upper motor neuron dysfunction - Neuro following.  7.      Fransico Him, MD  09/14/2016  9:27 AM

## 2016-09-14 NOTE — Progress Notes (Signed)
Subjective: No changes  Exam: Vitals:   09/13/16 2008 09/14/16 0335  BP: (!) 172/70 (!) 159/98  Pulse: 80 92  Resp: 20 20  Temp: 97.5 F (36.4 C) 97.6 F (36.4 C)   Gen: In bed, NAD Resp: non-labored breathing, no acute distress Abd: soft, nt  Neuro: MS: awake, alert.  [id number], concern for possibl mild tongue atrophy Motor: 3-4/5 in the lower extremities, some possible fascics in the quadriceps.  Sensory:intact DTR:3+ at bilateral knees(with crossed adductors) and at the biceps, brisk jaw jerk.   Impression: 82 yo F with progressive difficulty walking due to leg weakness, slurred speech, hyperreflexia. No imaging findings that I think explain her symptoms. I have not seen a definite fasciulation in her legs, and I would favor more definitive evidence of lower motor neuron dysfunction prior to making a diagnosis of ALS, especially given the generalized nature of her weakness. I think, however, that this is a very real consideration at this time. The next step in diagnosis would be EMG to assess for lower motor neuron dysfunction.   Recommendations: 1) EMG as outpatient,  2) No further inpatient recommendations at this time, please call with further questions or concerns.   Roland Rack, MD [facility] [phone]  If 7pm- 7am, please page neurology on call as listed in [place].

## 2016-09-14 NOTE — Discharge Instructions (Signed)

## 2016-09-15 ENCOUNTER — Inpatient Hospital Stay (HOSPITAL_COMMUNITY): Payer: Medicare Other

## 2016-09-15 DIAGNOSIS — I480 Paroxysmal atrial fibrillation: Secondary | ICD-10-CM

## 2016-09-15 DIAGNOSIS — I4891 Unspecified atrial fibrillation: Secondary | ICD-10-CM

## 2016-09-15 LAB — MAGNESIUM: Magnesium: 2 mg/dL (ref 1.7–2.4)

## 2016-09-15 LAB — VITAMIN D 25 HYDROXY (VIT D DEFICIENCY, FRACTURES): Vit D, 25-Hydroxy: 25.9 ng/mL — ABNORMAL LOW (ref 30.0–100.0)

## 2016-09-15 LAB — BASIC METABOLIC PANEL
Anion gap: 8 (ref 5–15)
BUN: 6 mg/dL (ref 6–20)
CHLORIDE: 98 mmol/L — AB (ref 101–111)
CO2: 25 mmol/L (ref 22–32)
CREATININE: 0.52 mg/dL (ref 0.44–1.00)
Calcium: 9 mg/dL (ref 8.9–10.3)
GFR calc non Af Amer: >60 mL/min (ref >60)
GLUCOSE: 96 mg/dL (ref 65–99)
Potassium: 4.3 mmol/L (ref 3.5–5.1)
Sodium: 131 mmol/L — ABNORMAL LOW (ref 135–145)

## 2016-09-15 LAB — NA AND K (SODIUM & POTASSIUM), RAND UR
POTASSIUM UR: 20 mmol/L
SODIUM UR: 97 mmol/L

## 2016-09-15 LAB — ECHOCARDIOGRAM COMPLETE
HEIGHTINCHES: 65 in
WEIGHTICAEL: 1548.8 [oz_av]

## 2016-09-15 MED ORDER — FUROSEMIDE 10 MG/ML IJ SOLN
20.0000 mg | Freq: Once | INTRAMUSCULAR | Status: AC
  Administered 2016-09-15: 20 mg via INTRAVENOUS
  Filled 2016-09-15: qty 2

## 2016-09-15 MED ORDER — POTASSIUM CHLORIDE CRYS ER 20 MEQ PO TBCR
40.0000 meq | EXTENDED_RELEASE_TABLET | Freq: Once | ORAL | Status: AC
  Administered 2016-09-15: 40 meq via ORAL
  Filled 2016-09-15: qty 2

## 2016-09-15 NOTE — Progress Notes (Signed)
  Echocardiogram 2D Echocardiogram has been performed.  Tresa Res 09/15/2016, 3:33 PM

## 2016-09-15 NOTE — Progress Notes (Signed)
Physical Therapy Treatment Patient Details Name: Lauren Maxwell MRN: [id number] DOB: 07-Jan-1931 Today's Date: 09/15/2016    History of Present Illness Pt presented to the ER with fall 09/10/16. She states she was feeling weak and her right leg buckled, causing her to fall backwards, striking her had and bilateral elbows.  MRI 3 weeks ago with herniated disc but elected no surgery. Several falls the last few weeks. MRI 10/9 with new T3 compression fx, old T5,T6, L2, L5 fx PMHx: ETOH abuse, COPD, HTN, anxiety, AAA    PT Comments    Pt with improved mood today with willingness to attempt mobility and gait. Pt agreeable to OOB to 3in1 as well as gait trials with chair to follow. Pt with education for HEP, encouragement to perform toileting at 3in1 not bed pain, and be out of bed to chair throughout the day. Son present throughout and very encouraging of pt as well. Will continue to follow.   HR 81-101  Follow Up Recommendations  SNF;Supervision/Assistance - 24 hour     Equipment Recommendations       Recommendations for Other Services       Precautions / Restrictions Precautions Precautions: Fall Precaution Comments: right drop foot, compression fx T3    Mobility  Bed Mobility Overal bed mobility: Needs Assistance       Supine to sit: Min assist     General bed mobility comments: cues for sequence, use of rail and assist to elevate trunk from surface  Transfers Overall transfer level: Needs assistance   Transfers: Sit to/from Stand Sit to Stand: Min assist   Squat pivot transfers: Min assist     General transfer comment: pt able to stand from bed, pivot to [facility] at 90degrees then 180degree pivot from [facility] to chair. Walked and 2 additional standing trials. Cues for hand placement and sequence, assist for anterior translation and safety  Ambulation/Gait Ambulation/Gait assistance: Min assist Ambulation Distance (Feet): 14 Feet Assistive device: Rolling walker (2  wheeled) Gait Pattern/deviations: Step-through pattern;Decreased stride length;Trunk flexed Gait velocity: decreased Gait velocity interpretation: Below normal speed for age/gender General Gait Details: cues for posture, position in RW and encouragement to progress distance with chair to follow. Pt walked 5' then 14' after seated rest   Stairs            Wheelchair Mobility    Modified Rankin (Stroke Patients Only)       Balance Overall balance assessment: Needs assistance   Sitting balance-Leahy Scale: Fair       Standing balance-Leahy Scale: Poor                      Cognition Arousal/Alertness: Awake/alert Behavior During Therapy: Flat affect Overall Cognitive Status: Within Functional Limits for tasks assessed                      Exercises General Exercises - Lower Extremity Long Arc Quad: AROM;Both;Seated;15 reps Hip Flexion/Marching: AROM;15 reps;Both;Seated    General Comments        Pertinent Vitals/Pain Pain Assessment: 0-10 Pain Score: 4  Pain Location: back pain Pain Descriptors / Indicators: Sore Pain Intervention(s): Limited activity within patient's tolerance    Home Living                      Prior Function            PT Goals (current goals can now be found in the care plan  section) Progress towards PT goals: Progressing toward goals    Frequency           PT Plan Current plan remains appropriate    Co-evaluation             End of Session Equipment Utilized During Treatment: Gait belt Activity Tolerance: Patient tolerated treatment well Patient left: in chair;with call bell/phone within reach;with chair alarm set;with family/visitor present     Time: [id number] PT Time Calculation (min) (ACUTE ONLY): 25 min  Charges:  $Gait Training: 8-22 mins $Therapeutic Exercise: 8-22 mins                    G Codes:      Melford Aase 2016/10/14, 1:32 PM Elwyn Reach,  [place]

## 2016-09-15 NOTE — Progress Notes (Signed)
SUBJECTIVE:  Denies chest pain or SOB.  No palpitations  OBJECTIVE:   Vitals:   Vitals:   09/14/16 1517 09/14/16 2008 09/15/16 0211 09/15/16 0615  BP: (!) 151/90 (!) 148/88 138/74 (!) 142/75  Pulse:  89 83 83  Resp:  18 18 18   Temp:  98.1 F (36.7 C) 97.8 F (36.6 C) 97.7 F (36.5 C)  TempSrc:  Oral Oral Oral  SpO2:  93% 96% 96%  Weight:      Height:       I&O's:    Intake/Output Summary (Last 24 hours) at 09/15/16 1014 Last data filed at 09/15/16 0324  Gross per 24 hour  Intake              480 ml  Output              300 ml  Net              180 ml   TELEMETRY: Reviewed telemetry pt in NSR with no PAF    PHYSICAL EXAM General: Well developed, well nourished, in no acute distress Head: Eyes PERRLA, No xanthomas.   Normal cephalic and atramatic  Lungs:   Clear bilaterally to auscultation and percussion. Heart:  RRR S1 S2 Pulses are 2+ & equal. Abdomen: Bowel sounds are positive, abdomen soft and non-tender without masses Msk:  Back normal, normal gait. Normal strength and tone for age. Extremities:   No clubbing, cyanosis or edema.  DP +1 Neuro: Alert and oriented X 3. Psych:  Good affect, responds appropriately   LABS: Basic Metabolic Panel:  Recent Labs  09/14/16 0325 09/15/16 0239  NA 133* 131*  K 3.3* 4.3  CL 96* 98*  CO2 26 25  GLUCOSE 97 96  BUN 5* 6  CREATININE 0.55 0.52  CALCIUM 9.1 9.0  MG 1.9 2.0   Liver Function Tests:  Recent Labs  09/12/16 1328 09/13/16 0410  AST 33 27  ALT 22 21  ALKPHOS 63 59  BILITOT 1.9* 1.4*  PROT 5.7* 5.3*  ALBUMIN 3.8 3.3*   No results for input(s): LIPASE, AMYLASE in the last 72 hours. CBC:  Recent Labs  09/13/16 0410 09/14/16 0325  WBC 7.0 7.9  HGB 14.0 14.4  HCT 41.1 41.1  MCV 97.6 95.4  PLT 197 195   Cardiac Enzymes: No results for input(s): CKTOTAL, CKMB, CKMBINDEX, TROPONINI in the last 72 hours. BNP: Invalid input(s): POCBNP D-Dimer: No results for input(s): DDIMER in the last 72  hours. Hemoglobin A1C: No results for input(s): HGBA1C in the last 72 hours. Fasting Lipid Panel: No results for input(s): CHOL, HDL, LDLCALC, TRIG, CHOLHDL, LDLDIRECT in the last 72 hours. Thyroid Function Tests:  Recent Labs  09/12/16 1328  TSH 2.227   Anemia Panel:  Recent Labs  09/12/16 1328  VITAMINB12 488  FOLATE 21.0   Coag Panel:   Lab Results  Component Value Date   INR 0.79 01/06/2012   INR 0.93 06/03/2010   INR 0.89 05/10/2010    RADIOLOGY: Dg Chest 2 View  Result Date: 09/15/2016 CLINICAL DATA:  Cough, history of COPD EXAM: CHEST  2 VIEW COMPARISON:  05/22/15 FINDINGS: Cardiac shadow is mildly enlarged but stable. Aortic calcifications and tortuosity it are again seen and stable. The lungs are hyperinflated consistent with COPD. Small bilateral pleural effusions are noted left greater than right new from the prior exam. No focal infiltrate is seen. Degenerative changes of the thoracic spine are noted as well as some compression deformity  at T3 stable from the recent MRI. Stable T5 and T6 compression deformities are noted as well IMPRESSION: COPD. Bilateral pleural effusions left greater than right without definitive infiltrate. Electronically Signed   By: Inez Catalina M.D.   On: 09/15/2016 09:46   Ct Head Wo Contrast  Result Date: 09/11/2016 CLINICAL DATA:  Pain after fall. EXAM: CT HEAD WITHOUT CONTRAST TECHNIQUE: Contiguous axial images were obtained from the base of the skull through the vertex without intravenous contrast. COMPARISON:  Apr 25, 2016 FINDINGS: Brain: No subdural, epidural, or subarachnoid hemorrhage. Ventricles and sulci are stable. Cerebellum, brainstem, and basal cisterns are normal. No acute cortical ischemia or infarct. Moderate white matter changes again identified. No mass, mass effect, or midline shift. Vascular: Atherosclerotic changes seen in the intracranial carotid arteries. Skull: Normal. Negative for fracture or focal lesion.  Sinuses/Orbits: No acute finding. Other: No other abnormalities. IMPRESSION: No acute intracranial abnormality. Electronically Signed   By: Dorise Bullion III M.D   On: 09/11/2016 11:00   Mr Brain Wo Contrast  Result Date: 09/13/2016 CLINICAL DATA:  Slurred speech and difficulty walking. EXAM: MRI HEAD WITHOUT CONTRAST TECHNIQUE: Multiplanar, multiecho pulse sequences of the brain and surrounding structures were obtained without intravenous contrast. COMPARISON:  Head CT 09/11/2016 FINDINGS: Brain: No acute infarct or intraparenchymal hemorrhage. The midline structures are normal. There is beginning confluent hyperintense T2-weighted signal within the periventricular and deep white matter, most often seen in the setting of chronic microvascular ischemia. No mass lesion or midline shift. No hydrocephalus or extra-axial fluid collection. Vascular: Major intracranial arterial and venous sinus flow voids are preserved. No evidence of chronic microhemorrhage or amyloid angiopathy. Skull and upper cervical spine: The visualized skull base, calvarium, upper cervical spine and extracranial soft tissues are normal. Sinuses/Orbits: No fluid levels or advanced mucosal thickening. No mastoid effusion. Normal orbits. IMPRESSION: 1. No acute intracranial abnormality. 2. Chronic microvascular ischemic disease. Electronically Signed   By: Ulyses Jarred M.D.   On: 09/13/2016 21:41   Mr Cervical Spine Wo Contrast  Result Date: 09/12/2016 CLINICAL DATA:  Initial evaluation for generalized weakness with tear earlier disc thrive, frequent falls. EXAM: MRI CERVICAL SPINE WITHOUT CONTRAST TECHNIQUE: Multiplanar, multisequence MR imaging of the cervical spine was performed. No intravenous contrast was administered. COMPARISON:  None available. FINDINGS: Alignment: Vertebral bodies normally aligned with preservation of the normal cervical lordosis. No listhesis or subluxation. Vertebrae: Vertebral body heights preserved. No  evidence for acute or chronic fracture. Signal intensity within the vertebral body bone marrow is normal. No worrisome osseous lesions. No marrow edema. Cord: Signal intensity within the cervical spinal cord is normal. Posterior Fossa, vertebral arteries, paraspinal tissues: Visualized portions of the brain and posterior fossa are unremarkable. Craniocervical junction within normal limits. Paraspinous and prevertebral soft tissues within normal limits. Normal intravascular flow voids present within the vertebral arteries bilaterally. Disc levels: C2-C3: Mild bilateral uncovertebral hypertrophy without significant stenosis. C3-C4: Mild diffuse disc bulge with disc desiccation and intervertebral disc space narrowing. Bulging disc flattens the ventral thecal sac and results in mild canal stenosis. Superimposed left-sided uncovertebral hypertrophy with resultant mild left foraminal narrowing. C4-C5: Diffuse degenerative disc bulge with intervertebral disc space narrowing and disc desiccation. Bilateral uncovertebral hypertrophy. Posterior disc bulge flattens and partially faces the ventral thecal sac results in mild canal stenosis. Resultant moderate to severe bilateral foraminal stenosis, slightly worse on the left. C5-C6: Mild diffuse degenerative disc bulge with disc desiccation and intervertebral disc space narrowing. Left greater than right uncovertebral hypertrophy. Resultant  severe bilateral foraminal stenosis, left greater than right. Bulging disc flattens the ventral thecal sac and results in mild canal stenosis. C6-C7: Diffuse degenerative disc bulge with disc desiccation and intervertebral disc space narrowing. Left greater than right uncovertebral hypertrophy. Moderate left with mild right foraminal stenosis. Superimposed ligamentum flavum hypertrophy at this level. Resultant moderate canal stenosis. C7-T1: Mild diffuse disc bulge with uncovertebral hypertrophy. Mild left foraminal narrowing. No significant  right foraminal stenosis. Central canal widely patent. Partially visualized upper thoracic spine unremarkable without significant stenosis or degenerative changes. IMPRESSION: 1. No evidence for acute traumatic injury within the cervical spine. 2. Moderate multilevel degenerative spondylolysis with mild to moderate diffuse canal narrowing extending from C3-4 through C6-7, greatest at the C6-7 level. 3. Severe bilateral foraminal stenosis due to disc bulge and uncovertebral disease at C4-5 and C5-6, with moderate left foraminal narrowing at C6-7. Electronically Signed   By: Jeannine Boga M.D.   On: 09/12/2016 16:15   Mr Thoracic Spine Wo Contrast  Result Date: 09/12/2016 CLINICAL DATA:  Persistent mid and low back pain. Multiple recent falls. EXAM: MRI THORACIC SPINE WITHOUT CONTRAST TECHNIQUE: Multiplanar, multisequence MR imaging of the thoracic spine was performed. No intravenous contrast was administered. COMPARISON:  Radiographs dated 07/07/2016 and lumbar MRI dated 06/25/2016 and chest CT dated 05/15/2013 FINDINGS: Alignment: There is accentuation of the thoracic kyphosis. No subluxation. Vertebrae: There is a subacute subtle benign-appearing compression fracture of the T3 vertebral body with slight residual edema in the vertebra. No protrusion of bone or disc material into the spinal canal. No bone destruction. There are old slight anterior wedge deformities of T5 and T6, unchanged since the prior CT scan. Degenerative changes of the vertebral endplates at [id number], chronic. Cord:  Normal.  Conus tip at T12-L1. Paraspinal and other soft tissues: There is a small left pleural effusion and a tiny right effusion. There is aneurysmal dilatation of the arch of the aorta to a diameter of 4.1 cm at the top of the ascending aorta. Aortic atherosclerosis. Disc levels: The discs throughout the thoracic spine demonstrate no protrusion or bulging. No foraminal or spinal stenosis. IMPRESSION: 1. Subacute mild  benign-appearing compression fracture of the T3 vertebral body. No neural impingement. 2. Old compression deformities of T5 and T6, stable. 3. Small left pleural effusion. 4. Slight aneurysmal dilatation of the top of the ascending thoracic aorta to a diameter of 4.1 cm. 5. Aortic atherosclerosis. Electronically Signed   By: Lorriane Shire M.D.   On: 09/12/2016 16:43   Mr Lumbar Spine Wo Contrast  Result Date: 09/12/2016 CLINICAL DATA:  History of falls.  Failure to thrive. EXAM: MRI LUMBAR SPINE WITHOUT CONTRAST TECHNIQUE: Multiplanar, multisequence MR imaging of the lumbar spine was performed. No intravenous contrast was administered. COMPARISON:  06/25/2016; 07/07/2016 FINDINGS: Despite efforts by the technologist and patient, severe motion artifact is present on today's exam and could not be eliminated. This reduces exam sensitivity and specificity. Segmentation: The lowest lumbar type non-rib-bearing vertebra is labeled as L5. Alignment: 8 mm anterolisthesis at L5-S1 associated with bilateral L5 pars defects. Vertebrae: Type 1 degenerative endplate findings eccentric to the right at L5-S1, fairly similar to the prior exam. Remote inferior endplate compressions at L5 and L2, no change from prior. There is some subtle lower sacral or sacrococcygeal edema and low-level presacral edema does not visibly present previously, then the possibility of a subtle fracture in the sacrococcygeal region is raised, although given the degree of motion artifact, I am not entirely certain. Conus medullaris:  Extends to the T12-L1 level and appears normal. Paraspinal and other soft tissues: On inversion recovery weighted images there is some equivocal edema in the paraspinal musculature of the lumbar spine. Disc levels: L1-2:  Unremarkable. L2-3:  No definite impingement. L3-4:  No definite impingement. L4-5: Stable mild left foraminal stenosis due to disc uncovering and left facet arthropathy. L5-S1: Stable mild right and  borderline left foraminal stenosis due to disc uncovering and facet arthropathy. IMPRESSION: 1. Subtle edema in the sacrococcygeal region could reflect a nondisplaced fracture. Motion artifact makes this finding not entirely certain. 2. Low-level paraspinal muscular edema, possible muscular strain. 3. No new compression fracture in the lumbar spine is identified. Old inferior endplate compressions at L5 and L2. 4. Stable mild impingement at L4-5 and L5-S1. 5. Stable appearance of bilateral pars defects at L5 with 8 mm anterolisthesis of L5 on S1. Electronically Signed   By: Van Clines M.D.   On: 09/12/2016 16:55    Assessment & Plan    1. Sinus Bradycardia - presented after a fall at home in which she reported weakness along her lower extremities. Reported hitting her head but denied a loss of consciousness. No prodromal symptoms noted. - HR has been variable in 40's - 70's while in the ED. No significant pauses on telemetry. Went back into atrial fibrillation with RVR yesterday. Possible tachybrady syndrome but we do not have any documented strips with significant bradycardia.  Appreciated EP input.  No indication for  PPM at this time unless we can correlate patient's symptoms with bradycardia.  Would benefit from 30 day heart monitor at discharge which we will set up.  Will set up OV in afib clinic in 3-4 weeks.  Cardizem CD stopped.    2. Hypotension - BP has been normal to elevated over the past 24 hours.  - Restarted Lisinopril 10mg  yesterday.  Will increase to 20mg  daily which was her home dose.   3. Paroxysmal Atrial Fibrillation -  No further afib since potassium repleted.  May have been triggered by hypokalemia.  Need to keep K > 4. - This patients CHA2DS2-VASc Score and unadjusted Ischemic Stroke Rate (% per year) is equal to 7.2 % stroke rate/year from a score of 5 (CHF, HTN, Female, Age (2)). Continue Eliquis for anticoagulation. Reduced dosing in setting of weight of 46 kg and  age of 44. - Cardizem long acting has been stopped and using short acting for breakthrough. - ? Whether hypokalemia is triggering her afib.  Once potassium has been consistently > 4, if the continues to have PAF, may need to consider AAD with Amio or Tikosyn.  Cannot use Tikosyn in setting of persistent hypo K and hypo Mag. - appreciate EP input - will followup in afib clinic in 3-4 weeks.  4. Alcohol Use - consumes 3-5 oz of liquor per day. - currently on CIWA protocol. Per admitting team  5. Tobacco Use - cessation advised.  6.  Hypokalemia - repleted.  ? Etiology.  She is not on diuretic therapy.  She has not had diarrhea in some time.  She has had poor PO intake recently.  ? Primary renal issue with potassium wasting.  Consider renal consult.   7.  Weakness and difficulty walking ? Upper motor neuron dysfunction - Neuro following.   Fransico Him, MD  09/15/2016  10:14 AM

## 2016-09-15 NOTE — Progress Notes (Signed)
PROGRESS NOTE    DARRILYN HORTIN  [id number] DOB: Sep 29, 1931 DOA: 09/11/2016 PCP: Mayra Neer, MD   Outpatient Specialists:     Brief Narrative:  Lauren Maxwell is a 82 y.o. female with a Past Medical History of depression, anxiety, AAA, follicular lymphoma, HTN, smoking who presents with symptomatically bradycardia and hypotension in the setting of mild metabolic derangements with failure to thrive in the setting of continued chronic alcohol use and abuse. Neuro and cardiology following.  A fib thought to be related to hypokalemia.  This has been corrected.  Needs SNF and once outpatient get EMG with Neuro-- ? ALS diagnosis.     Assessment & Plan:   Principal Problem:   Bradycardia Active Problems:   Smoking   Chronic diastolic heart failure (HCC)   Coronary artery disease involving native coronary artery of native heart without angina pectoris   Anticoagulation therapy not indicated   Generalized weakness   Hypotension   Hypokalemia   ETOH abuse   Hyponatremia   Protein-calorie malnutrition, severe   Tachy/Brady syndrome? -cards/EP following -PRN diltiazem -TSH ok -to follow up outpatient with EP-- ? 30 day event monitor  Hypokalemia./hypomagnesium -replete to 4 for K and > 2 for Mg- recheck in AM -suspect difficult to replete due to patient not eating-- 0% of meals is what is documented-- expanded diet to regular -if not better in the AM, will get nephro consult  Acute Chronic diastolic heart failure. Appears compensated. Home medications include lisinopril. Echo done last year reveals a mild LVH with an EF of 60% and grade 1 diastolic dysfunction -lV lasix x 1 given for small pleural effusions   Hyponatremia. Likely related to decreased oral intake in the setting of EtOH use plus fluid overload Recheck in AM  EtOH use. Patient reports 1-2 drinks a day. No signs symptoms of withdrawals on admission -CIWA protocol -Monitor  CAD. She denies chest  pain. EKG without acute changes. -Monitor. -See #1  Tobacco use. -Cessation counseling offered  Generalized weakness/failure to thrive/frequent falls. In the setting of chronic anxiety/depression and ETOH use. No sign of infectious process. Mild metabolic derangements. -MRI c-spine, lumbar spine and thoracic spine- lots of issues-- defer to neuro--  MRI brain with chronic changes and will need outpatient EMG -B1 pending-- thiamine added -PT consult- SNF  Severe malnutrition in context of chronic illness  DVT prophylaxis:  Fully anticoagulated   Code Status: Full Code   Family Communication: Patient and son 10/11  Disposition Plan:  SNF if patient agreeable?   Consultants:   Cards  EP  neuro    Subjective: Starting to eat better K improved today +cough-- clear mucus  Objective: Vitals:   09/14/16 2008 09/15/16 0211 09/15/16 0615 09/15/16 1314  BP: (!) 148/88 138/74 (!) 142/75 137/83  Pulse: 89 83 83 68  Resp: 18 18 18 17   Temp: 98.1 F (36.7 C) 97.8 F (36.6 C) 97.7 F (36.5 C) 98.1 F (36.7 C)  TempSrc: Oral Oral Oral Oral  SpO2: 93% 96% 96% 95%  Weight:      Height:        Intake/Output Summary (Last 24 hours) at 09/15/16 1329 Last data filed at 09/15/16 1300  Gross per 24 hour  Intake              720 ml  Output              200 ml  Net  520 ml   Filed Weights   09/11/16 0955 09/14/16 0335  Weight: 46.3 kg (102 lb) 43.9 kg (96 lb 12.8 oz)    Examination:  General exam: frail appearing Respiratory system: diminshed at bases Cardiovascular system: irr and fast, No pedal edema. Gastrointestinal system: Abdomen is nondistended, soft and nontender. No organomegaly or masses felt. Normal bowel sounds heard. Central nervous system: Alert and oriented. No focal neurological deficits. Extremities: weak      Data Reviewed: I have personally reviewed following labs and imaging studies  CBC:  Recent Labs Lab 09/11/16 1200  09/13/16 0410 09/14/16 0325  WBC 8.7 7.0 7.9  HGB 15.5* 14.0 14.4  HCT 44.4 41.1 41.1  MCV 95.5 97.6 95.4  PLT 216 197 0000000   Basic Metabolic Panel:  Recent Labs Lab 09/11/16 1200 09/12/16 1328 09/13/16 0410 09/14/16 0325 09/15/16 0239  NA 136 132* 132* 133* 131*  K 3.4* 3.0* 3.3* 3.3* 4.3  CL 99* 97* 99* 96* 98*  CO2 25 24 24 26 25   GLUCOSE 90 121* 104* 97 96  BUN 6 6 <5* 5* 6  CREATININE 0.66 0.61 0.51 0.55 0.52  CALCIUM 9.5 9.3 9.0 9.1 9.0  MG  --   --  1.5* 1.9 2.0   GFR: Estimated Creatinine Clearance: 37.6 mL/min (by C-G formula based on SCr of 0.52 mg/dL). Liver Function Tests:  Recent Labs Lab 09/12/16 1328 09/13/16 0410  AST 33 27  ALT 22 21  ALKPHOS 63 59  BILITOT 1.9* 1.4*  PROT 5.7* 5.3*  ALBUMIN 3.8 3.3*   No results for input(s): LIPASE, AMYLASE in the last 168 hours. No results for input(s): AMMONIA in the last 168 hours. Coagulation Profile: No results for input(s): INR, PROTIME in the last 168 hours. Cardiac Enzymes: No results for input(s): CKTOTAL, CKMB, CKMBINDEX, TROPONINI in the last 168 hours. BNP (last 3 results) No results for input(s): PROBNP in the last 8760 hours. HbA1C: No results for input(s): HGBA1C in the last 72 hours. CBG: No results for input(s): GLUCAP in the last 168 hours. Lipid Profile: No results for input(s): CHOL, HDL, LDLCALC, TRIG, CHOLHDL, LDLDIRECT in the last 72 hours. Thyroid Function Tests: No results for input(s): TSH, T4TOTAL, FREET4, T3FREE, THYROIDAB in the last 72 hours. Anemia Panel: No results for input(s): VITAMINB12, FOLATE, FERRITIN, TIBC, IRON, RETICCTPCT in the last 72 hours. Urine analysis:    Component Value Date/Time   COLORURINE YELLOW 09/11/2016 [place] 09/11/2016 1117   LABSPEC <1.005 (L) 09/11/2016 1117   PHURINE 6.0 09/11/2016 1117   GLUCOSEU NEGATIVE 09/11/2016 1117   HGBUR SMALL (A) 09/11/2016 1117   BILIRUBINUR NEGATIVE 09/11/2016 1117   KETONESUR 15 (A)  09/11/2016 1117   PROTEINUR NEGATIVE 09/11/2016 1117   NITRITE NEGATIVE 09/11/2016 1117   LEUKOCYTESUR TRACE (A) 09/11/2016 1117     )No results found for this or any previous visit (from the past 240 hour(s)).    Anti-infectives    None       Radiology Studies: Dg Chest 2 View  Result Date: 09/15/2016 CLINICAL DATA:  Cough, history of COPD EXAM: CHEST  2 VIEW COMPARISON:  05/22/15 FINDINGS: Cardiac shadow is mildly enlarged but stable. Aortic calcifications and tortuosity it are again seen and stable. The lungs are hyperinflated consistent with COPD. Small bilateral pleural effusions are noted left greater than right new from the prior exam. No focal infiltrate is seen. Degenerative changes of the thoracic spine are noted as well as some compression deformity  at T3 stable from the recent MRI. Stable T5 and T6 compression deformities are noted as well IMPRESSION: COPD. Bilateral pleural effusions left greater than right without definitive infiltrate. Electronically Signed   By: Inez Catalina M.D.   On: 09/15/2016 09:46   Mr Brain Wo Contrast  Result Date: 09/13/2016 CLINICAL DATA:  Slurred speech and difficulty walking. EXAM: MRI HEAD WITHOUT CONTRAST TECHNIQUE: Multiplanar, multiecho pulse sequences of the brain and surrounding structures were obtained without intravenous contrast. COMPARISON:  Head CT 09/11/2016 FINDINGS: Brain: No acute infarct or intraparenchymal hemorrhage. The midline structures are normal. There is beginning confluent hyperintense T2-weighted signal within the periventricular and deep white matter, most often seen in the setting of chronic microvascular ischemia. No mass lesion or midline shift. No hydrocephalus or extra-axial fluid collection. Vascular: Major intracranial arterial and venous sinus flow voids are preserved. No evidence of chronic microhemorrhage or amyloid angiopathy. Skull and upper cervical spine: The visualized skull base, calvarium, upper cervical  spine and extracranial soft tissues are normal. Sinuses/Orbits: No fluid levels or advanced mucosal thickening. No mastoid effusion. Normal orbits. IMPRESSION: 1. No acute intracranial abnormality. 2. Chronic microvascular ischemic disease. Electronically Signed   By: Ulyses Jarred M.D.   On: 09/13/2016 21:41        Scheduled Meds: . apixaban  2.5 mg Oral BID  . feeding supplement  1 Container Oral TID BM  . folic acid  1 mg Oral Daily  . Influenza vac split quadrivalent PF  0.5 mL Intramuscular Tomorrow-1000  . lisinopril  20 mg Oral Daily  . multivitamin with minerals  1 tablet Oral Daily  . potassium chloride  40 mEq Oral Daily  . sodium chloride flush  3 mL Intravenous Q12H  . thiamine  100 mg Oral Daily   Or  . thiamine  100 mg Intravenous Daily   Continuous Infusions:    LOS: 1 day    Time spent: 25 min    [place], DO [facility] Pager [phone]  If 7PM-7AM, please contact night-coverage [email or web address] Password [email or web address] 09/15/2016, 1:29 PM

## 2016-09-16 ENCOUNTER — Other Ambulatory Visit: Payer: Self-pay | Admitting: Physician Assistant

## 2016-09-16 DIAGNOSIS — Z9181 History of falling: Secondary | ICD-10-CM

## 2016-09-16 DIAGNOSIS — I480 Paroxysmal atrial fibrillation: Secondary | ICD-10-CM

## 2016-09-16 DIAGNOSIS — R5383 Other fatigue: Secondary | ICD-10-CM

## 2016-09-16 DIAGNOSIS — I951 Orthostatic hypotension: Secondary | ICD-10-CM

## 2016-09-16 DIAGNOSIS — I498 Other specified cardiac arrhythmias: Secondary | ICD-10-CM

## 2016-09-16 LAB — BASIC METABOLIC PANEL
ANION GAP: 11 (ref 5–15)
BUN: 10 mg/dL (ref 6–20)
CALCIUM: 9.6 mg/dL (ref 8.9–10.3)
CHLORIDE: 95 mmol/L — AB (ref 101–111)
CO2: 24 mmol/L (ref 22–32)
Creatinine, Ser: 0.64 mg/dL (ref 0.44–1.00)
GFR calc non Af Amer: >60 mL/min (ref >60)
Glucose, Bld: 91 mg/dL (ref 65–99)
POTASSIUM: 4.6 mmol/L (ref 3.5–5.1)
Sodium: 130 mmol/L — ABNORMAL LOW (ref 135–145)

## 2016-09-16 LAB — CBC
HEMATOCRIT: 43.2 % (ref 36.0–46.0)
Hemoglobin: 15.1 g/dL — ABNORMAL HIGH (ref 12.0–15.0)
MCH: 33.3 pg (ref 26.0–34.0)
MCHC: 35 g/dL (ref 30.0–36.0)
MCV: 95.2 fL (ref 78.0–100.0)
Platelets: 230 10*3/uL (ref 150–400)
RBC: 4.54 MIL/uL (ref 3.87–5.11)
RDW: 12.7 % (ref 11.5–15.5)
WBC: 6.4 10*3/uL (ref 4.0–10.5)

## 2016-09-16 LAB — MAGNESIUM: Magnesium: 1.8 mg/dL (ref 1.7–2.4)

## 2016-09-16 MED ORDER — DILTIAZEM HCL 30 MG PO TABS: 30.0000 mg | ORAL_TABLET | Freq: Four times a day (QID) | ORAL | Status: DC

## 2016-09-16 MED ORDER — DILTIAZEM HCL 30 MG PO TABS: 30.0000 mg | ORAL_TABLET | Freq: Four times a day (QID) | ORAL | 0 refills | Status: DC | PRN

## 2016-09-16 MED ORDER — THIAMINE HCL 100 MG PO TABS: 100.0000 mg | ORAL_TABLET | Freq: Every day | ORAL | 0 refills | Status: DC

## 2016-09-16 MED ORDER — DILTIAZEM HCL 30 MG PO TABS
30.0000 mg | ORAL_TABLET | Freq: Four times a day (QID) | ORAL | Status: DC
  Administered 2016-09-16 – 2016-09-17 (×2): 30 mg via ORAL
  Filled 2016-09-16 (×2): qty 1

## 2016-09-16 MED ORDER — MAGNESIUM SULFATE 2 GM/50ML IV SOLN
2.0000 g | Freq: Once | INTRAVENOUS | Status: AC
  Administered 2016-09-16: 2 g via INTRAVENOUS
  Filled 2016-09-16: qty 50

## 2016-09-16 MED ORDER — DILTIAZEM HCL 30 MG PO TABS
30.0000 mg | ORAL_TABLET | Freq: Four times a day (QID) | ORAL | Status: DC
Start: 2016-09-16 — End: 2016-09-16

## 2016-09-16 MED ORDER — DILTIAZEM HCL 60 MG PO TABS
60.0000 mg | ORAL_TABLET | Freq: Four times a day (QID) | ORAL | Status: DC
  Administered 2016-09-16: 60 mg via ORAL
  Filled 2016-09-16: qty 1

## 2016-09-16 NOTE — Progress Notes (Signed)
Patient noted to be in afib with HR in the 150's, patient stated that she felt her heart racing and was anxious, BP 113/74, MD notified, new orders received will continue to monitor

## 2016-09-16 NOTE — Discharge Summary (Addendum)
Physician Discharge Summary  Lauren Maxwell [id number] DOB: [birth year] DOA: 09/11/2016  PCP: Mayra Neer, MD  Admit date: 09/11/2016 Discharge date: 09/16/2016  Admitted From: Home Disposition:  SNF  Recommendations for Outpatient Follow-up:  1. Follow up with PCP in 1-2 weeks 2. Please obtain BMP/CBC on 09/19/16  ADDENDUM--patient not discharged until 09/17/16   Discharge Condition: stable CODE STATUS: FULL Diet recommendation: Heart Healthy  Brief/Interim Summary: 82 year old female with a history of depression, lymphoma, hypertension, tobacco and alcohol use, chronic diastolic CHF,  CAD noted on CT in 2014, dilated ascending aorta, and atrial fibrillation presented to [facility] on 09/11/2016 for evaluation of generalized weakness, dizziness, and fall. She reports walking around her home when she fell while using her walker. Developed weakness along her lower extremities and fell backwards.  Reported hitting her head but denied a loss of consciousness. She has been experiencing weakness for several months and recently was diagnosed with a foraminal stenosis and a herniated disc in the cervical spine.  No prior cardiac workup for CAD besides being noted on CT Imaging in 2014. No recent chest discomfort, palpitations, or dyspnea with exertion.   Prior to admission medications include Cartia XT 120mg  daily, Eliquis 2.5mg  BID, and Lisinopril 20mg  BID.  While in the ED, she has been noted to be bradycardiac in the 40's. HR is currently in the 70's. No significant pauses are noted on telemetry. Labs show a WBC of 8.7, Hgb 15.5, and platelets 216. K+ 3.4 and creatinine 0.66. Ethanol < 5. TSH 2.227. CT Head on admission was without acute intracranial abnormalities. EKG shows NSR, HR 67, LAD, with no acute ST or T-wave changes.   Does consume between 3-5 oz of liquor per day. Has a 31 pack-year tobacco history. No known recreational drug use.   Discharge Diagnoses:  Sinus  bradycardia -Appreciate cardiology consultation and follow-up - HR has been variable in 40's - 70's while in the ED. No significant pauses on telemetry. In and out of afib during admission.  Possible tachybrady syndrome but we do not have any documented strips with significant bradycardia.  Appreciated EP input.  No indication for  PPM at this time unless we can correlate patient's symptoms with bradycardia.  Would benefit from 30 day heart monitor at discharge which we will set up.  Cardiology Will set up OV in afib clinic in 3-4 weeks.    Hypotension -likely due to volume depletion  -resolved - BP has been normal over the past 24 hours after increasing Lisinopril to 20mg  daily.   Paroxysmal Atrial Fibrillation -  No further afib since potassium repleted.  May have been triggered by hypokalemia.  Need to keep K > 4. - This patients CHA2DS2-VASc Score and unadjusted Ischemic Stroke Rate (% per year) is equal to 7.2 % stroke rate/year from a score of 5 (CHF, HTN, Female, Age (2)). Continue Eliquis for anticoagulation. Reduced dosing in setting of weight of 46 kg and age of 82. - Cardizem long acting has been stopped and using short acting for breakthrough. - ? Whether hypokalemia is triggering her afib.  Once potassium has been consistently > 4, if the continues to have PAF, may need to consider AAD with Amio or Tikosyn.  Cannot use Tikosyn in setting of persistent hypo K and hypo Mag.  No further PAF since K has been repleted. - appreciate EP input - cardiology will followup in afib clinic in 3-4 weeks.  Generalized weakness/failure to thrive/frequent falls. In the setting of chronic anxiety/depression  and ETOH use.  -No sign of infectious process. Mild metabolic derangement which have been corrected -MRI c-spine--severe bilateral stenosis secondary to disc bulge C4-5, C5-6 -Case discussed with neurosurgery, Dr. Cyndy Freeze on 10/13--findings do not explain patient's symptoms, no cord impingement;  continue non-surgical management -lumbar spine MR--low-level perimuscular edema, mild L4-5, L5-S1 impingement and stenosis, anterolisthesis L5 on S1 -MRI brain with chronic changes and possible outpatient EMG--refer to Dr. Narda Amber, neuromuscular specialist -B1 and B12-- normal -TSH--2.227 -PT consult--->SNF -?Etoh myopathy  Hypokalemia/hypomagnesemia/hyponatremia -Likely due to poor oral intake, poor solute intake in the setting of chronic alcohol use suspect patient also has underlying alcoholic liver disease contributing to hyponatremia -Sodium level has been stable 130-133 -Continue daily potassium supplementation--discontinue as K trending high and K is improving since starting lisinopril -Recheck potassium 09/19/2016   Alcohol abuse -Checks 4-6 ounces of vodka daily for the past 30 years -No signs of withdrawal  Tobacco use -Tobacco cessation discussed  Chronic diastolic CHF -Clinically compensated -09/15/2016 echo EF 55-60%, grade 1 DD, mild MR  Discharge Instructions  Discharge Instructions    Ambulatory referral to Neurology    Complete by:  As directed    Refer to [facility]   Call son, Lexys Krider for appointment [phone]   Diet - low sodium heart healthy    Complete by:  As directed    Increase activity slowly    Complete by:  As directed        Medication List    STOP taking these medications   CARTIA XT 120 MG 24 hr capsule Generic drug:  diltiazem     TAKE these medications   diltiazem 30 MG tablet Commonly known as:  CARDIZEM Take 1 tablet (30 mg total) by mouth every 6 (six) hours as needed (HR > 100).   ELIQUIS 2.5 MG Tabs tablet Generic drug:  apixaban take 1 tablet by mouth twice a day   lisinopril 20 MG tablet Commonly known as:  PRINIVIL,ZESTRIL Take 20 mg by mouth 2 (two) times daily.   multivitamin tablet Take 1 tablet by mouth every other day. Centrum Silver   sodium chloride 0.65 % nasal spray Commonly known as:   OCEAN Place 1 spray into the nose as needed. For nasal congestion.   thiamine 100 MG tablet Take 1 tablet (100 mg total) by mouth daily.       Allergies  Allergen Reactions  . Codeine Nausea And Vomiting    Consultations:  Neurology  Cardiology  Neurosurgery--Ditty on phone   Procedures/Studies: Dg Chest 2 View  Result Date: 09/15/2016 CLINICAL DATA:  Cough, history of COPD EXAM: CHEST  2 VIEW COMPARISON:  05/22/15 FINDINGS: Cardiac shadow is mildly enlarged but stable. Aortic calcifications and tortuosity it are again seen and stable. The lungs are hyperinflated consistent with COPD. Small bilateral pleural effusions are noted left greater than right new from the prior exam. No focal infiltrate is seen. Degenerative changes of the thoracic spine are noted as well as some compression deformity at T3 stable from the recent MRI. Stable T5 and T6 compression deformities are noted as well IMPRESSION: COPD. Bilateral pleural effusions left greater than right without definitive infiltrate. Electronically Signed   By: Inez Catalina M.D.   On: 09/15/2016 09:46   Ct Head Wo Contrast  Result Date: 09/11/2016 CLINICAL DATA:  Pain after fall. EXAM: CT HEAD WITHOUT CONTRAST TECHNIQUE: Contiguous axial images were obtained from the base of the skull through the vertex without intravenous contrast. COMPARISON:  Apr 25, 2016 FINDINGS: Brain: No subdural, epidural, or subarachnoid hemorrhage. Ventricles and sulci are stable. Cerebellum, brainstem, and basal cisterns are normal. No acute cortical ischemia or infarct. Moderate white matter changes again identified. No mass, mass effect, or midline shift. Vascular: Atherosclerotic changes seen in the intracranial carotid arteries. Skull: Normal. Negative for fracture or focal lesion. Sinuses/Orbits: No acute finding. Other: No other abnormalities. IMPRESSION: No acute intracranial abnormality. Electronically Signed   By: Dorise Bullion III M.D   On:  09/11/2016 11:00   Mr Brain Wo Contrast  Result Date: 09/13/2016 CLINICAL DATA:  Slurred speech and difficulty walking. EXAM: MRI HEAD WITHOUT CONTRAST TECHNIQUE: Multiplanar, multiecho pulse sequences of the brain and surrounding structures were obtained without intravenous contrast. COMPARISON:  Head CT 09/11/2016 FINDINGS: Brain: No acute infarct or intraparenchymal hemorrhage. The midline structures are normal. There is beginning confluent hyperintense T2-weighted signal within the periventricular and deep white matter, most often seen in the setting of chronic microvascular ischemia. No mass lesion or midline shift. No hydrocephalus or extra-axial fluid collection. Vascular: Major intracranial arterial and venous sinus flow voids are preserved. No evidence of chronic microhemorrhage or amyloid angiopathy. Skull and upper cervical spine: The visualized skull base, calvarium, upper cervical spine and extracranial soft tissues are normal. Sinuses/Orbits: No fluid levels or advanced mucosal thickening. No mastoid effusion. Normal orbits. IMPRESSION: 1. No acute intracranial abnormality. 2. Chronic microvascular ischemic disease. Electronically Signed   By: Ulyses Jarred M.D.   On: 09/13/2016 21:41   Mr Cervical Spine Wo Contrast  Result Date: 09/12/2016 CLINICAL DATA:  Initial evaluation for generalized weakness with tear earlier disc thrive, frequent falls. EXAM: MRI CERVICAL SPINE WITHOUT CONTRAST TECHNIQUE: Multiplanar, multisequence MR imaging of the cervical spine was performed. No intravenous contrast was administered. COMPARISON:  None available. FINDINGS: Alignment: Vertebral bodies normally aligned with preservation of the normal cervical lordosis. No listhesis or subluxation. Vertebrae: Vertebral body heights preserved. No evidence for acute or chronic fracture. Signal intensity within the vertebral body bone marrow is normal. No worrisome osseous lesions. No marrow edema. Cord: Signal intensity  within the cervical spinal cord is normal. Posterior Fossa, vertebral arteries, paraspinal tissues: Visualized portions of the brain and posterior fossa are unremarkable. Craniocervical junction within normal limits. Paraspinous and prevertebral soft tissues within normal limits. Normal intravascular flow voids present within the vertebral arteries bilaterally. Disc levels: C2-C3: Mild bilateral uncovertebral hypertrophy without significant stenosis. C3-C4: Mild diffuse disc bulge with disc desiccation and intervertebral disc space narrowing. Bulging disc flattens the ventral thecal sac and results in mild canal stenosis. Superimposed left-sided uncovertebral hypertrophy with resultant mild left foraminal narrowing. C4-C5: Diffuse degenerative disc bulge with intervertebral disc space narrowing and disc desiccation. Bilateral uncovertebral hypertrophy. Posterior disc bulge flattens and partially faces the ventral thecal sac results in mild canal stenosis. Resultant moderate to severe bilateral foraminal stenosis, slightly worse on the left. C5-C6: Mild diffuse degenerative disc bulge with disc desiccation and intervertebral disc space narrowing. Left greater than right uncovertebral hypertrophy. Resultant severe bilateral foraminal stenosis, left greater than right. Bulging disc flattens the ventral thecal sac and results in mild canal stenosis. C6-C7: Diffuse degenerative disc bulge with disc desiccation and intervertebral disc space narrowing. Left greater than right uncovertebral hypertrophy. Moderate left with mild right foraminal stenosis. Superimposed ligamentum flavum hypertrophy at this level. Resultant moderate canal stenosis. C7-T1: Mild diffuse disc bulge with uncovertebral hypertrophy. Mild left foraminal narrowing. No significant right foraminal stenosis. Central canal widely patent. Partially visualized upper thoracic spine unremarkable  without significant stenosis or degenerative changes. IMPRESSION:  1. No evidence for acute traumatic injury within the cervical spine. 2. Moderate multilevel degenerative spondylolysis with mild to moderate diffuse canal narrowing extending from C3-4 through C6-7, greatest at the C6-7 level. 3. Severe bilateral foraminal stenosis due to disc bulge and uncovertebral disease at C4-5 and C5-6, with moderate left foraminal narrowing at C6-7. Electronically Signed   By: Jeannine Boga M.D.   On: 09/12/2016 16:15   Mr Thoracic Spine Wo Contrast  Result Date: 09/12/2016 CLINICAL DATA:  Persistent mid and low back pain. Multiple recent falls. EXAM: MRI THORACIC SPINE WITHOUT CONTRAST TECHNIQUE: Multiplanar, multisequence MR imaging of the thoracic spine was performed. No intravenous contrast was administered. COMPARISON:  Radiographs dated 07/07/2016 and lumbar MRI dated 06/25/2016 and chest CT dated 05/15/2013 FINDINGS: Alignment: There is accentuation of the thoracic kyphosis. No subluxation. Vertebrae: There is a subacute subtle benign-appearing compression fracture of the T3 vertebral body with slight residual edema in the vertebra. No protrusion of bone or disc material into the spinal canal. No bone destruction. There are old slight anterior wedge deformities of T5 and T6, unchanged since the prior CT scan. Degenerative changes of the vertebral endplates at [id number], chronic. Cord:  Normal.  Conus tip at T12-L1. Paraspinal and other soft tissues: There is a small left pleural effusion and a tiny right effusion. There is aneurysmal dilatation of the arch of the aorta to a diameter of 4.1 cm at the top of the ascending aorta. Aortic atherosclerosis. Disc levels: The discs throughout the thoracic spine demonstrate no protrusion or bulging. No foraminal or spinal stenosis. IMPRESSION: 1. Subacute mild benign-appearing compression fracture of the T3 vertebral body. No neural impingement. 2. Old compression deformities of T5 and T6, stable. 3. Small left pleural effusion. 4. Slight  aneurysmal dilatation of the top of the ascending thoracic aorta to a diameter of 4.1 cm. 5. Aortic atherosclerosis. Electronically Signed   By: Lorriane Shire M.D.   On: 09/12/2016 16:43   Mr Lumbar Spine Wo Contrast  Result Date: 09/12/2016 CLINICAL DATA:  History of falls.  Failure to thrive. EXAM: MRI LUMBAR SPINE WITHOUT CONTRAST TECHNIQUE: Multiplanar, multisequence MR imaging of the lumbar spine was performed. No intravenous contrast was administered. COMPARISON:  06/25/2016; 07/07/2016 FINDINGS: Despite efforts by the technologist and patient, severe motion artifact is present on today's exam and could not be eliminated. This reduces exam sensitivity and specificity. Segmentation: The lowest lumbar type non-rib-bearing vertebra is labeled as L5. Alignment: 8 mm anterolisthesis at L5-S1 associated with bilateral L5 pars defects. Vertebrae: Type 1 degenerative endplate findings eccentric to the right at L5-S1, fairly similar to the prior exam. Remote inferior endplate compressions at L5 and L2, no change from prior. There is some subtle lower sacral or sacrococcygeal edema and low-level presacral edema does not visibly present previously, then the possibility of a subtle fracture in the sacrococcygeal region is raised, although given the degree of motion artifact, I am not entirely certain. Conus medullaris: Extends to the T12-L1 level and appears normal. Paraspinal and other soft tissues: On inversion recovery weighted images there is some equivocal edema in the paraspinal musculature of the lumbar spine. Disc levels: L1-2:  Unremarkable. L2-3:  No definite impingement. L3-4:  No definite impingement. L4-5: Stable mild left foraminal stenosis due to disc uncovering and left facet arthropathy. L5-S1: Stable mild right and borderline left foraminal stenosis due to disc uncovering and facet arthropathy. IMPRESSION: 1. Subtle edema in the sacrococcygeal region could  reflect a nondisplaced fracture. Motion  artifact makes this finding not entirely certain. 2. Low-level paraspinal muscular edema, possible muscular strain. 3. No new compression fracture in the lumbar spine is identified. Old inferior endplate compressions at L5 and L2. 4. Stable mild impingement at L4-5 and L5-S1. 5. Stable appearance of bilateral pars defects at L5 with 8 mm anterolisthesis of L5 on S1. Electronically Signed   By: Van Clines M.D.   On: 09/12/2016 16:55        Discharge Exam: Vitals:   09/15/16 1314 09/16/16 0637  BP: 137/83 117/85  Pulse: 68 84  Resp: 17 16  Temp: 98.1 F (36.7 C) 98.4 F (36.9 C)   Vitals:   09/15/16 0211 09/15/16 0615 09/15/16 1314 09/16/16 0637  BP: 138/74 (!) 142/75 137/83 117/85  Pulse: 83 83 68 84  Resp: 18 18 17 16   Temp: 97.8 F (36.6 C) 97.7 F (36.5 C) 98.1 F (36.7 C) 98.4 F (36.9 C)  TempSrc: Oral Oral Oral Oral  SpO2: 96% 96% 95% 100%  Weight:      Height:        General: Pt is alert, awake, not in acute distress Cardiovascular: RRR, S1/S2 +, no rubs, no gallops Respiratory: Diminished breath sounds but clear to auscultation. No wheezing.  Abdominal: Soft, NT, ND, bowel sounds + Extremities: no edema, no cyanosis   The results of significant diagnostics from this hospitalization (including imaging, microbiology, ancillary and laboratory) are listed below for reference.    Significant Diagnostic Studies: Dg Chest 2 View  Result Date: 09/15/2016 CLINICAL DATA:  Cough, history of COPD EXAM: CHEST  2 VIEW COMPARISON:  05/22/15 FINDINGS: Cardiac shadow is mildly enlarged but stable. Aortic calcifications and tortuosity it are again seen and stable. The lungs are hyperinflated consistent with COPD. Small bilateral pleural effusions are noted left greater than right new from the prior exam. No focal infiltrate is seen. Degenerative changes of the thoracic spine are noted as well as some compression deformity at T3 stable from the recent MRI. Stable T5 and T6  compression deformities are noted as well IMPRESSION: COPD. Bilateral pleural effusions left greater than right without definitive infiltrate. Electronically Signed   By: Inez Catalina M.D.   On: 09/15/2016 09:46   Ct Head Wo Contrast  Result Date: 09/11/2016 CLINICAL DATA:  Pain after fall. EXAM: CT HEAD WITHOUT CONTRAST TECHNIQUE: Contiguous axial images were obtained from the base of the skull through the vertex without intravenous contrast. COMPARISON:  Apr 25, 2016 FINDINGS: Brain: No subdural, epidural, or subarachnoid hemorrhage. Ventricles and sulci are stable. Cerebellum, brainstem, and basal cisterns are normal. No acute cortical ischemia or infarct. Moderate white matter changes again identified. No mass, mass effect, or midline shift. Vascular: Atherosclerotic changes seen in the intracranial carotid arteries. Skull: Normal. Negative for fracture or focal lesion. Sinuses/Orbits: No acute finding. Other: No other abnormalities. IMPRESSION: No acute intracranial abnormality. Electronically Signed   By: Dorise Bullion III M.D   On: 09/11/2016 11:00   Mr Brain Wo Contrast  Result Date: 09/13/2016 CLINICAL DATA:  Slurred speech and difficulty walking. EXAM: MRI HEAD WITHOUT CONTRAST TECHNIQUE: Multiplanar, multiecho pulse sequences of the brain and surrounding structures were obtained without intravenous contrast. COMPARISON:  Head CT 09/11/2016 FINDINGS: Brain: No acute infarct or intraparenchymal hemorrhage. The midline structures are normal. There is beginning confluent hyperintense T2-weighted signal within the periventricular and deep white matter, most often seen in the setting of chronic microvascular ischemia. No mass lesion or  midline shift. No hydrocephalus or extra-axial fluid collection. Vascular: Major intracranial arterial and venous sinus flow voids are preserved. No evidence of chronic microhemorrhage or amyloid angiopathy. Skull and upper cervical spine: The visualized skull base,  calvarium, upper cervical spine and extracranial soft tissues are normal. Sinuses/Orbits: No fluid levels or advanced mucosal thickening. No mastoid effusion. Normal orbits. IMPRESSION: 1. No acute intracranial abnormality. 2. Chronic microvascular ischemic disease. Electronically Signed   By: Ulyses Jarred M.D.   On: 09/13/2016 21:41   Mr Cervical Spine Wo Contrast  Result Date: 09/12/2016 CLINICAL DATA:  Initial evaluation for generalized weakness with tear earlier disc thrive, frequent falls. EXAM: MRI CERVICAL SPINE WITHOUT CONTRAST TECHNIQUE: Multiplanar, multisequence MR imaging of the cervical spine was performed. No intravenous contrast was administered. COMPARISON:  None available. FINDINGS: Alignment: Vertebral bodies normally aligned with preservation of the normal cervical lordosis. No listhesis or subluxation. Vertebrae: Vertebral body heights preserved. No evidence for acute or chronic fracture. Signal intensity within the vertebral body bone marrow is normal. No worrisome osseous lesions. No marrow edema. Cord: Signal intensity within the cervical spinal cord is normal. Posterior Fossa, vertebral arteries, paraspinal tissues: Visualized portions of the brain and posterior fossa are unremarkable. Craniocervical junction within normal limits. Paraspinous and prevertebral soft tissues within normal limits. Normal intravascular flow voids present within the vertebral arteries bilaterally. Disc levels: C2-C3: Mild bilateral uncovertebral hypertrophy without significant stenosis. C3-C4: Mild diffuse disc bulge with disc desiccation and intervertebral disc space narrowing. Bulging disc flattens the ventral thecal sac and results in mild canal stenosis. Superimposed left-sided uncovertebral hypertrophy with resultant mild left foraminal narrowing. C4-C5: Diffuse degenerative disc bulge with intervertebral disc space narrowing and disc desiccation. Bilateral uncovertebral hypertrophy. Posterior disc bulge  flattens and partially faces the ventral thecal sac results in mild canal stenosis. Resultant moderate to severe bilateral foraminal stenosis, slightly worse on the left. C5-C6: Mild diffuse degenerative disc bulge with disc desiccation and intervertebral disc space narrowing. Left greater than right uncovertebral hypertrophy. Resultant severe bilateral foraminal stenosis, left greater than right. Bulging disc flattens the ventral thecal sac and results in mild canal stenosis. C6-C7: Diffuse degenerative disc bulge with disc desiccation and intervertebral disc space narrowing. Left greater than right uncovertebral hypertrophy. Moderate left with mild right foraminal stenosis. Superimposed ligamentum flavum hypertrophy at this level. Resultant moderate canal stenosis. C7-T1: Mild diffuse disc bulge with uncovertebral hypertrophy. Mild left foraminal narrowing. No significant right foraminal stenosis. Central canal widely patent. Partially visualized upper thoracic spine unremarkable without significant stenosis or degenerative changes. IMPRESSION: 1. No evidence for acute traumatic injury within the cervical spine. 2. Moderate multilevel degenerative spondylolysis with mild to moderate diffuse canal narrowing extending from C3-4 through C6-7, greatest at the C6-7 level. 3. Severe bilateral foraminal stenosis due to disc bulge and uncovertebral disease at C4-5 and C5-6, with moderate left foraminal narrowing at C6-7. Electronically Signed   By: Jeannine Boga M.D.   On: 09/12/2016 16:15   Mr Thoracic Spine Wo Contrast  Result Date: 09/12/2016 CLINICAL DATA:  Persistent mid and low back pain. Multiple recent falls. EXAM: MRI THORACIC SPINE WITHOUT CONTRAST TECHNIQUE: Multiplanar, multisequence MR imaging of the thoracic spine was performed. No intravenous contrast was administered. COMPARISON:  Radiographs dated 07/07/2016 and lumbar MRI dated 06/25/2016 and chest CT dated 05/15/2013 FINDINGS: Alignment:  There is accentuation of the thoracic kyphosis. No subluxation. Vertebrae: There is a subacute subtle benign-appearing compression fracture of the T3 vertebral body with slight residual edema in the vertebra. No  protrusion of bone or disc material into the spinal canal. No bone destruction. There are old slight anterior wedge deformities of T5 and T6, unchanged since the prior CT scan. Degenerative changes of the vertebral endplates at [id number], chronic. Cord:  Normal.  Conus tip at T12-L1. Paraspinal and other soft tissues: There is a small left pleural effusion and a tiny right effusion. There is aneurysmal dilatation of the arch of the aorta to a diameter of 4.1 cm at the top of the ascending aorta. Aortic atherosclerosis. Disc levels: The discs throughout the thoracic spine demonstrate no protrusion or bulging. No foraminal or spinal stenosis. IMPRESSION: 1. Subacute mild benign-appearing compression fracture of the T3 vertebral body. No neural impingement. 2. Old compression deformities of T5 and T6, stable. 3. Small left pleural effusion. 4. Slight aneurysmal dilatation of the top of the ascending thoracic aorta to a diameter of 4.1 cm. 5. Aortic atherosclerosis. Electronically Signed   By: Lorriane Shire M.D.   On: 09/12/2016 16:43   Mr Lumbar Spine Wo Contrast  Result Date: 09/12/2016 CLINICAL DATA:  History of falls.  Failure to thrive. EXAM: MRI LUMBAR SPINE WITHOUT CONTRAST TECHNIQUE: Multiplanar, multisequence MR imaging of the lumbar spine was performed. No intravenous contrast was administered. COMPARISON:  06/25/2016; 07/07/2016 FINDINGS: Despite efforts by the technologist and patient, severe motion artifact is present on today's exam and could not be eliminated. This reduces exam sensitivity and specificity. Segmentation: The lowest lumbar type non-rib-bearing vertebra is labeled as L5. Alignment: 8 mm anterolisthesis at L5-S1 associated with bilateral L5 pars defects. Vertebrae: Type 1 degenerative  endplate findings eccentric to the right at L5-S1, fairly similar to the prior exam. Remote inferior endplate compressions at L5 and L2, no change from prior. There is some subtle lower sacral or sacrococcygeal edema and low-level presacral edema does not visibly present previously, then the possibility of a subtle fracture in the sacrococcygeal region is raised, although given the degree of motion artifact, I am not entirely certain. Conus medullaris: Extends to the T12-L1 level and appears normal. Paraspinal and other soft tissues: On inversion recovery weighted images there is some equivocal edema in the paraspinal musculature of the lumbar spine. Disc levels: L1-2:  Unremarkable. L2-3:  No definite impingement. L3-4:  No definite impingement. L4-5: Stable mild left foraminal stenosis due to disc uncovering and left facet arthropathy. L5-S1: Stable mild right and borderline left foraminal stenosis due to disc uncovering and facet arthropathy. IMPRESSION: 1. Subtle edema in the sacrococcygeal region could reflect a nondisplaced fracture. Motion artifact makes this finding not entirely certain. 2. Low-level paraspinal muscular edema, possible muscular strain. 3. No new compression fracture in the lumbar spine is identified. Old inferior endplate compressions at L5 and L2. 4. Stable mild impingement at L4-5 and L5-S1. 5. Stable appearance of bilateral pars defects at L5 with 8 mm anterolisthesis of L5 on S1. Electronically Signed   By: Van Clines M.D.   On: 09/12/2016 16:55     Microbiology: No results found for this or any previous visit (from the past 240 hour(s)).   Labs: Basic Metabolic Panel:  Recent Labs Lab 09/12/16 1328 09/13/16 0410 09/14/16 0325 09/15/16 0239 09/16/16 0416  NA 132* 132* 133* 131* 130*  K 3.0* 3.3* 3.3* 4.3 4.6  CL 97* 99* 96* 98* 95*  CO2 24 24 26 25 24   GLUCOSE 121* 104* 97 96 91  BUN 6 <5* 5* 6 10  CREATININE 0.61 0.51 0.55 0.52 0.64  CALCIUM 9.3 9.0 9.1  9.0 9.6  MG  --  1.5* 1.9 2.0 1.8   Liver Function Tests:  Recent Labs Lab 09/12/16 1328 09/13/16 0410  AST 33 27  ALT 22 21  ALKPHOS 63 59  BILITOT 1.9* 1.4*  PROT 5.7* 5.3*  ALBUMIN 3.8 3.3*   No results for input(s): LIPASE, AMYLASE in the last 168 hours. No results for input(s): AMMONIA in the last 168 hours. CBC:  Recent Labs Lab 09/11/16 1200 09/13/16 0410 09/14/16 0325 09/16/16 0416  WBC 8.7 7.0 7.9 6.4  HGB 15.5* 14.0 14.4 15.1*  HCT 44.4 41.1 41.1 43.2  MCV 95.5 97.6 95.4 95.2  PLT 216 197 195 230   Cardiac Enzymes: No results for input(s): CKTOTAL, CKMB, CKMBINDEX, TROPONINI in the last 168 hours. BNP: Invalid input(s): POCBNP CBG: No results for input(s): GLUCAP in the last 168 hours.  Time coordinating discharge:  Greater than 30 minutes  Signed:  Denman Pichardo, DO [facility] Pager: [phone] 09/16/2016, 9:07 AM

## 2016-09-16 NOTE — Clinical Social Work Note (Signed)
Clinical Social Work Assessment  Patient Details  Name: Lauren Maxwell MRN: [id number] Date of Birth: 09/14/1931  Date of referral:  09/16/16               Reason for consult:                   Permission sought to share information with:  Facility Sport and exercise psychologist, Family Supports Permission granted to share information::  Yes, Verbal Permission Granted  Name::     Physiological scientist::  SNFs  Relationship::  Daughter  Contact Information:     Housing/Transportation Living arrangements for the past 2 months:  Single Family Home Source of Information:  Adult Children Patient Interpreter Needed:  None Criminal Activity/Legal Involvement Pertinent to Current Situation/Hospitalization:  No - Comment as needed Significant Relationships:  Adult Children Lives with:  Self Do you feel safe going back to the place where you live?  Yes Need for family participation in patient care:  Yes (Comment)  Care giving concerns:  Patient is agreeable to short term rehab and would like [facility].    Social Worker assessment / plan:  CSW spoke with patient and patient's daughter. They report patient would like to go to [facility] for rehab. CSW contacted [facility] and spoke with [place]. She reported she has a bed for patient once she is medically stable. CSW will facilitate discharge when medically stable.   Employment status:  Retired Nurse, adult PT Recommendations:  [place] / Referral to community resources:  [place]  Patient/Family's Response to care:  The patient's daughter is happy with the care the patient has received.   Patient/Family's Understanding of and Emotional Response to Diagnosis, Current Treatment, and Prognosis:The patient's daughter has a good understanding of why the patient was admitted. She understands the care plan and what the patient will need post discharge. Emotional  Assessment Appearance:   (unable to assess) Attitude/Demeanor/Rapport:  Unable to Assess Affect (typically observed):    Orientation:   (Unable to assess. ) Alcohol / Substance use:  Not Applicable Psych involvement (Current and /or in the community):  No (Comment)  Discharge Needs  Concerns to be addressed:  Discharge Planning Concerns Readmission within the last 30 days:  No Current discharge risk:  Physical Impairment Barriers to Discharge:  No Barriers Identified   Lauren Warth A Lamark Schue, LCSW 09/16/2016, 2:15 PM

## 2016-09-16 NOTE — Progress Notes (Addendum)
Called by RN that pt was feeling anxious.  Went to eval patient--pt denies cp, sob, dizziness, nausea, vomiting, palpitations -HR 130-140 -BP 113/74 CV-IRRR -Lung--diminished at bases but no wheeze or crackles  EKG--Aflutter with 2:1 block  -per cardiology and EP--start short acting diltiazem 60 mg po q 6 -if no improvement, may need to call cardiology/start amio  Total time spent with patient today (09/16/16)--40 min  DTat

## 2016-09-16 NOTE — NC FL2 (Signed)
[place] MEDICAID FL2 LEVEL OF CARE SCREENING TOOL     IDENTIFICATION  Patient Name: Lauren Maxwell Birthdate: Feb 14, 1931 Sex: female Admission Date (Current Location): 09/11/2016  [facility] and [place] Number:  Herbalist and Address:  [facility] [place] [address], [place], [place] [address]      Provider Number: [id number]  Attending Physician Name and Address:  Orson Eva, MD  Relative Name and Phone Number:       Current Level of Care: Hospital Recommended Level of Care: [place] Prior Approval Number:    Date Approved/Denied:   PASRR Number: [id number]  Discharge Plan: SNF    Current Diagnoses: Patient Active Problem List   Diagnosis Date Noted  . Protein-calorie malnutrition, severe 09/13/2016  . Bradycardia 09/12/2016  . Generalized weakness 09/12/2016  . Hypotension 09/12/2016  . Hypokalemia 09/12/2016  . ETOH abuse 09/12/2016  . Hyponatremia 09/12/2016  . Alcohol use   . Frequent falls   . Physical deconditioning   . Coronary artery calcification seen on CAT scan   . Increased MCV 05/20/2016  . Coronary artery disease involving native coronary artery of native heart without angina pectoris 09/02/2015  . Aortic dilatation ([place]) 09/02/2015  . Anticoagulation therapy not indicated 09/02/2015  . Chronic diastolic heart failure ([place]) 08/31/2015  . Atrial fibrillation with rapid ventricular response ([place]) 05/22/2015  . Lip lesion 05/19/2014  . Diarrhea 03/16/2012  . Smoking   . History of B-cell lymphoma     Orientation RESPIRATION BLADDER Height & Weight     Self, Time, Situation, Place  Normal Continent Weight: 96 lb 12.8 oz (43.9 kg) Height:  5\' 5"  (165.1 cm)  BEHAVIORAL SYMPTOMS/MOOD NEUROLOGICAL BOWEL NUTRITION STATUS   (None)  (None) Continent Diet (Regular)  AMBULATORY STATUS COMMUNICATION OF NEEDS Skin   Limited Assist Verbally Bruising, Other (Comment) (Skin tear)                        Personal Care Assistance Level of Assistance              Functional Limitations Info  Sight, Hearing, Speech Sight Info: Adequate Hearing Info: Adequate Speech Info: Adequate    SPECIAL CARE FACTORS FREQUENCY  PT (By licensed PT), Blood pressure     PT Frequency: 5 x week              Contractures Contractures Info: Not present    Additional Factors Info  Code Status, Allergies Code Status Info: Full Allergies Info: [place]           Current Medications (09/16/2016):  This is the current hospital active medication list Current Facility-Administered Medications  Medication Dose Route Frequency Provider Last Rate Last Dose  . acetaminophen (TYLENOL) tablet 650 mg  650 mg Oral Q6H PRN Radene Gunning, NP       Or  . acetaminophen (TYLENOL) suppository 650 mg  650 mg Rectal Q6H PRN Radene Gunning, NP      . antiseptic oral rinse (BIOTENE) solution 15 mL  15 mL Mouth Rinse PRN Geradine Girt, DO      . apixaban (ELIQUIS) tablet 2.5 mg  2.5 mg Oral BID Virgel Manifold, MD   2.5 mg at 09/16/16 1032  . diltiazem (CARDIZEM) tablet 30 mg  30 mg Oral Q6H PRN Geradine Girt, DO   30 mg at 09/14/16 0936  . feeding supplement (BOOST / RESOURCE BREEZE) liquid 1 Container  1  Container Oral TID BM Geradine Girt, DO   1 Container at 09/16/16 1039  . folic acid (FOLVITE) tablet 1 mg  1 mg Oral Daily Virgel Manifold, MD   1 mg at 09/16/16 1032  . Influenza vac split quadrivalent PF (FLUARIX) injection 0.5 mL  0.5 mL Intramuscular Tomorrow-1000 Virgel Manifold, MD      . lisinopril (PRINIVIL,ZESTRIL) tablet 20 mg  20 mg Oral Daily Geradine Girt, DO   20 mg at 09/16/16 1032  . LORazepam (ATIVAN) tablet 1 mg  1 mg Oral Q6H PRN Geradine Girt, DO   1 mg at 09/16/16 0116   Or  . LORazepam (ATIVAN) injection 1 mg  1 mg Intravenous Q6H PRN Geradine Girt, DO      . magnesium sulfate IVPB 2 g 50 mL  2 g Intravenous Once Orson Eva, MD      . multivitamin with minerals tablet 1 tablet  1 tablet  Oral Daily Virgel Manifold, MD   1 tablet at 09/16/16 1032  . ondansetron (ZOFRAN) tablet 4 mg  4 mg Oral Q6H PRN Radene Gunning, NP       Or  . ondansetron [facility]) injection 4 mg  4 mg Intravenous Q6H PRN Radene Gunning, NP      . senna-docusate (Senokot-S) tablet 1 tablet  1 tablet Oral QHS PRN Radene Gunning, NP   1 tablet at 09/13/16 [phone]  . sodium chloride flush (NS) 0.9 % injection 3 mL  3 mL Intravenous Q12H Radene Gunning, NP   3 mL at 09/15/16 2137  . thiamine (VITAMIN B-1) tablet 100 mg  100 mg Oral Daily Geradine Girt, DO   100 mg at 09/16/16 1032   Or  . thiamine (B-1) injection 100 mg  100 mg Intravenous Daily Geradine Girt, DO   100 mg at 09/13/16 1656     Discharge Medications: Please see discharge summary for a list of discharge medications.  Relevant Imaging Results:  Relevant Lab Results:   Additional Information SS#: [id number]  Candie Chroman, LCSW

## 2016-09-16 NOTE — Progress Notes (Signed)
Physical Therapy Treatment Patient Details Name: Lauren Maxwell MRN: [id number] DOB: [birth year] Today's Date: 09/16/2016    History of Present Illness Pt presented to the ER with fall 09/10/16. She states she was feeling weak and her right leg buckled, causing her to fall backwards, striking her had and bilateral elbows.  MRI 3 weeks ago with herniated disc but elected no surgery. Several falls the last few weeks. MRI 10/9 with new T3 compression fx, old T5,T6, L2, L5 fx PMHx: ETOH abuse, COPD, HTN, anxiety, AAA    PT Comments    Pt pleasant and willing to participate today with improved mood from prior session. Pt able to use BSC, transfer, walk and perform HEP today. Pt encouraged to continued progressing mobility and function. Educated for [employer] and OOB throughout day. Will continue to follow.  HR 93  Follow Up Recommendations  SNF;Supervision/Assistance - 24 hour     Equipment Recommendations       Recommendations for Other Services       Precautions / Restrictions Precautions Precautions: Fall Precaution Comments: right drop foot, compression fx T3    Mobility  Bed Mobility Overal bed mobility: Needs Assistance Bed Mobility: Supine to Sit     Supine to sit: Min assist     General bed mobility comments: HOB elevated per pt request with assist to move legs to EOB and rotate pelvis to edge, increased time and use of rail   Transfers Overall transfer level: Needs assistance   Transfers: Sit to/from Stand;Stand Pivot Transfers   Stand pivot transfers: Min assist Squat pivot transfers: Min assist     General transfer comment: pt able to stand from bed and pivot bed>BSC>bed>recliner with 2 additional standing trials from recliner. cues for hand placement, sequence, anterior translation, assist to control and pivot pelvis with transfers  Ambulation/Gait Ambulation/Gait assistance: Min assist Ambulation Distance (Feet): 16 Feet Assistive device: Rolling walker (2  wheeled) Gait Pattern/deviations: Trunk flexed;Step-through pattern;Decreased stance time - right;Decreased dorsiflexion - right;Narrow base of support Gait velocity: decreased Gait velocity interpretation: Below normal speed for age/gender General Gait Details: cues for posture, position in RW and encouragement to progress distance with chair to follow. Pt walked 16' then 3' (attempted to have door open for pt to exit but pt became anxious and would not ambulate further)   Stairs            Wheelchair Mobility    Modified Rankin (Stroke Patients Only)       Balance Overall balance assessment: Needs assistance   Sitting balance-Leahy Scale: Fair       Standing balance-Leahy Scale: Poor                      Cognition Arousal/Alertness: Awake/alert Behavior During Therapy: Flat affect Overall Cognitive Status: Within Functional Limits for tasks assessed                      Exercises General Exercises - Lower Extremity Long Arc Quad: AROM;Both;Seated;15 reps;AAROM (AArOM on RLE) Hip Flexion/Marching: AROM;15 reps;Both;Seated Toe Raises: AROM;Both;Seated;10 reps    General Comments        Pertinent Vitals/Pain Pain Assessment: No/denies pain    Home Living                      Prior Function            PT Goals (current goals can now be found in the care plan section)  Progress towards PT goals: Progressing toward goals    Frequency           PT Plan Current plan remains appropriate    Co-evaluation             End of Session Equipment Utilized During Treatment: Gait belt Activity Tolerance: Patient tolerated treatment well Patient left: in chair;with call bell/phone within reach;with family/visitor present     Time: [id number] PT Time Calculation (min) (ACUTE ONLY): 28 min  Charges:  $Gait Training: 8-22 mins $Therapeutic Exercise: 8-22 mins                    G Codes:      Melford Aase 2016-10-10,  12:09 PM Elwyn Reach, [place]

## 2016-09-16 NOTE — Care Management Note (Addendum)
Case Management Note  Patient Details  Name: Lauren Maxwell MRN: [id number] Date of Birth: [birth year]  Subjective/Objective:     Sinus Bradycardia, Hypotension, AFib              Action/Plan: Discharge Planning: Chart reviewed. Notified [facility] that pt scheduled dc to SNF when medically stable. CSW following for SNF placement. Scheduled dc on 09/17/2016.  Expected Discharge Date:                 Expected Discharge Plan:  Skilled Nursing Facility  In-House Referral:  Clinical Social Work  Discharge planning Services  CM Consult  Post Acute Care Choice:  NA Choice offered to:  NA  DME Arranged:  N/A (Has Cane, RW and Rollator) DME Agency:  NA  HH Arranged:  NA HH Agency:  NA  Status of Service:  Completed, signed off  If discussed at [employer] of Stay Meetings, dates discussed:    Additional Comments:  Erenest Rasher, RN 09/16/2016, 10:10 AM

## 2016-09-16 NOTE — Progress Notes (Addendum)
SUBJECTIVE:  Denies chest pain or SOB.  No palpitations  OBJECTIVE:   Vitals:   Vitals:   09/15/16 0211 09/15/16 0615 09/15/16 1314 09/16/16 0637  BP: 138/74 (!) 142/75 137/83 117/85  Pulse: 83 83 68 84  Resp: 18 18 17 16   Temp: 97.8 F (36.6 C) 97.7 F (36.5 C) 98.1 F (36.7 C) 98.4 F (36.9 C)  TempSrc: Oral Oral Oral Oral  SpO2: 96% 96% 95% 100%  Weight:      Height:       I&O's:    Intake/Output Summary (Last 24 hours) at 09/16/16 0717 Last data filed at 09/15/16 1630  Gross per 24 hour  Intake              600 ml  Output                0 ml  Net              600 ml   TELEMETRY: Reviewed telemetry pt in NSR with no PAF    PHYSICAL EXAM General: Well developed, well nourished, in no acute distress Head: Eyes PERRLA, No xanthomas.   Normal cephalic and atramatic  Lungs:   Clear bilaterally to auscultation and percussion. Heart:  RRR S1 S2 Pulses are 2+ & equal. Abdomen: Bowel sounds are positive, abdomen soft and non-tender without masses Msk:  Back normal, normal gait. Normal strength and tone for age. Extremities:   No clubbing, cyanosis or edema.  DP +1 Neuro: Alert and oriented X 3. Psych:  Good affect, responds appropriately   LABS: Basic Metabolic Panel:  Recent Labs  09/15/16 0239 09/16/16 0416  NA 131* 130*  K 4.3 4.6  CL 98* 95*  CO2 25 24  GLUCOSE 96 91  BUN 6 10  CREATININE 0.52 0.64  CALCIUM 9.0 9.6  MG 2.0 1.8   Liver Function Tests: No results for input(s): AST, ALT, ALKPHOS, BILITOT, PROT, ALBUMIN in the last 72 hours. No results for input(s): LIPASE, AMYLASE in the last 72 hours. CBC:  Recent Labs  09/14/16 0325 09/16/16 0416  WBC 7.9 6.4  HGB 14.4 15.1*  HCT 41.1 43.2  MCV 95.4 95.2  PLT 195 230   Cardiac Enzymes: No results for input(s): CKTOTAL, CKMB, CKMBINDEX, TROPONINI in the last 72 hours. BNP: Invalid input(s): POCBNP D-Dimer: No results for input(s): DDIMER in the last 72 hours. Hemoglobin A1C: No  results for input(s): HGBA1C in the last 72 hours. Fasting Lipid Panel: No results for input(s): CHOL, HDL, LDLCALC, TRIG, CHOLHDL, LDLDIRECT in the last 72 hours. Thyroid Function Tests: No results for input(s): TSH, T4TOTAL, T3FREE, THYROIDAB in the last 72 hours.  Invalid input(s): FREET3 Anemia Panel: No results for input(s): VITAMINB12, FOLATE, FERRITIN, TIBC, IRON, RETICCTPCT in the last 72 hours. Coag Panel:   Lab Results  Component Value Date   INR 0.79 01/06/2012   INR 0.93 06/03/2010   INR 0.89 05/10/2010    RADIOLOGY: Dg Chest 2 View  Result Date: 09/15/2016 CLINICAL DATA:  Cough, history of COPD EXAM: CHEST  2 VIEW COMPARISON:  05/22/15 FINDINGS: Cardiac shadow is mildly enlarged but stable. Aortic calcifications and tortuosity it are again seen and stable. The lungs are hyperinflated consistent with COPD. Small bilateral pleural effusions are noted left greater than right new from the prior exam. No focal infiltrate is seen. Degenerative changes of the thoracic spine are noted as well as some compression deformity at T3 stable from the recent MRI. Stable T5  and T6 compression deformities are noted as well IMPRESSION: COPD. Bilateral pleural effusions left greater than right without definitive infiltrate. Electronically Signed   By: Inez Catalina M.D.   On: 09/15/2016 09:46   Ct Head Wo Contrast  Result Date: 09/11/2016 CLINICAL DATA:  Pain after fall. EXAM: CT HEAD WITHOUT CONTRAST TECHNIQUE: Contiguous axial images were obtained from the base of the skull through the vertex without intravenous contrast. COMPARISON:  Apr 25, 2016 FINDINGS: Brain: No subdural, epidural, or subarachnoid hemorrhage. Ventricles and sulci are stable. Cerebellum, brainstem, and basal cisterns are normal. No acute cortical ischemia or infarct. Moderate white matter changes again identified. No mass, mass effect, or midline shift. Vascular: Atherosclerotic changes seen in the intracranial carotid arteries.  Skull: Normal. Negative for fracture or focal lesion. Sinuses/Orbits: No acute finding. Other: No other abnormalities. IMPRESSION: No acute intracranial abnormality. Electronically Signed   By: Dorise Bullion III M.D   On: 09/11/2016 11:00   Mr Brain Wo Contrast  Result Date: 09/13/2016 CLINICAL DATA:  Slurred speech and difficulty walking. EXAM: MRI HEAD WITHOUT CONTRAST TECHNIQUE: Multiplanar, multiecho pulse sequences of the brain and surrounding structures were obtained without intravenous contrast. COMPARISON:  Head CT 09/11/2016 FINDINGS: Brain: No acute infarct or intraparenchymal hemorrhage. The midline structures are normal. There is beginning confluent hyperintense T2-weighted signal within the periventricular and deep white matter, most often seen in the setting of chronic microvascular ischemia. No mass lesion or midline shift. No hydrocephalus or extra-axial fluid collection. Vascular: Major intracranial arterial and venous sinus flow voids are preserved. No evidence of chronic microhemorrhage or amyloid angiopathy. Skull and upper cervical spine: The visualized skull base, calvarium, upper cervical spine and extracranial soft tissues are normal. Sinuses/Orbits: No fluid levels or advanced mucosal thickening. No mastoid effusion. Normal orbits. IMPRESSION: 1. No acute intracranial abnormality. 2. Chronic microvascular ischemic disease. Electronically Signed   By: Ulyses Jarred M.D.   On: 09/13/2016 21:41   Mr Cervical Spine Wo Contrast  Result Date: 09/12/2016 CLINICAL DATA:  Initial evaluation for generalized weakness with tear earlier disc thrive, frequent falls. EXAM: MRI CERVICAL SPINE WITHOUT CONTRAST TECHNIQUE: Multiplanar, multisequence MR imaging of the cervical spine was performed. No intravenous contrast was administered. COMPARISON:  None available. FINDINGS: Alignment: Vertebral bodies normally aligned with preservation of the normal cervical lordosis. No listhesis or subluxation.  Vertebrae: Vertebral body heights preserved. No evidence for acute or chronic fracture. Signal intensity within the vertebral body bone marrow is normal. No worrisome osseous lesions. No marrow edema. Cord: Signal intensity within the cervical spinal cord is normal. Posterior Fossa, vertebral arteries, paraspinal tissues: Visualized portions of the brain and posterior fossa are unremarkable. Craniocervical junction within normal limits. Paraspinous and prevertebral soft tissues within normal limits. Normal intravascular flow voids present within the vertebral arteries bilaterally. Disc levels: C2-C3: Mild bilateral uncovertebral hypertrophy without significant stenosis. C3-C4: Mild diffuse disc bulge with disc desiccation and intervertebral disc space narrowing. Bulging disc flattens the ventral thecal sac and results in mild canal stenosis. Superimposed left-sided uncovertebral hypertrophy with resultant mild left foraminal narrowing. C4-C5: Diffuse degenerative disc bulge with intervertebral disc space narrowing and disc desiccation. Bilateral uncovertebral hypertrophy. Posterior disc bulge flattens and partially faces the ventral thecal sac results in mild canal stenosis. Resultant moderate to severe bilateral foraminal stenosis, slightly worse on the left. C5-C6: Mild diffuse degenerative disc bulge with disc desiccation and intervertebral disc space narrowing. Left greater than right uncovertebral hypertrophy. Resultant severe bilateral foraminal stenosis, left greater than right. Bulging  disc flattens the ventral thecal sac and results in mild canal stenosis. C6-C7: Diffuse degenerative disc bulge with disc desiccation and intervertebral disc space narrowing. Left greater than right uncovertebral hypertrophy. Moderate left with mild right foraminal stenosis. Superimposed ligamentum flavum hypertrophy at this level. Resultant moderate canal stenosis. C7-T1: Mild diffuse disc bulge with uncovertebral  hypertrophy. Mild left foraminal narrowing. No significant right foraminal stenosis. Central canal widely patent. Partially visualized upper thoracic spine unremarkable without significant stenosis or degenerative changes. IMPRESSION: 1. No evidence for acute traumatic injury within the cervical spine. 2. Moderate multilevel degenerative spondylolysis with mild to moderate diffuse canal narrowing extending from C3-4 through C6-7, greatest at the C6-7 level. 3. Severe bilateral foraminal stenosis due to disc bulge and uncovertebral disease at C4-5 and C5-6, with moderate left foraminal narrowing at C6-7. Electronically Signed   By: Jeannine Boga M.D.   On: 09/12/2016 16:15   Mr Thoracic Spine Wo Contrast  Result Date: 09/12/2016 CLINICAL DATA:  Persistent mid and low back pain. Multiple recent falls. EXAM: MRI THORACIC SPINE WITHOUT CONTRAST TECHNIQUE: Multiplanar, multisequence MR imaging of the thoracic spine was performed. No intravenous contrast was administered. COMPARISON:  Radiographs dated 07/07/2016 and lumbar MRI dated 06/25/2016 and chest CT dated 05/15/2013 FINDINGS: Alignment: There is accentuation of the thoracic kyphosis. No subluxation. Vertebrae: There is a subacute subtle benign-appearing compression fracture of the T3 vertebral body with slight residual edema in the vertebra. No protrusion of bone or disc material into the spinal canal. No bone destruction. There are old slight anterior wedge deformities of T5 and T6, unchanged since the prior CT scan. Degenerative changes of the vertebral endplates at [id number], chronic. Cord:  Normal.  Conus tip at T12-L1. Paraspinal and other soft tissues: There is a small left pleural effusion and a tiny right effusion. There is aneurysmal dilatation of the arch of the aorta to a diameter of 4.1 cm at the top of the ascending aorta. Aortic atherosclerosis. Disc levels: The discs throughout the thoracic spine demonstrate no protrusion or bulging. No  foraminal or spinal stenosis. IMPRESSION: 1. Subacute mild benign-appearing compression fracture of the T3 vertebral body. No neural impingement. 2. Old compression deformities of T5 and T6, stable. 3. Small left pleural effusion. 4. Slight aneurysmal dilatation of the top of the ascending thoracic aorta to a diameter of 4.1 cm. 5. Aortic atherosclerosis. Electronically Signed   By: Lorriane Shire M.D.   On: 09/12/2016 16:43   Mr Lumbar Spine Wo Contrast  Result Date: 09/12/2016 CLINICAL DATA:  History of falls.  Failure to thrive. EXAM: MRI LUMBAR SPINE WITHOUT CONTRAST TECHNIQUE: Multiplanar, multisequence MR imaging of the lumbar spine was performed. No intravenous contrast was administered. COMPARISON:  06/25/2016; 07/07/2016 FINDINGS: Despite efforts by the technologist and patient, severe motion artifact is present on today's exam and could not be eliminated. This reduces exam sensitivity and specificity. Segmentation: The lowest lumbar type non-rib-bearing vertebra is labeled as L5. Alignment: 8 mm anterolisthesis at L5-S1 associated with bilateral L5 pars defects. Vertebrae: Type 1 degenerative endplate findings eccentric to the right at L5-S1, fairly similar to the prior exam. Remote inferior endplate compressions at L5 and L2, no change from prior. There is some subtle lower sacral or sacrococcygeal edema and low-level presacral edema does not visibly present previously, then the possibility of a subtle fracture in the sacrococcygeal region is raised, although given the degree of motion artifact, I am not entirely certain. Conus medullaris: Extends to the T12-L1 level and appears normal. Paraspinal  and other soft tissues: On inversion recovery weighted images there is some equivocal edema in the paraspinal musculature of the lumbar spine. Disc levels: L1-2:  Unremarkable. L2-3:  No definite impingement. L3-4:  No definite impingement. L4-5: Stable mild left foraminal stenosis due to disc uncovering and  left facet arthropathy. L5-S1: Stable mild right and borderline left foraminal stenosis due to disc uncovering and facet arthropathy. IMPRESSION: 1. Subtle edema in the sacrococcygeal region could reflect a nondisplaced fracture. Motion artifact makes this finding not entirely certain. 2. Low-level paraspinal muscular edema, possible muscular strain. 3. No new compression fracture in the lumbar spine is identified. Old inferior endplate compressions at L5 and L2. 4. Stable mild impingement at L4-5 and L5-S1. 5. Stable appearance of bilateral pars defects at L5 with 8 mm anterolisthesis of L5 on S1. Electronically Signed   By: Van Clines M.D.   On: 09/12/2016 16:55    Assessment & Plan    1. Sinus Bradycardia - presented after a fall at home in which she reported weakness along her lower extremities. Reported hitting her head but denied a loss of consciousness. No prodromal symptoms noted. - HR variable 40's - 70's while in the ED. No significant pauses on telemetry. In and out of afib during admission.  Possible tachybrady syndrome but we do not have any documented strips with significant bradycardia.  Appreciate EP input.  No indication for  PPM at this time unless we can correlate patient's symptoms with bradycardia.  Would benefit from 30 day heart monitor at discharge which we will set up.  Will set up OV in afib clinic in 3-4 weeks.    2. Hypotension - BP has been normal over the past 24 hours after increasing Lisinopril to 20mg  daily.   3. Paroxysmal Atrial Fibrillation -  Episodes of afib improved since potassium replete but still having some episodes of PAF on monitor with RVR.   May be triggered by hypokalemia.  Need to keep K > 4. - This patients CHA2DS2-VASc Score and unadjusted Ischemic Stroke Rate (% per year) is equal to 7.2 % stroke rate/year from a score of 5 (CHF, HTN, Female, Age (2)). Continue Eliquis for anticoagulation. Reduced dosing in setting of weight of 46 kg and  age of 20. - Cardizem long acting has been stopped per EP and using short acting for breakthrough. - ? Whether hypokalemia is triggering her afib.  Once potassium has been consistently > 4, if the continues to have PAF, may need to consider AAD with Amio or Tikosyn.  Cannot use Tikosyn in setting of persistent hypo K and hypo Mag.  Will get outpt event monitor to assess afib load - appreciate EP input - will followup in afib clinic in 3-4 weeks. - she has been cautioned about continue alcohol use.  4. Alcohol Use - consumes 3-5 oz of liquor per day. - currently on CIWA protocol. Per admitting team  5. Tobacco Use - cessation advised.  6.  Hypokalemia - repleted.  ? Etiology.  She is not on diuretic therapy.  She has not had diarrhea in some time.  She has had poor PO intake recently.  ? Primary renal issue with potassium wasting.  Consider renal consult.   7.  Weakness and difficulty walking ? Upper motor neuron dysfunction - Neuro following.   Fransico Him, MD  09/16/2016  7:17 AM

## 2016-09-16 NOTE — Consult Note (Signed)
            [facility] CM Primary Care Navigator  09/16/2016  Lauren Maxwell [birth year] [id number]  Patient was seen at the bedside to identify possible discharge needs.  Patient shared that her "right leg gave way" resulting to fall which led to this admission. Patient also mentioned feeling weak with recurrent fall episodes previously.  According to patient, she lives alone and discharge plan is for possible short- term SNF placement to [facility] as confirmed by RN. Discussed with patient regarding smoking cessation and alcohol abuse. She states she hasn't smoked for the last 6 days with plan to quit smoking. She also mentioned of not to resume with drinking alcohol as well.   Patient confirms that primary care provider is Dr. Mayra Neer with [place] [facility] Transportation to doctors' appointments provided by son Richardson Landry).  Patient states using [facility] in [place] to obtain medications with no difficulty. She mentioned doing her own medication management at home following a list of medicines and taking out straight from the containers.  Son Richardson Landry) and daughter Arbie Cookey) living close by are whom patient is depending on to assist her with care needs when discharged to home as stated.  Patient had expressed understanding to call primary care provider's office for a post discharge follow-up appointment within a week or sooner if needed, once discharged to home. Patient letter provided as a reminder.  Patient denies needs or issues for any disease management at this time.  For questions, please contact:  Edwena Felty A. Dalbert Stillings, BSN, RN-BC [facility] PRIMARY CARE Navigator Cell: [phone]

## 2016-09-17 DIAGNOSIS — Z789 Other specified health status: Secondary | ICD-10-CM

## 2016-09-17 LAB — BASIC METABOLIC PANEL
ANION GAP: 12 (ref 5–15)
BUN: 12 mg/dL (ref 6–20)
CHLORIDE: 94 mmol/L — AB (ref 101–111)
CO2: 23 mmol/L (ref 22–32)
CREATININE: 0.65 mg/dL (ref 0.44–1.00)
Calcium: 9.6 mg/dL (ref 8.9–10.3)
GFR calc non Af Amer: >60 mL/min (ref >60)
Glucose, Bld: 103 mg/dL — ABNORMAL HIGH (ref 65–99)
Potassium: 4.3 mmol/L (ref 3.5–5.1)
Sodium: 129 mmol/L — ABNORMAL LOW (ref 135–145)

## 2016-09-17 LAB — MAGNESIUM: Magnesium: 1.9 mg/dL (ref 1.7–2.4)

## 2016-09-17 MED ORDER — DILTIAZEM HCL ER COATED BEADS 120 MG PO CP24: 120.0000 mg | ORAL_CAPSULE | Freq: Every day | ORAL | 1 refills | Status: DC

## 2016-09-17 MED ORDER — DILTIAZEM HCL 30 MG PO TABS: 30.0000 mg | ORAL_TABLET | Freq: Four times a day (QID) | ORAL | 0 refills | Status: DC | PRN

## 2016-09-17 MED ORDER — DILTIAZEM HCL ER COATED BEADS 120 MG PO CP24
120.0000 mg | ORAL_CAPSULE | Freq: Every day | ORAL | Status: DC
  Administered 2016-09-17: 120 mg via ORAL
  Filled 2016-09-17: qty 1

## 2016-09-17 NOTE — Discharge Summary (Addendum)
Physician Discharge Summary  DAO MIEDEMA [id number] DOB: 11-Apr-1931 DOA: 09/11/2016  PCP: Mayra Neer, MD  Admit date: 09/11/2016 Discharge date: 09/17/2016  Admitted From: HOME Disposition:  SNF  Recommendations for Outpatient Follow-up:  1. Follow up with PCP in 1-2 weeks 2. Please obtain BMP/CBC, magnesium on 09/20/16    Discharge Condition: Stable CODE STATUS: FULL Diet recommendation: Heart Healthy  Brief/Interim Summary: 82 year old female with a history of depression, lymphoma, hypertension, tobacco and alcohol use, chronic diastolic CHF, CAD noted on CT in 2014, dilated ascending aorta, and atrial fibrillation presented to [facility] on 09/11/2016 for evaluation of generalized weakness, dizziness, and fall. She reports walking around her home when she fell while using her walker. Developed weakness along her lower extremities and fell backwards. Reported hitting her head but denied a loss of consciousness. She has been experiencing weakness for several months and recently was diagnosed with a foraminal stenosis and a herniated disc in the cervical spine.  No prior cardiac workup for CAD besides being noted on CT Imaging in 2014. No recent chest discomfort, palpitations, or dyspnea with exertion.   Prior to admission medications include Cartia XT 120mg  daily, Eliquis 2.5mg  BID, and Lisinopril 20mg  BID.  While in the ED, she has been noted to be bradycardiac in the 40's. HR is currently in the 70's. No significant pauses are noted on telemetry. Labs show a WBC of 8.7, Hgb 15.5, and platelets 216. K+ 3.4 and creatinine 0.66. Ethanol <5. TSH 2.227. CT Head on admission was without acute intracranial abnormalities. EKG shows NSR, HR 67, LAD, with no acute ST or T-wave changes.   Does consume between 3-5 oz of liquor per day. Has a 31 pack-year tobacco history. No known recreational drug use.   Discharge Diagnoses:  Sinus bradycardia -Appreciate cardiology  consultation and follow-up - HR has been variable in 40's - 70's while in the ED. No significant pauses on telemetry. In and out of afib during admission. Possible tachybrady syndrome but we do not have any documented strips with significant bradycardia. Appreciated EP input. No indication for PPM at this time unless we can correlate patient's symptoms with bradycardia. Would benefit from 30 day heart monitor at discharge which we will set up. Cardiology Will set up OV in afib clinic in 3-4 weeks.   Hypotension -likely due to volume depletion  -resolved - BP has been normal over the past 24 hours after increasing Lisinopril to 20mg  daily.   Paroxysmal Atrial Fibrillation - No further afib since potassium repleted. May have been triggered by hypokalemia. Need to keep K >4. - This patients CHA2DS2-VASc Score and unadjusted Ischemic Stroke Rate (% per year) is equal to 7.2 % stroke rate/year from a score of 5 (CHF, HTN, Female, Age (2)). Continue Eliquis for anticoagulation. Reduced dosing in setting of weight of 46 kg and age of 37. - Cardizem long acting initially stopped and using short acting for breakthrough. - ?hypokalemia contributing/triggering her afib. Once potassium has been consistently >4, if the continues to have PAF, may need to consider AAD with Amio or Tikosyn. Cannot use Tikosyn in setting of persistent hypo K and hypo Mag. No further PAF since K has been repleted. - appreciate EP input - cardiology will followup in afib clinic in 3-4 weeks. -09/16/16--pt developed afib again with RVR--cardiology then recommended restart diltiazem CD 120 mg daily and short acting diltiazem 30 mg q 6 hr prn HR > 120  Generalized weakness/failure to thrive/frequent falls. In the setting of chronic  anxiety/depression and ETOH use.  -No sign of infectious process. Mild metabolic derangement which have been corrected -MRI c-spine--severe bilateral stenosis secondary to disc bulge C4-5,  C5-6 -Case discussed with neurosurgery, Dr. Cyndy Freeze on 10/13--findings do not explain patient's symptoms, no cord impingement; continue non-surgical management -lumbar spine MR--low-level perimuscular edema, mild L4-5, L5-S1 impingement and stenosis, anterolisthesis L5 on S1 -MRI brain with chronic changes and possible outpatient EMG--refer to Dr. Narda Amber, neuromuscular specialist -B1 and B12-- normal -TSH--2.227 -PT consult--->SNF -?Etoh myopathy  Hypokalemia/hypomagnesemia/hyponatremia -Likely due to poor oral intake, poor solute intake in the setting of chronic alcohol use suspect patient also has underlying alcoholic liver disease contributing to hyponatremia -Sodium level has been stable 130-133 -Continue daily potassium supplementation--discontinue as K trending high and K is improving since starting lisinopril -Recheck potassium and mag 09/20/2016   Alcohol abuse -Checks 4-6 ounces of vodka daily for the past 30 years -No signs of withdrawal  Tobacco use -Tobacco cessation discussed  Chronic diastolic CHF -Clinically compensated -09/15/2016 echo EF 55-60%, grade 1 DD, mild MR   Discharge Instructions  Discharge Instructions    Ambulatory referral to Neurology    Complete by:  As directed    Refer to [facility]   Call son, Sanijah Estelle for appointment [phone]   Diet - low sodium heart healthy    Complete by:  As directed    Increase activity slowly    Complete by:  As directed        Medication List    TAKE these medications   diltiazem 120 MG 24 hr capsule Commonly known as:  CARDIZEM CD Take 1 capsule (120 mg total) by mouth daily. What changed:  See the new instructions.   diltiazem 30 MG tablet Commonly known as:  CARDIZEM Take 1 tablet (30 mg total) by mouth every 6 (six) hours as needed (HR > 120).   ELIQUIS 2.5 MG Tabs tablet Generic drug:  apixaban take 1 tablet by mouth twice a day   lisinopril 20 MG tablet Commonly known as:   PRINIVIL,ZESTRIL Take 20 mg by mouth 2 (two) times daily.   multivitamin tablet Take 1 tablet by mouth every other day. Centrum Silver   sodium chloride 0.65 % nasal spray Commonly known as:  OCEAN Place 1 spray into the nose as needed. For nasal congestion.   thiamine 100 MG tablet Take 1 tablet (100 mg total) by mouth daily.      Follow-up [facility], MD .   Specialty:  Cardiology Why:  The office will call.  Contact information: [id number] [place] [address] [phone]          Allergies  Allergen Reactions  . Codeine Nausea And Vomiting    Consultations:  Cardiology/EP  neurology   Procedures/Studies: Dg Chest 2 View  Result Date: 09/15/2016 CLINICAL DATA:  Cough, history of COPD EXAM: CHEST  2 VIEW COMPARISON:  05/22/15 FINDINGS: Cardiac shadow is mildly enlarged but stable. Aortic calcifications and tortuosity it are again seen and stable. The lungs are hyperinflated consistent with COPD. Small bilateral pleural effusions are noted left greater than right new from the prior exam. No focal infiltrate is seen. Degenerative changes of the thoracic spine are noted as well as some compression deformity at T3 stable from the recent MRI. Stable T5 and T6 compression deformities are noted as well IMPRESSION: COPD. Bilateral pleural effusions left greater than right without definitive infiltrate. Electronically Signed  By: Inez Catalina M.D.   On: 09/15/2016 09:46   Ct Head Wo Contrast  Result Date: 09/11/2016 CLINICAL DATA:  Pain after fall. EXAM: CT HEAD WITHOUT CONTRAST TECHNIQUE: Contiguous axial images were obtained from the base of the skull through the vertex without intravenous contrast. COMPARISON:  Apr 25, 2016 FINDINGS: Brain: No subdural, epidural, or subarachnoid hemorrhage. Ventricles and sulci are stable. Cerebellum, brainstem, and basal cisterns are normal. No acute cortical ischemia or infarct. Moderate  white matter changes again identified. No mass, mass effect, or midline shift. Vascular: Atherosclerotic changes seen in the intracranial carotid arteries. Skull: Normal. Negative for fracture or focal lesion. Sinuses/Orbits: No acute finding. Other: No other abnormalities. IMPRESSION: No acute intracranial abnormality. Electronically Signed   By: Dorise Bullion III M.D   On: 09/11/2016 11:00   Mr Brain Wo Contrast  Result Date: 09/13/2016 CLINICAL DATA:  Slurred speech and difficulty walking. EXAM: MRI HEAD WITHOUT CONTRAST TECHNIQUE: Multiplanar, multiecho pulse sequences of the brain and surrounding structures were obtained without intravenous contrast. COMPARISON:  Head CT 09/11/2016 FINDINGS: Brain: No acute infarct or intraparenchymal hemorrhage. The midline structures are normal. There is beginning confluent hyperintense T2-weighted signal within the periventricular and deep white matter, most often seen in the setting of chronic microvascular ischemia. No mass lesion or midline shift. No hydrocephalus or extra-axial fluid collection. Vascular: Major intracranial arterial and venous sinus flow voids are preserved. No evidence of chronic microhemorrhage or amyloid angiopathy. Skull and upper cervical spine: The visualized skull base, calvarium, upper cervical spine and extracranial soft tissues are normal. Sinuses/Orbits: No fluid levels or advanced mucosal thickening. No mastoid effusion. Normal orbits. IMPRESSION: 1. No acute intracranial abnormality. 2. Chronic microvascular ischemic disease. Electronically Signed   By: Ulyses Jarred M.D.   On: 09/13/2016 21:41   Mr Cervical Spine Wo Contrast  Result Date: 09/12/2016 CLINICAL DATA:  Initial evaluation for generalized weakness with tear earlier disc thrive, frequent falls. EXAM: MRI CERVICAL SPINE WITHOUT CONTRAST TECHNIQUE: Multiplanar, multisequence MR imaging of the cervical spine was performed. No intravenous contrast was administered.  COMPARISON:  None available. FINDINGS: Alignment: Vertebral bodies normally aligned with preservation of the normal cervical lordosis. No listhesis or subluxation. Vertebrae: Vertebral body heights preserved. No evidence for acute or chronic fracture. Signal intensity within the vertebral body bone marrow is normal. No worrisome osseous lesions. No marrow edema. Cord: Signal intensity within the cervical spinal cord is normal. Posterior Fossa, vertebral arteries, paraspinal tissues: Visualized portions of the brain and posterior fossa are unremarkable. Craniocervical junction within normal limits. Paraspinous and prevertebral soft tissues within normal limits. Normal intravascular flow voids present within the vertebral arteries bilaterally. Disc levels: C2-C3: Mild bilateral uncovertebral hypertrophy without significant stenosis. C3-C4: Mild diffuse disc bulge with disc desiccation and intervertebral disc space narrowing. Bulging disc flattens the ventral thecal sac and results in mild canal stenosis. Superimposed left-sided uncovertebral hypertrophy with resultant mild left foraminal narrowing. C4-C5: Diffuse degenerative disc bulge with intervertebral disc space narrowing and disc desiccation. Bilateral uncovertebral hypertrophy. Posterior disc bulge flattens and partially faces the ventral thecal sac results in mild canal stenosis. Resultant moderate to severe bilateral foraminal stenosis, slightly worse on the left. C5-C6: Mild diffuse degenerative disc bulge with disc desiccation and intervertebral disc space narrowing. Left greater than right uncovertebral hypertrophy. Resultant severe bilateral foraminal stenosis, left greater than right. Bulging disc flattens the ventral thecal sac and results in mild canal stenosis. C6-C7: Diffuse degenerative disc bulge with disc desiccation and intervertebral disc space  narrowing. Left greater than right uncovertebral hypertrophy. Moderate left with mild right foraminal  stenosis. Superimposed ligamentum flavum hypertrophy at this level. Resultant moderate canal stenosis. C7-T1: Mild diffuse disc bulge with uncovertebral hypertrophy. Mild left foraminal narrowing. No significant right foraminal stenosis. Central canal widely patent. Partially visualized upper thoracic spine unremarkable without significant stenosis or degenerative changes. IMPRESSION: 1. No evidence for acute traumatic injury within the cervical spine. 2. Moderate multilevel degenerative spondylolysis with mild to moderate diffuse canal narrowing extending from C3-4 through C6-7, greatest at the C6-7 level. 3. Severe bilateral foraminal stenosis due to disc bulge and uncovertebral disease at C4-5 and C5-6, with moderate left foraminal narrowing at C6-7. Electronically Signed   By: Jeannine Boga M.D.   On: 09/12/2016 16:15   Mr Thoracic Spine Wo Contrast  Result Date: 09/12/2016 CLINICAL DATA:  Persistent mid and low back pain. Multiple recent falls. EXAM: MRI THORACIC SPINE WITHOUT CONTRAST TECHNIQUE: Multiplanar, multisequence MR imaging of the thoracic spine was performed. No intravenous contrast was administered. COMPARISON:  Radiographs dated 07/07/2016 and lumbar MRI dated 06/25/2016 and chest CT dated 05/15/2013 FINDINGS: Alignment: There is accentuation of the thoracic kyphosis. No subluxation. Vertebrae: There is a subacute subtle benign-appearing compression fracture of the T3 vertebral body with slight residual edema in the vertebra. No protrusion of bone or disc material into the spinal canal. No bone destruction. There are old slight anterior wedge deformities of T5 and T6, unchanged since the prior CT scan. Degenerative changes of the vertebral endplates at [id number], chronic. Cord:  Normal.  Conus tip at T12-L1. Paraspinal and other soft tissues: There is a small left pleural effusion and a tiny right effusion. There is aneurysmal dilatation of the arch of the aorta to a diameter of 4.1 cm at the  top of the ascending aorta. Aortic atherosclerosis. Disc levels: The discs throughout the thoracic spine demonstrate no protrusion or bulging. No foraminal or spinal stenosis. IMPRESSION: 1. Subacute mild benign-appearing compression fracture of the T3 vertebral body. No neural impingement. 2. Old compression deformities of T5 and T6, stable. 3. Small left pleural effusion. 4. Slight aneurysmal dilatation of the top of the ascending thoracic aorta to a diameter of 4.1 cm. 5. Aortic atherosclerosis. Electronically Signed   By: Lorriane Shire M.D.   On: 09/12/2016 16:43   Mr Lumbar Spine Wo Contrast  Result Date: 09/12/2016 CLINICAL DATA:  History of falls.  Failure to thrive. EXAM: MRI LUMBAR SPINE WITHOUT CONTRAST TECHNIQUE: Multiplanar, multisequence MR imaging of the lumbar spine was performed. No intravenous contrast was administered. COMPARISON:  06/25/2016; 07/07/2016 FINDINGS: Despite efforts by the technologist and patient, severe motion artifact is present on today's exam and could not be eliminated. This reduces exam sensitivity and specificity. Segmentation: The lowest lumbar type non-rib-bearing vertebra is labeled as L5. Alignment: 8 mm anterolisthesis at L5-S1 associated with bilateral L5 pars defects. Vertebrae: Type 1 degenerative endplate findings eccentric to the right at L5-S1, fairly similar to the prior exam. Remote inferior endplate compressions at L5 and L2, no change from prior. There is some subtle lower sacral or sacrococcygeal edema and low-level presacral edema does not visibly present previously, then the possibility of a subtle fracture in the sacrococcygeal region is raised, although given the degree of motion artifact, I am not entirely certain. Conus medullaris: Extends to the T12-L1 level and appears normal. Paraspinal and other soft tissues: On inversion recovery weighted images there is some equivocal edema in the paraspinal musculature of the lumbar spine. Disc levels:  L1-2:   Unremarkable. L2-3:  No definite impingement. L3-4:  No definite impingement. L4-5: Stable mild left foraminal stenosis due to disc uncovering and left facet arthropathy. L5-S1: Stable mild right and borderline left foraminal stenosis due to disc uncovering and facet arthropathy. IMPRESSION: 1. Subtle edema in the sacrococcygeal region could reflect a nondisplaced fracture. Motion artifact makes this finding not entirely certain. 2. Low-level paraspinal muscular edema, possible muscular strain. 3. No new compression fracture in the lumbar spine is identified. Old inferior endplate compressions at L5 and L2. 4. Stable mild impingement at L4-5 and L5-S1. 5. Stable appearance of bilateral pars defects at L5 with 8 mm anterolisthesis of L5 on S1. Electronically Signed   By: Van Clines M.D.   On: 09/12/2016 16:55        Discharge Exam: Vitals:   09/16/16 2125 09/17/16 0408  BP: 103/68 117/70  Pulse: (!) 104 77  Resp: 18 18  Temp: 97.6 F (36.4 C) 97.6 F (36.4 C)   Vitals:   09/16/16 1309 09/16/16 1731 09/16/16 2125 09/17/16 0408  BP: 116/77 113/74 103/68 117/70  Pulse: (!) 106  (!) 104 77  Resp: 17  18 18   Temp: 97.4 F (36.3 C)  97.6 F (36.4 C) 97.6 F (36.4 C)  TempSrc: Oral  Oral Oral  SpO2: 95%  96% 96%  Weight:      Height:        General: Pt is alert, awake, not in acute distress Cardiovascular: IRRR, S1/S2 +, no rubs, no gallops Respiratory: CTA bilaterally, no wheezing, no rhonchi Abdominal: Soft, NT, ND, bowel sounds + Extremities: no edema, no cyanosis   The results of significant diagnostics from this hospitalization (including imaging, microbiology, ancillary and laboratory) are listed below for reference.    Significant Diagnostic Studies: Dg Chest 2 View  Result Date: 09/15/2016 CLINICAL DATA:  Cough, history of COPD EXAM: CHEST  2 VIEW COMPARISON:  05/22/15 FINDINGS: Cardiac shadow is mildly enlarged but stable. Aortic calcifications and tortuosity it  are again seen and stable. The lungs are hyperinflated consistent with COPD. Small bilateral pleural effusions are noted left greater than right new from the prior exam. No focal infiltrate is seen. Degenerative changes of the thoracic spine are noted as well as some compression deformity at T3 stable from the recent MRI. Stable T5 and T6 compression deformities are noted as well IMPRESSION: COPD. Bilateral pleural effusions left greater than right without definitive infiltrate. Electronically Signed   By: Inez Catalina M.D.   On: 09/15/2016 09:46   Ct Head Wo Contrast  Result Date: 09/11/2016 CLINICAL DATA:  Pain after fall. EXAM: CT HEAD WITHOUT CONTRAST TECHNIQUE: Contiguous axial images were obtained from the base of the skull through the vertex without intravenous contrast. COMPARISON:  Apr 25, 2016 FINDINGS: Brain: No subdural, epidural, or subarachnoid hemorrhage. Ventricles and sulci are stable. Cerebellum, brainstem, and basal cisterns are normal. No acute cortical ischemia or infarct. Moderate white matter changes again identified. No mass, mass effect, or midline shift. Vascular: Atherosclerotic changes seen in the intracranial carotid arteries. Skull: Normal. Negative for fracture or focal lesion. Sinuses/Orbits: No acute finding. Other: No other abnormalities. IMPRESSION: No acute intracranial abnormality. Electronically Signed   By: Dorise Bullion III M.D   On: 09/11/2016 11:00   Mr Brain Wo Contrast  Result Date: 09/13/2016 CLINICAL DATA:  Slurred speech and difficulty walking. EXAM: MRI HEAD WITHOUT CONTRAST TECHNIQUE: Multiplanar, multiecho pulse sequences of the brain and surrounding structures were obtained without intravenous  contrast. COMPARISON:  Head CT 09/11/2016 FINDINGS: Brain: No acute infarct or intraparenchymal hemorrhage. The midline structures are normal. There is beginning confluent hyperintense T2-weighted signal within the periventricular and deep white matter, most often  seen in the setting of chronic microvascular ischemia. No mass lesion or midline shift. No hydrocephalus or extra-axial fluid collection. Vascular: Major intracranial arterial and venous sinus flow voids are preserved. No evidence of chronic microhemorrhage or amyloid angiopathy. Skull and upper cervical spine: The visualized skull base, calvarium, upper cervical spine and extracranial soft tissues are normal. Sinuses/Orbits: No fluid levels or advanced mucosal thickening. No mastoid effusion. Normal orbits. IMPRESSION: 1. No acute intracranial abnormality. 2. Chronic microvascular ischemic disease. Electronically Signed   By: Ulyses Jarred M.D.   On: 09/13/2016 21:41   Mr Cervical Spine Wo Contrast  Result Date: 09/12/2016 CLINICAL DATA:  Initial evaluation for generalized weakness with tear earlier disc thrive, frequent falls. EXAM: MRI CERVICAL SPINE WITHOUT CONTRAST TECHNIQUE: Multiplanar, multisequence MR imaging of the cervical spine was performed. No intravenous contrast was administered. COMPARISON:  None available. FINDINGS: Alignment: Vertebral bodies normally aligned with preservation of the normal cervical lordosis. No listhesis or subluxation. Vertebrae: Vertebral body heights preserved. No evidence for acute or chronic fracture. Signal intensity within the vertebral body bone marrow is normal. No worrisome osseous lesions. No marrow edema. Cord: Signal intensity within the cervical spinal cord is normal. Posterior Fossa, vertebral arteries, paraspinal tissues: Visualized portions of the brain and posterior fossa are unremarkable. Craniocervical junction within normal limits. Paraspinous and prevertebral soft tissues within normal limits. Normal intravascular flow voids present within the vertebral arteries bilaterally. Disc levels: C2-C3: Mild bilateral uncovertebral hypertrophy without significant stenosis. C3-C4: Mild diffuse disc bulge with disc desiccation and intervertebral disc space  narrowing. Bulging disc flattens the ventral thecal sac and results in mild canal stenosis. Superimposed left-sided uncovertebral hypertrophy with resultant mild left foraminal narrowing. C4-C5: Diffuse degenerative disc bulge with intervertebral disc space narrowing and disc desiccation. Bilateral uncovertebral hypertrophy. Posterior disc bulge flattens and partially faces the ventral thecal sac results in mild canal stenosis. Resultant moderate to severe bilateral foraminal stenosis, slightly worse on the left. C5-C6: Mild diffuse degenerative disc bulge with disc desiccation and intervertebral disc space narrowing. Left greater than right uncovertebral hypertrophy. Resultant severe bilateral foraminal stenosis, left greater than right. Bulging disc flattens the ventral thecal sac and results in mild canal stenosis. C6-C7: Diffuse degenerative disc bulge with disc desiccation and intervertebral disc space narrowing. Left greater than right uncovertebral hypertrophy. Moderate left with mild right foraminal stenosis. Superimposed ligamentum flavum hypertrophy at this level. Resultant moderate canal stenosis. C7-T1: Mild diffuse disc bulge with uncovertebral hypertrophy. Mild left foraminal narrowing. No significant right foraminal stenosis. Central canal widely patent. Partially visualized upper thoracic spine unremarkable without significant stenosis or degenerative changes. IMPRESSION: 1. No evidence for acute traumatic injury within the cervical spine. 2. Moderate multilevel degenerative spondylolysis with mild to moderate diffuse canal narrowing extending from C3-4 through C6-7, greatest at the C6-7 level. 3. Severe bilateral foraminal stenosis due to disc bulge and uncovertebral disease at C4-5 and C5-6, with moderate left foraminal narrowing at C6-7. Electronically Signed   By: Jeannine Boga M.D.   On: 09/12/2016 16:15   Mr Thoracic Spine Wo Contrast  Result Date: 09/12/2016 CLINICAL DATA:   Persistent mid and low back pain. Multiple recent falls. EXAM: MRI THORACIC SPINE WITHOUT CONTRAST TECHNIQUE: Multiplanar, multisequence MR imaging of the thoracic spine was performed. No intravenous contrast was administered. COMPARISON:  Radiographs dated 07/07/2016 and lumbar MRI dated 06/25/2016 and chest CT dated 05/15/2013 FINDINGS: Alignment: There is accentuation of the thoracic kyphosis. No subluxation. Vertebrae: There is a subacute subtle benign-appearing compression fracture of the T3 vertebral body with slight residual edema in the vertebra. No protrusion of bone or disc material into the spinal canal. No bone destruction. There are old slight anterior wedge deformities of T5 and T6, unchanged since the prior CT scan. Degenerative changes of the vertebral endplates at [id number], chronic. Cord:  Normal.  Conus tip at T12-L1. Paraspinal and other soft tissues: There is a small left pleural effusion and a tiny right effusion. There is aneurysmal dilatation of the arch of the aorta to a diameter of 4.1 cm at the top of the ascending aorta. Aortic atherosclerosis. Disc levels: The discs throughout the thoracic spine demonstrate no protrusion or bulging. No foraminal or spinal stenosis. IMPRESSION: 1. Subacute mild benign-appearing compression fracture of the T3 vertebral body. No neural impingement. 2. Old compression deformities of T5 and T6, stable. 3. Small left pleural effusion. 4. Slight aneurysmal dilatation of the top of the ascending thoracic aorta to a diameter of 4.1 cm. 5. Aortic atherosclerosis. Electronically Signed   By: Lorriane Shire M.D.   On: 09/12/2016 16:43   Mr Lumbar Spine Wo Contrast  Result Date: 09/12/2016 CLINICAL DATA:  History of falls.  Failure to thrive. EXAM: MRI LUMBAR SPINE WITHOUT CONTRAST TECHNIQUE: Multiplanar, multisequence MR imaging of the lumbar spine was performed. No intravenous contrast was administered. COMPARISON:  06/25/2016; 07/07/2016 FINDINGS: Despite efforts  by the technologist and patient, severe motion artifact is present on today's exam and could not be eliminated. This reduces exam sensitivity and specificity. Segmentation: The lowest lumbar type non-rib-bearing vertebra is labeled as L5. Alignment: 8 mm anterolisthesis at L5-S1 associated with bilateral L5 pars defects. Vertebrae: Type 1 degenerative endplate findings eccentric to the right at L5-S1, fairly similar to the prior exam. Remote inferior endplate compressions at L5 and L2, no change from prior. There is some subtle lower sacral or sacrococcygeal edema and low-level presacral edema does not visibly present previously, then the possibility of a subtle fracture in the sacrococcygeal region is raised, although given the degree of motion artifact, I am not entirely certain. Conus medullaris: Extends to the T12-L1 level and appears normal. Paraspinal and other soft tissues: On inversion recovery weighted images there is some equivocal edema in the paraspinal musculature of the lumbar spine. Disc levels: L1-2:  Unremarkable. L2-3:  No definite impingement. L3-4:  No definite impingement. L4-5: Stable mild left foraminal stenosis due to disc uncovering and left facet arthropathy. L5-S1: Stable mild right and borderline left foraminal stenosis due to disc uncovering and facet arthropathy. IMPRESSION: 1. Subtle edema in the sacrococcygeal region could reflect a nondisplaced fracture. Motion artifact makes this finding not entirely certain. 2. Low-level paraspinal muscular edema, possible muscular strain. 3. No new compression fracture in the lumbar spine is identified. Old inferior endplate compressions at L5 and L2. 4. Stable mild impingement at L4-5 and L5-S1. 5. Stable appearance of bilateral pars defects at L5 with 8 mm anterolisthesis of L5 on S1. Electronically Signed   By: Van Clines M.D.   On: 09/12/2016 16:55     Microbiology: No results found for this or any previous visit (from the past  240 hour(s)).   Labs: Basic Metabolic Panel:  Recent Labs Lab 09/13/16 0410 09/14/16 0325 09/15/16 0239 09/16/16 0416 09/17/16 0359  NA 132* 133* 131* 130* 129*  K 3.3* 3.3* 4.3 4.6 4.3  CL 99* 96* 98* 95* 94*  CO2 24 26 25 24 23   GLUCOSE 104* 97 96 91 103*  BUN <5* 5* 6 10 12   CREATININE 0.51 0.55 0.52 0.64 0.65  CALCIUM 9.0 9.1 9.0 9.6 9.6  MG 1.5* 1.9 2.0 1.8 1.9   Liver Function Tests:  Recent Labs Lab 09/12/16 1328 09/13/16 0410  AST 33 27  ALT 22 21  ALKPHOS 63 59  BILITOT 1.9* 1.4*  PROT 5.7* 5.3*  ALBUMIN 3.8 3.3*   No results for input(s): LIPASE, AMYLASE in the last 168 hours. No results for input(s): AMMONIA in the last 168 hours. CBC:  Recent Labs Lab 09/11/16 1200 09/13/16 0410 09/14/16 0325 09/16/16 0416  WBC 8.7 7.0 7.9 6.4  HGB 15.5* 14.0 14.4 15.1*  HCT 44.4 41.1 41.1 43.2  MCV 95.5 97.6 95.4 95.2  PLT 216 197 195 230   Cardiac Enzymes: No results for input(s): CKTOTAL, CKMB, CKMBINDEX, TROPONINI in the last 168 hours. BNP: Invalid input(s): POCBNP CBG: No results for input(s): GLUCAP in the last 168 hours.  Time coordinating discharge:  Greater than 30 minutes  Signed:  Daud Cayer, DO [facility] Pager: [phone] 09/17/2016, 10:11 AM

## 2016-09-17 NOTE — Progress Notes (Signed)
Lauren Maxwell to be D/C'd Skilled nursing facility per MD order. Discussed with the patient and all questions fully answered.    VVS, Skin clean, dry and intact without evidence of skin break down, no evidence of skin tears noted.  IV catheter discontinued intact. Site without signs and symptoms of complications. Dressing and pressure applied.  An After Visit Summary was printed and given to the patient.  Patient escorted via [place], and D/C  via private auto.  [name]  09/17/2016 2:45 PM

## 2016-09-17 NOTE — Clinical Social Work Note (Signed)
CSW facilitated patient discharge including contacting patient family and facility to confirm patient discharge plans. Clinical information faxed to facility and family agreeable with plan. Patient's family will transport to [place]. RN to call report prior to discharge [phone]. [employer]).  CSW will sign off for now as social work intervention is no longer needed. Please consult [place] again if new needs arise.  Dayton Scrape, [place]

## 2016-09-17 NOTE — Clinical Social Work Placement (Signed)
   CLINICAL SOCIAL WORK PLACEMENT  NOTE  Date:  09/17/2016  Patient Details  Name: Lauren Maxwell MRN: [id number] Date of Birth: Sep 27, 1931  Clinical Social Work is seeking post-discharge placement for this patient at the [place] level of care (*CSW will initial, date and re-position this form in  chart as items are completed):  Yes   Patient/family provided with [place] Work Department's list of facilities offering this level of care within the geographic area requested by the patient (or if unable, by the patient's family).  Yes   Patient/family informed of their freedom to choose among providers that offer the needed level of care, that participate in Medicare, Medicaid or managed care program needed by the patient, have an available bed and are willing to accept the patient.  Yes   Patient/family informed of [facility]'s ownership interest in [facility] and [facility], as well as of the fact that they are under no obligation to receive care at these facilities.  PASRR submitted to EDS on 09/16/16     PASRR number received on 09/16/16     Existing PASRR number confirmed on       FL2 transmitted to all facilities in geographic area requested by pt/family on 09/16/16     FL2 transmitted to all facilities within larger geographic area on       Patient informed that his/her managed care company has contracts with or will negotiate with certain facilities, including the following:        Yes   Patient/family informed of bed offers received.  Patient chooses bed at [facility] recommends and patient chooses bed at      Patient to be transferred to [facility] on 09/17/16.  Patient to be transferred to facility by Family     Patient family notified on 09/17/16 of transfer.  Name of family member notified:        PHYSICIAN Please prepare prescriptions     Additional Comment:     _______________________________________________ Candie Chroman, LCSW 09/17/2016, 11:33 AM

## 2016-09-17 NOTE — Progress Notes (Signed)
SUBJECTIVE:  Patient is still going in and out of Afib with RVR. Rates as high as 150-160. Feels weak.   OBJECTIVE:   Vitals:   Vitals:   09/16/16 1309 09/16/16 1731 09/16/16 2125 09/17/16 0408  BP: 116/77 113/74 103/68 117/70  Pulse: (!) 106  (!) 104 77  Resp: 17  18 18   Temp: 97.4 F (36.3 C)  97.6 F (36.4 C) 97.6 F (36.4 C)  TempSrc: Oral  Oral Oral  SpO2: 95%  96% 96%  Weight:      Height:       I&O's:    Intake/Output Summary (Last 24 hours) at 09/17/16 0948 Last data filed at 09/17/16 0152  Gross per 24 hour  Intake              120 ml  Output              700 ml  Net             -580 ml   TELEMETRY: Reviewed telemetry pt in atrial fibrillation:  PHYSICAL EXAM General: Elderly, frail, in no acute distress Head: Eyes PERRLA, No xanthomas.   Normal cephalic and atramatic  Lungs:   Clear bilaterally to auscultation and percussion. Heart:   Irregularly irregular S1 S2 Pulses are 2+ & equal. Abdomen: Bowel sounds are positive, abdomen soft and non-tender without masses Msk:  Back normal, normal gait. Normal strength and tone for age. Extremities:   No clubbing, cyanosis or edema.  DP +1 Neuro: Alert and oriented X 3. Psych:  Good affect, responds appropriately   LABS: Basic Metabolic Panel:  Recent Labs  09/16/16 0416 09/17/16 0359  NA 130* 129*  K 4.6 4.3  CL 95* 94*  CO2 24 23  GLUCOSE 91 103*  BUN 10 12  CREATININE 0.64 0.65  CALCIUM 9.6 9.6  MG 1.8 1.9   Liver Function Tests: No results for input(s): AST, ALT, ALKPHOS, BILITOT, PROT, ALBUMIN in the last 72 hours. No results for input(s): LIPASE, AMYLASE in the last 72 hours. CBC:  Recent Labs  09/16/16 0416  WBC 6.4  HGB 15.1*  HCT 43.2  MCV 95.2  PLT 230   Cardiac Enzymes: No results for input(s): CKTOTAL, CKMB, CKMBINDEX, TROPONINI in the last 72 hours. BNP: Invalid input(s): POCBNP D-Dimer: No results for input(s): DDIMER in the last 72 hours. Hemoglobin A1C: No results for  input(s): HGBA1C in the last 72 hours. Fasting Lipid Panel: No results for input(s): CHOL, HDL, LDLCALC, TRIG, CHOLHDL, LDLDIRECT in the last 72 hours. Thyroid Function Tests: No results for input(s): TSH, T4TOTAL, T3FREE, THYROIDAB in the last 72 hours.  Invalid input(s): FREET3 Anemia Panel: No results for input(s): VITAMINB12, FOLATE, FERRITIN, TIBC, IRON, RETICCTPCT in the last 72 hours. Coag Panel:   Lab Results  Component Value Date   INR 0.79 01/06/2012   INR 0.93 06/03/2010   INR 0.89 05/10/2010    RADIOLOGY: Dg Chest 2 View  Result Date: 09/15/2016 CLINICAL DATA:  Cough, history of COPD EXAM: CHEST  2 VIEW COMPARISON:  05/22/15 FINDINGS: Cardiac shadow is mildly enlarged but stable. Aortic calcifications and tortuosity it are again seen and stable. The lungs are hyperinflated consistent with COPD. Small bilateral pleural effusions are noted left greater than right new from the prior exam. No focal infiltrate is seen. Degenerative changes of the thoracic spine are noted as well as some compression deformity at T3 stable from the recent MRI. Stable T5 and T6 compression deformities are  noted as well IMPRESSION: COPD. Bilateral pleural effusions left greater than right without definitive infiltrate. Electronically Signed   By: Inez Catalina M.D.   On: 09/15/2016 09:46   Ct Head Wo Contrast  Result Date: 09/11/2016 CLINICAL DATA:  Pain after fall. EXAM: CT HEAD WITHOUT CONTRAST TECHNIQUE: Contiguous axial images were obtained from the base of the skull through the vertex without intravenous contrast. COMPARISON:  Apr 25, 2016 FINDINGS: Brain: No subdural, epidural, or subarachnoid hemorrhage. Ventricles and sulci are stable. Cerebellum, brainstem, and basal cisterns are normal. No acute cortical ischemia or infarct. Moderate white matter changes again identified. No mass, mass effect, or midline shift. Vascular: Atherosclerotic changes seen in the intracranial carotid arteries. Skull:  Normal. Negative for fracture or focal lesion. Sinuses/Orbits: No acute finding. Other: No other abnormalities. IMPRESSION: No acute intracranial abnormality. Electronically Signed   By: Dorise Bullion III M.D   On: 09/11/2016 11:00   Mr Brain Wo Contrast  Result Date: 09/13/2016 CLINICAL DATA:  Slurred speech and difficulty walking. EXAM: MRI HEAD WITHOUT CONTRAST TECHNIQUE: Multiplanar, multiecho pulse sequences of the brain and surrounding structures were obtained without intravenous contrast. COMPARISON:  Head CT 09/11/2016 FINDINGS: Brain: No acute infarct or intraparenchymal hemorrhage. The midline structures are normal. There is beginning confluent hyperintense T2-weighted signal within the periventricular and deep white matter, most often seen in the setting of chronic microvascular ischemia. No mass lesion or midline shift. No hydrocephalus or extra-axial fluid collection. Vascular: Major intracranial arterial and venous sinus flow voids are preserved. No evidence of chronic microhemorrhage or amyloid angiopathy. Skull and upper cervical spine: The visualized skull base, calvarium, upper cervical spine and extracranial soft tissues are normal. Sinuses/Orbits: No fluid levels or advanced mucosal thickening. No mastoid effusion. Normal orbits. IMPRESSION: 1. No acute intracranial abnormality. 2. Chronic microvascular ischemic disease. Electronically Signed   By: Ulyses Jarred M.D.   On: 09/13/2016 21:41   Mr Cervical Spine Wo Contrast  Result Date: 09/12/2016 CLINICAL DATA:  Initial evaluation for generalized weakness with tear earlier disc thrive, frequent falls. EXAM: MRI CERVICAL SPINE WITHOUT CONTRAST TECHNIQUE: Multiplanar, multisequence MR imaging of the cervical spine was performed. No intravenous contrast was administered. COMPARISON:  None available. FINDINGS: Alignment: Vertebral bodies normally aligned with preservation of the normal cervical lordosis. No listhesis or subluxation.  Vertebrae: Vertebral body heights preserved. No evidence for acute or chronic fracture. Signal intensity within the vertebral body bone marrow is normal. No worrisome osseous lesions. No marrow edema. Cord: Signal intensity within the cervical spinal cord is normal. Posterior Fossa, vertebral arteries, paraspinal tissues: Visualized portions of the brain and posterior fossa are unremarkable. Craniocervical junction within normal limits. Paraspinous and prevertebral soft tissues within normal limits. Normal intravascular flow voids present within the vertebral arteries bilaterally. Disc levels: C2-C3: Mild bilateral uncovertebral hypertrophy without significant stenosis. C3-C4: Mild diffuse disc bulge with disc desiccation and intervertebral disc space narrowing. Bulging disc flattens the ventral thecal sac and results in mild canal stenosis. Superimposed left-sided uncovertebral hypertrophy with resultant mild left foraminal narrowing. C4-C5: Diffuse degenerative disc bulge with intervertebral disc space narrowing and disc desiccation. Bilateral uncovertebral hypertrophy. Posterior disc bulge flattens and partially faces the ventral thecal sac results in mild canal stenosis. Resultant moderate to severe bilateral foraminal stenosis, slightly worse on the left. C5-C6: Mild diffuse degenerative disc bulge with disc desiccation and intervertebral disc space narrowing. Left greater than right uncovertebral hypertrophy. Resultant severe bilateral foraminal stenosis, left greater than right. Bulging disc flattens the ventral thecal  sac and results in mild canal stenosis. C6-C7: Diffuse degenerative disc bulge with disc desiccation and intervertebral disc space narrowing. Left greater than right uncovertebral hypertrophy. Moderate left with mild right foraminal stenosis. Superimposed ligamentum flavum hypertrophy at this level. Resultant moderate canal stenosis. C7-T1: Mild diffuse disc bulge with uncovertebral  hypertrophy. Mild left foraminal narrowing. No significant right foraminal stenosis. Central canal widely patent. Partially visualized upper thoracic spine unremarkable without significant stenosis or degenerative changes. IMPRESSION: 1. No evidence for acute traumatic injury within the cervical spine. 2. Moderate multilevel degenerative spondylolysis with mild to moderate diffuse canal narrowing extending from C3-4 through C6-7, greatest at the C6-7 level. 3. Severe bilateral foraminal stenosis due to disc bulge and uncovertebral disease at C4-5 and C5-6, with moderate left foraminal narrowing at C6-7. Electronically Signed   By: Jeannine Boga M.D.   On: 09/12/2016 16:15   Mr Thoracic Spine Wo Contrast  Result Date: 09/12/2016 CLINICAL DATA:  Persistent mid and low back pain. Multiple recent falls. EXAM: MRI THORACIC SPINE WITHOUT CONTRAST TECHNIQUE: Multiplanar, multisequence MR imaging of the thoracic spine was performed. No intravenous contrast was administered. COMPARISON:  Radiographs dated 07/07/2016 and lumbar MRI dated 06/25/2016 and chest CT dated 05/15/2013 FINDINGS: Alignment: There is accentuation of the thoracic kyphosis. No subluxation. Vertebrae: There is a subacute subtle benign-appearing compression fracture of the T3 vertebral body with slight residual edema in the vertebra. No protrusion of bone or disc material into the spinal canal. No bone destruction. There are old slight anterior wedge deformities of T5 and T6, unchanged since the prior CT scan. Degenerative changes of the vertebral endplates at [id number], chronic. Cord:  Normal.  Conus tip at T12-L1. Paraspinal and other soft tissues: There is a small left pleural effusion and a tiny right effusion. There is aneurysmal dilatation of the arch of the aorta to a diameter of 4.1 cm at the top of the ascending aorta. Aortic atherosclerosis. Disc levels: The discs throughout the thoracic spine demonstrate no protrusion or bulging. No  foraminal or spinal stenosis. IMPRESSION: 1. Subacute mild benign-appearing compression fracture of the T3 vertebral body. No neural impingement. 2. Old compression deformities of T5 and T6, stable. 3. Small left pleural effusion. 4. Slight aneurysmal dilatation of the top of the ascending thoracic aorta to a diameter of 4.1 cm. 5. Aortic atherosclerosis. Electronically Signed   By: Lorriane Shire M.D.   On: 09/12/2016 16:43   Mr Lumbar Spine Wo Contrast  Result Date: 09/12/2016 CLINICAL DATA:  History of falls.  Failure to thrive. EXAM: MRI LUMBAR SPINE WITHOUT CONTRAST TECHNIQUE: Multiplanar, multisequence MR imaging of the lumbar spine was performed. No intravenous contrast was administered. COMPARISON:  06/25/2016; 07/07/2016 FINDINGS: Despite efforts by the technologist and patient, severe motion artifact is present on today's exam and could not be eliminated. This reduces exam sensitivity and specificity. Segmentation: The lowest lumbar type non-rib-bearing vertebra is labeled as L5. Alignment: 8 mm anterolisthesis at L5-S1 associated with bilateral L5 pars defects. Vertebrae: Type 1 degenerative endplate findings eccentric to the right at L5-S1, fairly similar to the prior exam. Remote inferior endplate compressions at L5 and L2, no change from prior. There is some subtle lower sacral or sacrococcygeal edema and low-level presacral edema does not visibly present previously, then the possibility of a subtle fracture in the sacrococcygeal region is raised, although given the degree of motion artifact, I am not entirely certain. Conus medullaris: Extends to the T12-L1 level and appears normal. Paraspinal and other soft tissues: On  inversion recovery weighted images there is some equivocal edema in the paraspinal musculature of the lumbar spine. Disc levels: L1-2:  Unremarkable. L2-3:  No definite impingement. L3-4:  No definite impingement. L4-5: Stable mild left foraminal stenosis due to disc uncovering and  left facet arthropathy. L5-S1: Stable mild right and borderline left foraminal stenosis due to disc uncovering and facet arthropathy. IMPRESSION: 1. Subtle edema in the sacrococcygeal region could reflect a nondisplaced fracture. Motion artifact makes this finding not entirely certain. 2. Low-level paraspinal muscular edema, possible muscular strain. 3. No new compression fracture in the lumbar spine is identified. Old inferior endplate compressions at L5 and L2. 4. Stable mild impingement at L4-5 and L5-S1. 5. Stable appearance of bilateral pars defects at L5 with 8 mm anterolisthesis of L5 on S1. Electronically Signed   By: Van Clines M.D.   On: 09/12/2016 16:55    Assessment & Plan    1. Sinus Bradycardia - presented after a fall at home in which she reported weakness along her lower extremities. Reported hitting her head but denied a loss of consciousness. No prodromal symptoms noted. - HR had been variable in 40's - 70's while in the ED. This was not documented. No significant pauses on telemetry. Going in and out of atrial fibrillation with RVR.  This is consistent with tachybrady syndrome.  Seen by EP who recommended stopping long acting cardizem and using prn short acting Cardizem.  No documented brady during this hospitalization so no indication for pacemaker. It looks to me that she will need some maintenance rate control based on high HR noted on monitor. I would recommend resuming Cardizem CD 120 mg daily. May take short acting diltiazem 30 mg prn HR >120. Plans are for discharge to [facility] I think she is stable for discharge. Will need follow up with Dr. Daneen Schick as outpatient.  2. Hypotension - BP has been normal to elevated over the past 24 hours.   3. Paroxysmal Atrial Fibrillation - Converted to afib with RVR this am.  May have been triggered by hypokalemia.  Need to keep K > 4. - This patients CHA2DS2-VASc Score and unadjusted Ischemic Stroke Rate (% per year) is equal  to 7.2 % stroke rate/year from a score of 5 (CHF, HTN, Female, Age (2)). Continue Eliquis for anticoagulation. Reduced dosing in setting of weight of 46 kg and age of 5. - see recommendations in #1. Advise 30 day event monitor post discharge to monitor for bradycardia on meds.  4. Alcohol Use - consumes 3-5 oz of liquor per day. - currently on CIWA protocol. Per admitting team  5. Tobacco Use - cessation advised.  6.  Hypokalemia - replete per Primary team    Peter Martinique, MD  09/17/2016  9:48 AM

## 2016-09-17 NOTE — Progress Notes (Signed)
Attempted report to [facility]) x 2.

## 2016-09-20 ENCOUNTER — Non-Acute Institutional Stay (SKILLED_NURSING_FACILITY): Payer: Medicare Other | Admitting: Internal Medicine

## 2016-09-20 ENCOUNTER — Encounter: Payer: Self-pay | Admitting: Internal Medicine

## 2016-09-20 DIAGNOSIS — I519 Heart disease, unspecified: Secondary | ICD-10-CM

## 2016-09-20 DIAGNOSIS — E878 Other disorders of electrolyte and fluid balance, not elsewhere classified: Secondary | ICD-10-CM

## 2016-09-20 DIAGNOSIS — I5189 Other ill-defined heart diseases: Secondary | ICD-10-CM | POA: Insufficient documentation

## 2016-09-20 DIAGNOSIS — R296 Repeated falls: Secondary | ICD-10-CM | POA: Diagnosis not present

## 2016-09-20 DIAGNOSIS — I480 Paroxysmal atrial fibrillation: Secondary | ICD-10-CM

## 2016-09-20 NOTE — Assessment & Plan Note (Signed)
PT/OT at SNF °

## 2016-09-20 NOTE — Assessment & Plan Note (Signed)
Cardiology outpatient follow-up in the atrial fibrillation clinic Continue low-doseEliquis and calcium channel blockers

## 2016-09-20 NOTE — Patient Instructions (Signed)
BMET  will be requested to monitor the hypokalemia and hyponatremia. Debilitation will be addressed with physical therapy and improved nutrition.

## 2016-09-20 NOTE — Assessment & Plan Note (Signed)
Monitor electrolytes.

## 2016-09-20 NOTE — Progress Notes (Signed)
This is a comprehensive admission note to [facility] performed on this date less than 30 days from date of admission. Included are preadmission medical/surgical history;reconciled medication list; family history; social history and comprehensive review of systems.  Corrections and additions to the records were documented . Comprehensive physical exam was also performed. Additionally a clinical summary was entered for each active diagnosis pertinent to this admission in the Problem List to enhance continuity of care.  LD:[id number] Shaw MD  HPI: The patient was hospitalized 10/8-10/13/17, admitted after a fall. She states she was holding onto her walker with the left hand and reaching with her right when the right leg gave way and she fell. She states she struck her head but did not lose consciousness.  She has had recurrent falls over the last 4 months associated with right lower extremity weakness .She had been diagnosed with foraminal stenosis and herniated disc in the cervical spine. While hospitalized she was seen by Dr. Cyndy Freeze, neurosurgeon who did not feel severe bilateral stenosis secondary to disc bulging at C4-5-6 and C5-6 had caused the fall. She denies any cardiac or neuro prodrome prior to the fall. In the emergency room she was found to have a transitory bradycardia with pulse in the 40s.  Head CT showed no acute intracranial abnormalities.  During hospitalization she she was found to exhibit paroxysmal atrial fibrillation in the context of hypokalemia.Pacemaker placement was not felt to be indicated as telemetry revealed no significant pauses. She'll be followed in the atrial fibrillation clinic as an outpatient.  Cardizem CD 120 mg was prescribed daily with 30 mg every 6 hours as needed for paroxysmal atrial fibrillation.  She was found to be hypotensive which was attributed to volume depletion.  Past medical and surgical history: Includes hypertension, follicular  lymphoma, aortic aneurysm, and chronic anxiety/depression. She has had a thyroid nodule excised. TSH was therapeutic this admission.  Social history: Half a pack a day smoker and 5-6 ounces of vodka daily for multiple decades. She states she has multiple interest including nature and reading, but it sounds as if she's essentially homebound.  Family history: Reviewed  Review of systems: She describes chronic anxiety. She denies any vomiting or diarrhea to explain the potassium deficiency.  She states that she can tell when her heart "speeds up". She must sit down at that time. She occasionally has scant sputum production. Appetite is described as poor.  Constitutional: No fever,significant weight change, fatigue  Eyes: No redness, discharge, pain, vision change ENT/mouth: No nasal congestion,  purulent discharge, earache,change in hearing ,sore throat  Cardiovascular: No chest pain, paroxysmal nocturnal dyspnea, claudication, edema  Respiratory: No,hemoptysis, significant snoring,apnea   Gastrointestinal: No heartburn,dysphagia,abdominal pain, nausea / vomiting,rectal bleeding, melena,change in bowels Genitourinary: No dysuria,hematuria, pyuria,  incontinence, nocturia Dermatologic: No rash, pruritus, change in appearance of skin Neurologic: No dizziness,headache,syncope, seizures, numbness , tingling Endocrine: No change in hair/skin/ nails, excessive thirst, excessive hunger, excessive urination  Hematologic/lymphatic: No significant bruising, lymphadenopathy,abnormal bleeding Allergy/immunology: No itchy/ watery eyes, significant sneezing, urticaria, angioedema  Physical exam:  Pertinent or positive findings: She is thin and appears frail and suboptimally nourished. The left thyroid is not palpable. She has occasional pauses and occasional premature beats causing irregular rhythm. She has very minor low-grade rhonchi homogenously. Feet are cool to the touch and somewhat red. Pedal pulses  are decreased. Atrophy of the limbs.  She has mixed arthritic changes in the hands with medial deviations of the DIP joints of the  thumbs. There is accentuated curvature of the thoracic spine.  General appearance: no acute distress , increased work of breathing is present.   Lymphatic: No lymphadenopathy about the head, neck, axilla .Eyes: No conjunctival inflammation or lid edema is present. There is no scleral icterus. Ears:  External ear exam shows no significant lesions or deformities.   Nose:  External nasal examination shows no deformity or inflammation. Nasal mucosa are pink and moist without lesions ,exudates Oral exam: lips and gums are healthy appearing.There is no oropharyngeal erythema or exudate . Neck:  No thyromegaly, masses, tenderness noted.    Heart:  No gallop, murmur, click, rub .  Abdomen:Bowel sounds are normal. Abdomen is soft and nontender with no organomegaly, hernias,masses. GU: deferred  Extremities:  No cyanosis, clubbing,edema  Neurologic exam : Strength equal  in upper & lower extremities but decreased overall  Deep tendon reflexes are equal Skin: Warm & dry w/o tenting. No significant lesions or rash.  See clinical summary under each active problem in the Problem List with associated updated therapeutic plan

## 2016-09-21 ENCOUNTER — Telehealth: Payer: Self-pay | Admitting: Neurology

## 2016-09-21 NOTE — Telephone Encounter (Signed)
Patient daughter carole wants to have patient sooner than jan please let me know what you would liek me to do [phone]

## 2016-09-22 LAB — BASIC METABOLIC PANEL
BUN: 13 mg/dL (ref 4–21)
CREATININE: 0.6 mg/dL (ref 0.5–1.1)
Glucose: 89 mg/dL
Potassium: 4.2 mmol/L (ref 3.4–5.3)
SODIUM: 133 mmol/L — AB (ref 137–147)

## 2016-09-23 NOTE — Telephone Encounter (Signed)
It doesn't look like this patient has ever been seen at this office. She isn't scheduled for an appt either.  I would imagine the new referral would need to be sent to the doctor to evaluate for urgency? I'm not even sure what she needs seen for and if not urgent she will just need to be scheduled and put on a wait list.   Is there any other protocol for this Lauren Maxwell?

## 2016-09-23 NOTE — Telephone Encounter (Signed)
Patient daughter Lauren Maxwell wants to talk to someone about this today please call [phone]

## 2016-09-23 NOTE — Telephone Encounter (Signed)
I don't see an appointment for this patient.  Yes, referral and doctor to evaluate for urgency.

## 2016-09-28 ENCOUNTER — Ambulatory Visit (INDEPENDENT_AMBULATORY_CARE_PROVIDER_SITE_OTHER): Payer: Medicare Other

## 2016-09-28 ENCOUNTER — Telehealth: Payer: Self-pay | Admitting: Interventional Cardiology

## 2016-09-28 DIAGNOSIS — I480 Paroxysmal atrial fibrillation: Secondary | ICD-10-CM

## 2016-09-28 NOTE — Telephone Encounter (Signed)
Received incoming call to Triage with critical EKG reading from [facility]. Spoke with EKG Tech, Nicholette. HR sus 150-170 at 11:18 AM (CT). Event lasted for 2 minutes. Next reading at 12:05 PM pt in SR w/ pacs - HR 80. Will forward to Dr. Tamala Julian to advise.

## 2016-09-28 NOTE — Telephone Encounter (Signed)
Nira Conn is returning the call regarding [name].

## 2016-09-28 NOTE — Telephone Encounter (Signed)
Attempted to call Heather at [facility] back.  Phone rang several times with no answer and no VM option.  Believe she may have been returning a call to [place], RN to make appt at [facility] clinic which has now been addressed.  Will try again later to clarify this.

## 2016-09-28 NOTE — Telephone Encounter (Signed)
New message      Calling with an abn ekg reading.  Pt is wearing a monitor

## 2016-09-28 NOTE — Telephone Encounter (Signed)
Appt for hosp follow up in afib clinic scheduled for nov 1st at 1:30pm. Facility will notify son of appt date and time.

## 2016-09-28 NOTE — Telephone Encounter (Signed)
Called, spoke with Nurse Nira Conn at [facility] concerning pt. Dr. Marlou Porch (DOD) reviewed transmission from [place]. Dr. Marlou Porch recommended increasing dilitaizem 24 hr capsule to 180 mg every day. Asked nurse to please fax our office recent labs (BMP/CBC/MAGNESIUM), as these were orderied in discharge summary to be drawn on 09/20/16.

## 2016-09-28 NOTE — Telephone Encounter (Signed)
Left message with Arbie Cookey that I will have Dr. Posey Pronto review the referral to see when patient needs to come in.

## 2016-09-29 NOTE — Telephone Encounter (Signed)
Faxed over orders.  Called and spoke with Nira Conn and informed her fax was sent over.  Heather verbalized understanding. Called back again to clarify Diltiazem order with Nira Conn and the person that answered stated that Nira Conn was not on the unit now and took a message with medication instructions.

## 2016-09-29 NOTE — Telephone Encounter (Signed)
Called back to clarify fax and orders were received.  Spoke with Jackelyn Poling, [place] nurse and advised her that fax was sent and that pt is suppose to be taking Diltiazem 180mg  QD as suggested yesterday by Dr. Marlou Porch d/t monitor recording of HR 150-170.  Debbie verbalized understanding and will also make Heather aware.

## 2016-09-29 NOTE — Telephone Encounter (Signed)
Spoke with Nira Conn, nurse at [facility] [facility].  She states that she is concerned that pt is going from Diltiazem 30mg  PRN to Diltiazem 120mg  QD.  Advised that this was added on by Cardiology while pt was in the hospital.  Nira Conn asked that I please clarify this was MD as she is concerned about the large dose change.  Was also informed that pt is refusing to wear monitor and states that she does not have any heart issues.  Aram Beecham I will send message to Dr. Tamala Julian for review and advisement.

## 2016-09-29 NOTE — Telephone Encounter (Signed)
Debbie, RN at [facility] called back to clarify again that pt is to be on Diltiazem 180mg  vs the 120mg .  Advised Debbie that Dr. Tamala Julian was in agreement with Dr. Marlou Porch plan to increase Dilt to 180mg  QD.  Debbie verbalized understanding.

## 2016-09-29 NOTE — Telephone Encounter (Signed)
I agree with recommendation by Dr. Marlou Porch. Let us know if BP < 123XX123 mmHg systolic or HR < 50 bpm.

## 2016-09-30 ENCOUNTER — Ambulatory Visit (INDEPENDENT_AMBULATORY_CARE_PROVIDER_SITE_OTHER): Payer: Medicare Other | Admitting: Neurology

## 2016-09-30 ENCOUNTER — Encounter: Payer: Self-pay | Admitting: Neurology

## 2016-09-30 ENCOUNTER — Other Ambulatory Visit (INDEPENDENT_AMBULATORY_CARE_PROVIDER_SITE_OTHER): Payer: Medicare Other

## 2016-09-30 ENCOUNTER — Telehealth: Payer: Self-pay | Admitting: Interventional Cardiology

## 2016-09-30 VITALS — BP 130/80 | HR 69 | Ht 65.0 in | Wt 96.2 lb

## 2016-09-30 DIAGNOSIS — I2510 Atherosclerotic heart disease of native coronary artery without angina pectoris: Secondary | ICD-10-CM

## 2016-09-30 DIAGNOSIS — R292 Abnormal reflex: Secondary | ICD-10-CM

## 2016-09-30 DIAGNOSIS — R531 Weakness: Secondary | ICD-10-CM

## 2016-09-30 DIAGNOSIS — R471 Dysarthria and anarthria: Secondary | ICD-10-CM | POA: Diagnosis not present

## 2016-09-30 DIAGNOSIS — G1220 Motor neuron disease, unspecified: Secondary | ICD-10-CM

## 2016-09-30 DIAGNOSIS — M6250 Muscle wasting and atrophy, not elsewhere classified, unspecified site: Secondary | ICD-10-CM

## 2016-09-30 DIAGNOSIS — R253 Fasciculation: Secondary | ICD-10-CM

## 2016-09-30 LAB — SEDIMENTATION RATE: Sed Rate: 20 mm/hr (ref 0–30)

## 2016-09-30 LAB — CK: CK TOTAL: 55 U/L (ref 7–177)

## 2016-09-30 NOTE — Telephone Encounter (Signed)
As stated before, I agree with the change.

## 2016-09-30 NOTE — Telephone Encounter (Signed)
Spoke with Danae Chen, nurse at [facility] and she states that pt refused to take her Diltiazem this morning because she wanted to know why her dose had went from 30mg  PRN to 180mg  QD.  Gave explanation to Rodessa of why pt needed the higher dose of Dilt.  Danae Chen said she will explain to pt and try to get her to take the medication.  Danae Chen will call back if pt hasn't taken med by 3pm.

## 2016-09-30 NOTE — Telephone Encounter (Signed)
°  New Prob   Calling regarding an abnormal EKG.

## 2016-09-30 NOTE — Progress Notes (Signed)
[facility] Clinic Note - Initial Visit   Date: 09/30/16  Lauren Maxwell MRN: [id number] DOB: 1931/07/08   Dear Dr. Brigitte Pulse:  Thank you for your kind referral of Lauren Maxwell for consultation of generalized weakness, especially right leg. Although her history is well known to you, please allow [place] to reiterate it for the purpose of our medical record. The patient was accompanied to the clinic by son who also provides collateral information.     History of Present Illness: Lauren Maxwell is a 82 y.o. left-handed Caucasian female with hypertension, depression, lymphoma in remission, tobacco use, alcohol use, atrial fibrillation presenting for evaluation of right leg weakness.    Starting in Spring 2017, she was walking indendently but started having a little limp and imbalance.  Occasionally, she would reach out to her son for support when ambulating. Slowly, her weakness progressed and by July she stopped driving because she was unable to move her foot to control the pedals. She suffered a fall in the summer which prompted her to see her PCP.  She was diagnosed with right foot drop and had MRI lumbar spine which showed degenerative changes and disc protrusion with mild foraminal narrowing, worse on the left at L3-4 and L4-5.  She was referred to Dr. Joya Salm who discussed surgical and nonsurgical management options, and patient decided to pursue nonsurgical therapies. She completed physical therapy, without any improvement.  Over the following few weeks, she has gradually worsening weakness which started with foot drop and progressed to involve her entire right leg and into her left leg. During the summer she also started using a walker. On 09/12/2016, she presented to [facility] after suffering a fall and hitting her head. There was no loss of consciousness.  During her admission, she had extensive neurological testing including imaging of the entire  neuraxis which showed multilevel degenerative spondylosis with canal and foraminal narrowing, however, the degree did not explain severity of her weakness. She was referred to see me for further evaluation and electrodiagnostic testing.  She denies weakness of the arms, but has noticed that her muscle bulk is much less.  She denies difficulty swallowing. Her son has noticed that her voice slurs more often and is hoarse, which is new.  No shortness of breath.  She sleeps on one pillow. She occasionally has muscle twitch in her right thigh.  No muscle cramps or pain.  Mood is down because she wishes to be at home instead of the rehabilitation facility.  Her son feels that she has depressed mood because of living alone, and has started to drink alcohol again, causing reduced appetite and poor nutrition.  She has lost almost 10 pounds since the summer.  She has been drinking 3-5 oz vodka nightly for the past 4-5 years.  About 30 years ago, she reports having history of alcohol abuse for about 10 years (mid-30s - 80s) after going through a divorce and battling depression.  Out-side paper records, electronic medical record, and images have been reviewed where available and summarized as:  Lab Results  Component Value Date   TSH 2.227 09/12/2016   Lab Results  Component Value Date   VITAMINB12 488 09/12/2016   MRI brain wo contrast 09/13/2016: 1. No acute intracranial abnormality. 2. Chronic microvascular ischemic disease.  MRI cervical spine 09/12/2016: 1. No evidence for acute traumatic injury within the cervical spine. 2. Moderate multilevel degenerative spondylolysis with mild to moderate diffuse canal narrowing extending from C3-4  through C6-7, greatest at the C6-7 level. 3. Severe bilateral foraminal stenosis due to disc bulge and uncovertebral disease at C4-5 and C5-6, with moderate left foraminal narrowing at C6-7.  MRI thoracic spine wo contrast 09/12/2016: 1. Subacute mild  benign-appearing compression fracture of the T3 vertebral body. No neural impingement. 2. Old compression deformities of T5 and T6, stable. 3. Small left pleural effusion. 4. Slight aneurysmal dilatation of the top of the ascending thoracic aorta to a diameter of 4.1 cm. 5. Aortic atherosclerosis.  MRI lumbar spine wo contrast 09/12/2016: 1. Subtle edema in the sacrococcygeal region could reflect a nondisplaced fracture. Motion artifact makes this finding not entirely certain. 2. Low-level paraspinal muscular edema, possible muscular strain. 3. No new compression fracture in the lumbar spine is identified. Old inferior endplate compressions at L5 and L2. 4. Stable mild impingement at L4-5 and L5-S1. 5. Stable appearance of bilateral pars defects at L5 with 8 mm anterolisthesis of L5 on S1.   Past Medical History:  Diagnosis Date  . Anxiety and depression   . Ascending aortic aneurysm ([place])   . Follicular lymphoma ([place])   . Hypertension   . Smoking     Past Surgical History:  Procedure Laterality Date  . colonoscopy    . thyroid nodule excision    . TONSILLECTOMY    . TUBAL LIGATION       Medications:  Outpatient Encounter Prescriptions as of 09/30/2016  Medication Sig  . diltiazem (CARDIZEM) 30 MG tablet Take 1 tablet (30 mg total) by mouth every 6 (six) hours as needed (HR > 120).  Marland Kitchen ELIQUIS 2.5 MG TABS tablet take 1 tablet by mouth twice a day  . lisinopril (PRINIVIL,ZESTRIL) 20 MG tablet Take 20 mg by mouth 2 (two) times daily.   . Multiple Vitamin (MULTIVITAMIN) tablet Take 1 tablet by mouth every other day. Centrum Silver   . sodium chloride (OCEAN) 0.65 % nasal spray Place 1 spray into the nose as needed. For nasal congestion.  . thiamine 100 MG tablet Take 1 tablet (100 mg total) by mouth daily.  Marland Kitchen diltiazem (CARDIZEM CD) 120 MG 24 hr capsule Take 1 capsule (120 mg total) by mouth daily. (Patient not taking: Reported on 09/30/2016)   No facility-administered  encounter medications on file as of 09/30/2016.      Allergies:  Allergies  Allergen Reactions  . Codeine Nausea And Vomiting    Family History: Family History  Problem Relation Age of Onset  . Cancer Mother 66    colon cancer  . Cancer Maternal Aunt     multiple aunts have colon cancer  . Lung cancer Brother   . Other Brother     post tramactic stress disorder    Social History: Social History  Substance Use Topics  . Smoking status: Current Every Day Smoker    Packs/day: 0.50    Years: 62.00  . Smokeless tobacco: Never Used  . Alcohol use 25.2 oz/week    42 Shots of liquor per week     Comment: 5-6 oz vodka yesterday afternoon   Social History   Social History Narrative   Lives alone in a one story home.  Has 3 children.  Retired from Personal assistant.  Education: high school.    Review of Systems:  CONSTITUTIONAL: No fevers, chills, night sweats, +weight loss.   EYES: No visual changes or eye pain ENT: No hearing changes.  No history of nose bleeds.   RESPIRATORY: No cough, wheezing and shortness of  breath.   CARDIOVASCULAR: Negative for chest pain, and palpitations.   GI: Negative for abdominal discomfort, blood in stools or black stools.  No recent change in bowel habits.   GU:  No history of incontinence.   MUSCLOSKELETAL: +history of joint pain or swelling.  No myalgias.   SKIN: Negative for lesions, rash, and itching.   HEMATOLOGY/ONCOLOGY: Negative for prolonged bleeding, bruising easily, and swollen nodes.  +history of cancer.   ENDOCRINE: Negative for cold or heat intolerance, polydipsia or goiter.   PSYCH:  +depression or anxiety symptoms.   NEURO: As Above.   Vital Signs:  BP 130/80   Pulse 69   Ht '5\' 5"'$  (1.651 m)   Wt 96 lb 4 oz (43.7 kg)   SpO2 96%   BMI 16.02 kg/m   General Medical Exam:   General: Thin and frail-appearingg, comfortable, sitting in wheelchair.   Eyes/ENT: see cranial nerve examination.   Neck: No masses appreciated.  Full  range of motion without tenderness.  No carotid bruits. Respiratory:  Clear to auscultation, good air entry bilaterally.   Cardiac:  Regular rate and rhythm, no murmur.   Extremities: There is skeletal deformity of the thumbs due to arthritis. Skin:  No rashes or lesions.  Neurological Exam: MENTAL STATUS including orientation to time, place, person, recent and remote memory, attention span and concentration, language, and fund of knowledge is normal.  Speech  slightly slowed and hoarse, very mild dysarthria.  CRANIAL NERVES: II:  No visual field defects.  Unremarkable fundi.   III-IV-VI: Pupils equal round and reactive to light.  Normal conjugate, extra-ocular eye movements in all directions of gaze.  No nystagmus.  No ptosis V:  Normal facial sensation.  Jaw jerk is present.   VII:  Normal facial symmetry and movements.  Snout and Myerson's sign is present.   VIII:  Normal hearing and vestibular function.   IX-X:  Normal palatal movement.   XI:  Normal shoulder shrug and head rotation.   XII:  Tongue movements are slowed and although she is able to tuck her tongue into her cheek, there is weakness (4/5) with strength testing.  Active fasciculations are seen over the tongue.   MOTOR:  Generalized loss of muscle bulk, prominent atrophy of the lower legs (quadriceps, TA, MG) and intrinsic hand muscles on the right.  Active fasiculations over the right triceps, deltoid, biceps, forearm muscles, and quadriceps.  Rare fasciculations are seen on the left side.  No pronator drift.  Tone is normal.   Neck flexion is 4/5, neck extension 5/5  Right Upper Extremity:    Left Upper Extremity:    Deltoid  5/5   Deltoid  5/5   Biceps  5/5   Biceps  5/5   Triceps  5/5   Triceps  5/5   Wrist extensors  5/5   Wrist extensors  5/5   Wrist flexors  5/5   Wrist flexors  5/5   Finger extensors  5/5   Finger extensors  5/5   Finger flexors  4/5   Finger flexors  5/5   Dorsal interossei  4/5   Dorsal  interossei  5-/5   Abductor pollicis  3/5   Abductor pollicis  5/5   Tone (Ashworth scale)  0  Tone (Ashworth scale)  0   Right Lower Extremity:    Left Lower Extremity:    Hip flexors  3-/5   Hip flexors  4-/5   Hip extensors  3/5   Hip  extensors  4/5   Knee flexors  3/5   Knee flexors  4/5   Knee extensors  2+/5   Knee extensors  5-/5   Dorsiflexors  2-/5   Dorsiflexors  4/5   Plantarflexors  2/5   Plantarflexors  4/5   Toe extensors  1/5   Toe extensors  4/5   Toe flexors  1/5   Toe flexors  4/5   Tone (Ashworth scale)  0  Tone (Ashworth scale)  0   MSRs:  Right                                                                 Left brachioradialis 3+  brachioradialis 2+  biceps 3+  biceps 2+  triceps 3+  triceps 2+  patellar 3+  patellar 3+  ankle jerk 0  ankle jerk 0  Hoffman no  Hoffman no  plantar response down  plantar response down   SENSORY:  Vibration is absent at the great toe bilaterally and pin prick is mildly reduced in the feet.  Sensation elsewhere is intact to light touch, pinprick, vibration, and proprioception.     COORDINATION/GAIT: Normal finger-to- nose-finger.  Unable to perform heel-to-shin.  Finger tapping intact.  Impaired toe tapping due to weakness. She is unable to rise from a chair without using arms.  Gait not testing as she is predominately wheelchair bound.   IMPRESSION: Ms. Fresquez is a 82 year-old female referred to see me for generalized muscle weakness, most notably in the lower extremities (R>L).  Her symptoms have been gradually progressive since the summer of 2017 which started with her right foot drop, and now it involves bilateral leg weakness, distal hand weakness, and dysarthria. Her exam shows generalized loss of muscle bulk, paraparesis, intrinsic hand muscle weakness, hyperreflexia on the right, pathological facial reflexes, facial weakness, and active tongue fasciculations as well as limb fasciculations on the right. With the presence of  upper motor neuron and lower motor neuron findings involving the bulbar, cervical, and lumbosacral regions, motor neuron disease is most likely.  I have personally reviewed her MRI, which although shows multilevel degenerative changes with spondylosis causing canal stenosis and disc degeneration causing foraminal stenosis, the degree of her clinical weakness is out of proportion to what is seen on imaging.  Laboratory testing for external injury to motor neurons be checked and she will have electrodiagnostic testing to look for low motor neuron dysfunction.  If her lab testing returns normal, the diagnosis of amyotrophic lateral sclerosis is definite.  I had a lengthy discussion regarding the possibilities, including ALS, and it was explained that ALS is a diagnosis of exclusion, therefore, additional testing is necessary.   PLAN/RECOMMENDATIONS:  1.  Check ESR, CK, aldolase, SPEP, UPEP, copper 2.  NCS/EMG of the right side - MND protocol.  Caution will be taken during needle electrode examination as she is on anticoagulant therapy  I will see her back in the clinic after her testing   The duration of this appointment visit was 70 minutes of face-to-face time with the patient.  Greater than 50% of this time was spent in counseling, explanation of diagnosis, planning of further management, and coordination of care.   Thank you for allowing me to participate in patient's care.  If I can answer any additional questions, I would be pleased to do so.    Sincerely,    Donika K. Posey Pronto, DO

## 2016-09-30 NOTE — Telephone Encounter (Signed)
Spoke with [facility] this morning and at 6:59AMEST pt had an episode of Aflutter with variable conduction with PAC's and PVC's with HR of 170bpm.  Another monitor tracing has been called since then (see note below).  Awaiting call back to see if pt has taken Diltiazem this morning.  Will route to Dr. Tamala Julian in the meantime to make him aware.

## 2016-09-30 NOTE — Patient Instructions (Signed)
1.  NCS/EMG of the right arm and leg on November 9th at 12:30pm.  Please arrive 15 min prior to appointment 2.  Check blood work 3.  Continue physical therapy  We will schedule the

## 2016-09-30 NOTE — Telephone Encounter (Signed)
Mafisa with [employer] calling about patient's monitor. Patient had an episode of A. Fib with RVR with PVC and PAC 160 beats per minutes at 10:47 am [place] time. Patient's HR at this time is in the 140's. Mafisa is going to be sending a fax. Patient was called by [employer] - patient reported rapid heart beat while sitting. [facility] to see if patient is taking Diltiazem at the correct dose 180 mg. Left message for [facility] nurse to call back.

## 2016-10-03 LAB — ALDOLASE: Aldolase: 4.2 U/L (ref <=8.1)

## 2016-10-03 LAB — COPPER, SERUM: Copper: 144 ug/dL (ref 72–166)

## 2016-10-04 LAB — PROTEIN ELECTROPHORESIS, SERUM
ALPHA-2-GLOBULIN: 0.7 g/dL (ref 0.5–0.9)
Albumin ELP: 3.2 g/dL — ABNORMAL LOW (ref 3.8–4.8)
Alpha-1-Globulin: 0.3 g/dL (ref 0.2–0.3)
BETA 2: 0.2 g/dL (ref 0.2–0.5)
BETA GLOBULIN: 0.3 g/dL — AB (ref 0.4–0.6)
GAMMA GLOBULIN: 0.4 g/dL — AB (ref 0.8–1.7)
Total Protein, Serum Electrophoresis: 5.2 g/dL — ABNORMAL LOW (ref 6.1–8.1)

## 2016-10-04 LAB — IMMUNOFIXATION ELECTROPHORESIS
IgA: 80 mg/dL — ABNORMAL LOW (ref 81–463)
IgG (Immunoglobin G), Serum: 398 mg/dL — ABNORMAL LOW (ref 694–1618)
IgM, Serum: 25 mg/dL — ABNORMAL LOW (ref 48–271)

## 2016-10-05 ENCOUNTER — Ambulatory Visit (HOSPITAL_COMMUNITY)
Admission: RE | Admit: 2016-10-05 | Discharge: 2016-10-05 | Disposition: A | Discharge status: Home / Self Care | Payer: PRIVATE HEALTH INSURANCE | Source: Ambulatory Visit | Attending: Nurse Practitioner | Admitting: Nurse Practitioner

## 2016-10-05 ENCOUNTER — Encounter (HOSPITAL_COMMUNITY): Payer: Self-pay | Admitting: Nurse Practitioner

## 2016-10-05 VITALS — BP 122/78 | HR 77 | Ht 65.0 in | Wt 97.4 lb

## 2016-10-05 DIAGNOSIS — F419 Anxiety disorder, unspecified: Secondary | ICD-10-CM | POA: Diagnosis not present

## 2016-10-05 DIAGNOSIS — R531 Weakness: Secondary | ICD-10-CM | POA: Insufficient documentation

## 2016-10-05 DIAGNOSIS — R42 Dizziness and giddiness: Secondary | ICD-10-CM | POA: Insufficient documentation

## 2016-10-05 DIAGNOSIS — R634 Abnormal weight loss: Secondary | ICD-10-CM | POA: Insufficient documentation

## 2016-10-05 DIAGNOSIS — F329 Major depressive disorder, single episode, unspecified: Secondary | ICD-10-CM | POA: Diagnosis not present

## 2016-10-05 DIAGNOSIS — I712 Thoracic aortic aneurysm, without rupture: Secondary | ICD-10-CM | POA: Insufficient documentation

## 2016-10-05 DIAGNOSIS — I2510 Atherosclerotic heart disease of native coronary artery without angina pectoris: Secondary | ICD-10-CM | POA: Insufficient documentation

## 2016-10-05 DIAGNOSIS — Z87891 Personal history of nicotine dependence: Secondary | ICD-10-CM | POA: Diagnosis not present

## 2016-10-05 DIAGNOSIS — C8590 Non-Hodgkin lymphoma, unspecified, unspecified site: Secondary | ICD-10-CM | POA: Diagnosis not present

## 2016-10-05 DIAGNOSIS — Z7901 Long term (current) use of anticoagulants: Secondary | ICD-10-CM | POA: Diagnosis not present

## 2016-10-05 DIAGNOSIS — I480 Paroxysmal atrial fibrillation: Secondary | ICD-10-CM | POA: Insufficient documentation

## 2016-10-05 DIAGNOSIS — I5032 Chronic diastolic (congestive) heart failure: Secondary | ICD-10-CM | POA: Diagnosis not present

## 2016-10-05 DIAGNOSIS — I4892 Unspecified atrial flutter: Secondary | ICD-10-CM | POA: Diagnosis not present

## 2016-10-05 DIAGNOSIS — I110 Hypertensive heart disease with heart failure: Secondary | ICD-10-CM | POA: Diagnosis not present

## 2016-10-05 DIAGNOSIS — I4891 Unspecified atrial fibrillation: Secondary | ICD-10-CM | POA: Diagnosis not present

## 2016-10-05 LAB — PROTEIN ELECTROPHORESIS,RANDOM URN
Creatinine, Urine: 62 mg/dL (ref 20–320)
Protein Creatinine Ratio: 145 mg/g{creat} (ref 21–161)
Total Protein, Urine: 9 mg/dL (ref 5–24)

## 2016-10-05 LAB — IMMUNOFIXATION INTE

## 2016-10-05 MED ORDER — LISINOPRIL 20 MG PO TABS: 10.0000 mg | ORAL_TABLET | Freq: Two times a day (BID) | ORAL | 3 refills | Status: DC

## 2016-10-05 MED ORDER — DILTIAZEM HCL ER BEADS 120 MG PO CP24: 120.0000 mg | ORAL_CAPSULE | Freq: Every day | ORAL | 3 refills | Status: DC

## 2016-10-05 NOTE — Patient Instructions (Signed)
Your physician has recommended you make the following change in your medication:  1)Stop Cardizem 180mg  2)Start Cardizem 120mg  once a day at bedtime 3)Decrease lisinopril to 10mg  twice a day (1/2 tablet of the 20mg )

## 2016-10-05 NOTE — Progress Notes (Signed)
Primary Care Physician: Mayra Neer, MD Referring Physician: Dr. Bufford Lope Mayberry is a 82 y.o. female that is in the afib clinic for follow up of recent hospitalization.. She has h/o depression, lymphoma, hypertension, tobacco and alcohol use, chronic diastolic CHF,CAD noted on CT in 2014, dilated ascending aorta, and atrial fibrillation presented to [facility] on 09/11/2016 for evaluation of generalized weakness, dizziness, and fall. She has had progressive weakness of rt leg for some months and was dx with foraminal stenosis and  herniated disc in the cervical spine.She was on meds cardizem 120 mg a day, eliquis 2.5 mg bid and lisinopril 20 bid. Pt reports that she has been more depressed at home and was drinking 3-5 ozs of vodka a day, had lost her appetite and has lost 10 lbs over the last 3 months. She had intermittent afib in the hospital, was bradycardic with heart rates in the 40's and was noted to have low potassium and magnesium. Cardizem was held and she was discharged on cardizem 30 mg to use as needed for fast heart rate. 30 day event monitor was also arranged as an outpt.  Since she has been wearing the event monitor, no significant brady has been noted but she has had paroxysmal  afib with RVR with heart rate up to 170 bpm. The episodes are of short duration, pt does not feel them but does  have intermittent spells of weakness, ? if afib related. She was encouraged to report her symptoms with the cardiac event device to see if correlates with rhythm..She was placed back on Cardizem 120 mg qd. She had another episode to be reported with RVR  and Cardizem was increased to 180 mg qd which pt takes at 2pm. She is at [facility] since discharge and hopes to be able to return home.  The daughter accompanies her mother today and she is concerned that she seemed to be so much stronger off the long acting Cardizem in the hospital and now has had progressive weakness in the nursing  facility that she can barely do P.T. She questions if the Cardizem is contributing. Pt does continue  to smoke but has not had alcohol since coming in the hospital. She is also seeing neurology for weakness and is undergoing evaluation.Has h/o anxiety.  Today, she denies symptoms of chest pain, shortness of breath, orthopnea, PND, lower extremity edema  presyncope, syncope, or neurologic sequela. Positive for weakness, lightheadedness, chronic congested cough. The patient is tolerating medications without difficulties and is otherwise without complaint today.   Past Medical History:  Diagnosis Date  . Anxiety and depression   . Ascending aortic aneurysm ([place])   . Follicular lymphoma ([place])   . Hypertension   . Smoking    Past Surgical History:  Procedure Laterality Date  . colonoscopy    . thyroid nodule excision    . TONSILLECTOMY    . TUBAL LIGATION      Current Outpatient Prescriptions  Medication Sig Dispense Refill  . diltiazem (TIAZAC) 120 MG 24 hr capsule Take 1 capsule (120 mg total) by mouth daily. 30 capsule 3  . ELIQUIS 2.5 MG TABS tablet take 1 tablet by mouth twice a day 60 tablet 6  . lisinopril (PRINIVIL,ZESTRIL) 20 MG tablet Take 0.5 tablets (10 mg total) by mouth 2 (two) times daily. 30 tablet 3  . Multiple Vitamin (MULTIVITAMIN) tablet Take 1 tablet by mouth every other day. Centrum Silver     . sodium chloride (  OCEAN) 0.65 % nasal spray Place 1 spray into the nose as needed. For nasal congestion.    . thiamine 100 MG tablet Take 1 tablet (100 mg total) by mouth daily. 30 tablet 0   No current facility-administered medications for this encounter.     Allergies  Allergen Reactions  . Codeine Nausea And Vomiting    Social History   Social History  . Marital status: Divorced    Spouse name: N/A  . Number of children: N/A  . Years of education: N/A   Occupational History  . Not on file.   Social History Main Topics  . Smoking status: Former Smoker     Packs/day: 0.50    Years: 62.00    Types: Cigarettes  . Smokeless tobacco: Never Used     Comment: Quit September 10, 2016  . Alcohol use Yes     Comment: 2-5 oz vodka daily   . Drug use: No  . Sexual activity: Not on file     Comment: MPOA son Yeimy Niziol   Other Topics Concern  . Not on file   Social History Narrative   Currently living in [place] rehab. Lives alone in a one story home.  Has 3 children.  Retired from Personal assistant.  Education: high school.    Family History  Problem Relation Age of Onset  . Cancer Mother 30    colon cancer  . Cancer Maternal Aunt     multiple aunts have colon cancer  . Lung cancer Brother   . Other Brother     post tramactic stress disorder    ROS- All systems are reviewed and negative except as per the HPI above  Physical Exam: Vitals:   10/05/16 1330  BP: 122/78  Pulse: 77  Weight: 97 lb 6.4 oz (44.2 kg)  Height: 5\' 5"  (1.651 m)    GEN- The patient is well appearing, alert and oriented x 3 today.   Head- normocephalic, atraumatic Eyes-  Sclera clear, conjunctiva pink Ears- hearing intact Oropharynx- clear Neck- supple, no JVP Lymph- no cervical lymphadenopathy Lungs- Clear to ausculation bilaterally, normal work of breathing Heart- Regular rate and rhythm, no murmurs, rubs or gallops, PMI not laterally displaced GI- soft, NT, ND, + BS Extremities- no clubbing, cyanosis, or edema MS- no significant deformity or atrophy Skin- no rash or lesion Psych- euthymic mood, full affect Neuro- strength and sensation are intact  EKG-SR with marked sinus arrhythmia, v rate is 77 bpm, pr int 184 bpm, qtc 452 ms Epic Records reviewed Event monitor notification strips showing Afib with rvr reviewed   Assessment and Plan: 1. Paroxysmal afib with RVR Slow v response while in hospital but has not had any brady reported on event monitor, but has had episodes of afib with rvr Cardizem 120 mg was restarted and actually recently increased to  180 mg qd Daughter has concerns that Cardizem is increasing her weakness, I ham not sure if this is the case or if rvr is making her weak or weakness is multifactorial, but will decrease dose back to 120 mg a day and move to bedtime Continue wearing event monitor and be sure to mark spells of lightheadedness to see if correlates with arrythmia Continue xarelto at 2.5 mg bid for chadsvasc score of at at least 4. May have to consider for an antiarrythmic, I think tikosyn is not a good option because if she goes home and starts back daily alcohol and not eating well, she may have repeat  issues with hypokalemia and hypomagnesemia  She would have to have a PFT if considered for amiodarone  2.Weakness Possibly multifactorial Continue PT Continue evaluation with Neurology Decrease lisinopril to 10 mg bid, with weight loss of 10 lbs, may be overmedicated for BP   F/u in one week in afib clinic  [place]. Yissel Habermehl, [facility] [address] [place], [place] [address] [phone]

## 2016-10-10 ENCOUNTER — Non-Acute Institutional Stay (SKILLED_NURSING_FACILITY): Payer: Medicare Other | Admitting: Nurse Practitioner

## 2016-10-10 ENCOUNTER — Encounter: Payer: Self-pay | Admitting: Nurse Practitioner

## 2016-10-10 DIAGNOSIS — E878 Other disorders of electrolyte and fluid balance, not elsewhere classified: Secondary | ICD-10-CM | POA: Diagnosis not present

## 2016-10-10 DIAGNOSIS — I5032 Chronic diastolic (congestive) heart failure: Secondary | ICD-10-CM | POA: Diagnosis not present

## 2016-10-10 DIAGNOSIS — R531 Weakness: Secondary | ICD-10-CM

## 2016-10-10 DIAGNOSIS — I480 Paroxysmal atrial fibrillation: Secondary | ICD-10-CM | POA: Diagnosis not present

## 2016-10-10 MED ORDER — THIAMINE HCL 100 MG PO TABS: 100.0000 mg | ORAL_TABLET | Freq: Every day | ORAL | 0 refills | Status: AC

## 2016-10-10 MED ORDER — LISINOPRIL 20 MG PO TABS: 10.0000 mg | ORAL_TABLET | Freq: Two times a day (BID) | ORAL | 0 refills | Status: AC

## 2016-10-10 MED ORDER — APIXABAN 2.5 MG PO TABS: 2.5000 mg | ORAL_TABLET | Freq: Two times a day (BID) | ORAL | 0 refills | Status: AC

## 2016-10-10 MED ORDER — DILTIAZEM HCL ER BEADS 120 MG PO CP24: 120.0000 mg | ORAL_CAPSULE | Freq: Every day | ORAL | 0 refills | Status: AC

## 2016-10-10 NOTE — Progress Notes (Signed)
Nursing Home Location:  [facility]  Place of Service: SNF (31)  PCP: Mayra Neer, MD  Allergies  Allergen Reactions  . Codeine Nausea And Vomiting    Chief Complaint  Patient presents with  . Discharge Note    HPI:  Patient is a 82 y.o. female seen today at [facility] for discharge home. Pt with hx of  depression, lymphoma, hypertension, tobacco and alcohol use, chronic diastolic CHF, CAD noted on CT in 2014, dilated ascending aorta, and atrial fibrillation presented to [facility] on 09/11/2016 for evaluation of generalized weakness, dizziness, and fall. She reported walking around her home when she fell while using her walker. Developed weakness along her lower extremities and fell backwards. Pt has been at [facility] for rehab.  While in hospital pts HR variable. HR 40-70s, being followed by cardiologist and has 30 days heart monitor, however noncompliant with wearing monitor at [facility]. Takes it off frequently despite education regarding compliance.  Pt with progressive weakness and FTT, no significant findings on workup during hospitalization. Currently being followed by neurology due to neuromuscular disease.  Pt lives alone and is adamant about going back home.  Son and pt report she is stronger now than prior to admission.  Review of Systems:  Review of Systems  Constitutional: Positive for appetite change (poor appetite, has improved slightly). Negative for chills and fever.  HENT: Negative for tinnitus.   Respiratory: Negative for cough and shortness of breath.   Cardiovascular: Positive for palpitations. Negative for chest pain and leg swelling.  Gastrointestinal: Negative for abdominal pain, constipation and diarrhea.  Genitourinary: Negative for dysuria, frequency and urgency.  Musculoskeletal: Positive for gait problem. Negative for back pain and myalgias.  Skin: Negative.   Neurological: Positive for weakness. Negative for dizziness and  headaches.  Psychiatric/Behavioral:       Anxiety    Past Medical History:  Diagnosis Date  . Anxiety and depression   . Ascending aortic aneurysm ([place])   . Follicular lymphoma ([place])   . Hypertension   . Smoking    Past Surgical History:  Procedure Laterality Date  . colonoscopy    . thyroid nodule excision    . TONSILLECTOMY    . TUBAL LIGATION     Social History:   reports that she has quit smoking. Her smoking use included Cigarettes. She has a 31.00 pack-year smoking history. She has never used smokeless tobacco. She reports that she drinks alcohol. She reports that she does not use drugs.  Family History  Problem Relation Age of Onset  . Cancer Mother 73    colon cancer  . Cancer Maternal Aunt     multiple aunts have colon cancer  . Lung cancer Brother   . Other Brother     post tramactic stress disorder    Medications: Patient's Medications  New Prescriptions   No medications on file  Previous Medications   DILTIAZEM (TIAZAC) 120 MG 24 HR CAPSULE    Take 1 capsule (120 mg total) by mouth daily.   ELIQUIS 2.5 MG TABS TABLET    take 1 tablet by mouth twice a day   LISINOPRIL (PRINIVIL,ZESTRIL) 20 MG TABLET    Take 0.5 tablets (10 mg total) by mouth 2 (two) times daily.   MULTIPLE VITAMIN (MULTIVITAMIN) TABLET    Take 1 tablet by mouth every other day. Centrum Silver    SODIUM CHLORIDE (OCEAN) 0.65 % NASAL SPRAY    Place 1 spray into the nose as needed.  For nasal congestion.   THIAMINE 100 MG TABLET    Take 1 tablet (100 mg total) by mouth daily.  Modified Medications   No medications on file  Discontinued Medications   No medications on file     Physical Exam: Vitals:   10/10/16 1048  BP: 110/73  Pulse: (!) 59  Resp: 20  Temp: 99 F (37.2 C)  SpO2: 94%  Weight: 97 lb (44 kg)  Height: 5\' 5"  (1.651 m)    Physical Exam  Constitutional: She is oriented to person, place, and time. She appears well-developed and well-nourished. No distress.  HENT:    Head: Normocephalic and atraumatic.  Mouth/Throat: Oropharynx is clear and moist. No oropharyngeal exudate.  Eyes: Conjunctivae are normal. Pupils are equal, round, and reactive to light.  Neck: Normal range of motion. Neck supple.  Cardiovascular: Normal rate, regular rhythm and normal heart sounds.   Pulmonary/Chest: Effort normal and breath sounds normal.  Abdominal: Soft. Bowel sounds are normal.  Musculoskeletal: She exhibits no edema or tenderness.  Neurological: She is alert and oriented to person, place, and time. She exhibits abnormal muscle tone. Coordination abnormal.  Arthritic changes in the hands, medial deviations of the DIP joints of the thumbs Significant weakness to right leg with foot drop  Skin: Skin is warm and dry. She is not diaphoretic.  Psychiatric: She has a normal mood and affect.    Labs reviewed: Basic Metabolic Panel:  Recent Labs  09/15/16 0239 09/16/16 0416 09/17/16 0359 09/22/16  NA 131* 130* 129* 133*  K 4.3 4.6 4.3 4.2  CL 98* 95* 94*  --   CO2 25 24 23   --   GLUCOSE 96 91 103*  --   BUN 6 10 12 13   CREATININE 0.52 0.64 0.65 0.6  CALCIUM 9.0 9.6 9.6  --   MG 2.0 1.8 1.9  --    Liver Function Tests:  Recent Labs  09/12/16 1328 09/13/16 0410  AST 33 27  ALT 22 21  ALKPHOS 63 59  BILITOT 1.9* 1.4*  PROT 5.7* 5.3*  ALBUMIN 3.8 3.3*   No results for input(s): LIPASE, AMYLASE in the last 8760 hours. No results for input(s): AMMONIA in the last 8760 hours. CBC:  Recent Labs  09/13/16 0410 09/14/16 0325 09/16/16 0416  WBC 7.0 7.9 6.4  HGB 14.0 14.4 15.1*  HCT 41.1 41.1 43.2  MCV 97.6 95.4 95.2  PLT 197 195 230   TSH:  Recent Labs  09/12/16 1328  TSH 2.227   A1C: No results found for: HGBA1C Lipid Panel: No results for input(s): CHOL, HDL, LDLCALC, TRIG, CHOLHDL, LDLDIRECT in the last 8760 hours.    Assessment/Plan 1. Paroxysmal atrial fibrillation (HCC) HR variable, conts to wear 30 day monitor will follow up  with cardiology as outpatient.  - diltiazem (TIAZAC) 120 MG 24 hr capsule; Take 1 capsule (120 mg total) by mouth daily.  Dispense: 30 capsule; Refill: 0 - apixaban (ELIQUIS) 2.5 MG TABS tablet; Take 1 tablet (2.5 mg total) by mouth 2 (two) times daily.  Dispense: 60 tablet; Refill: 0 - thiamine 100 MG tablet; Take 1 tablet (100 mg total) by mouth daily.  Dispense: 30 tablet; Refill: 0  2. Chronic diastolic heart failure (HCC) Stable, euvolemic at this time, conts to follow up with cardiology.  - lisinopril (PRINIVIL,ZESTRIL) 20 MG tablet; Take 0.5 tablets (10 mg total) by mouth 2 (two) times daily.  Dispense: 30 tablet; Refill: 0  3. Electrolyte imbalance Stable at this time,  will need further follow up by PCP  4. Generalized weakness During hospitalization imaging revealed multilevel degenerative spondylosis with canal and foraminal narrowing, however, the degree did not explain severity of her weakness. She has now been referred to neurology for further evaluation and electrodiagnostic testing.   pt is stable for discharge home however strongly encouraged assistance in the home and for pt to wear a life alert or a call system if she was to fall. Pt has family support and son checks on her frequently.  -will need PT/OT/Nursing/HHA per home health.  RX sent via epic to pharmacy.  To follow up with PCP in 1-2 weeks     Danita Proud K. Harle Battiest  [facility] [phone] 8 am - 5 pm) [phone] (after hours)

## 2016-10-11 ENCOUNTER — Encounter (HOSPITAL_COMMUNITY): Payer: Self-pay | Admitting: Nurse Practitioner

## 2016-10-11 ENCOUNTER — Ambulatory Visit (HOSPITAL_COMMUNITY)
Admission: RE | Admit: 2016-10-11 | Discharge: 2016-10-11 | Disposition: A | Discharge status: Home / Self Care | Payer: No Typology Code available for payment source | Source: Ambulatory Visit | Attending: Nurse Practitioner | Admitting: Nurse Practitioner

## 2016-10-11 VITALS — BP 142/78 | HR 78 | Ht 65.0 in

## 2016-10-11 DIAGNOSIS — R634 Abnormal weight loss: Secondary | ICD-10-CM | POA: Insufficient documentation

## 2016-10-11 DIAGNOSIS — I110 Hypertensive heart disease with heart failure: Secondary | ICD-10-CM | POA: Diagnosis not present

## 2016-10-11 DIAGNOSIS — I4891 Unspecified atrial fibrillation: Secondary | ICD-10-CM

## 2016-10-11 DIAGNOSIS — I712 Thoracic aortic aneurysm, without rupture: Secondary | ICD-10-CM | POA: Diagnosis not present

## 2016-10-11 DIAGNOSIS — I491 Atrial premature depolarization: Secondary | ICD-10-CM | POA: Insufficient documentation

## 2016-10-11 DIAGNOSIS — F419 Anxiety disorder, unspecified: Secondary | ICD-10-CM | POA: Diagnosis not present

## 2016-10-11 DIAGNOSIS — R42 Dizziness and giddiness: Secondary | ICD-10-CM | POA: Insufficient documentation

## 2016-10-11 DIAGNOSIS — Z87891 Personal history of nicotine dependence: Secondary | ICD-10-CM | POA: Insufficient documentation

## 2016-10-11 DIAGNOSIS — I5032 Chronic diastolic (congestive) heart failure: Secondary | ICD-10-CM | POA: Insufficient documentation

## 2016-10-11 DIAGNOSIS — R531 Weakness: Secondary | ICD-10-CM | POA: Diagnosis not present

## 2016-10-11 DIAGNOSIS — I2510 Atherosclerotic heart disease of native coronary artery without angina pectoris: Secondary | ICD-10-CM | POA: Insufficient documentation

## 2016-10-11 DIAGNOSIS — F329 Major depressive disorder, single episode, unspecified: Secondary | ICD-10-CM | POA: Insufficient documentation

## 2016-10-11 DIAGNOSIS — I480 Paroxysmal atrial fibrillation: Secondary | ICD-10-CM | POA: Insufficient documentation

## 2016-10-11 DIAGNOSIS — Z7901 Long term (current) use of anticoagulants: Secondary | ICD-10-CM | POA: Insufficient documentation

## 2016-10-11 NOTE — Progress Notes (Signed)
Primary Care Physician: Mayra Neer, MD Referring Physician: Dr. Bufford Lope Reeb is a 82 y.o. female that is in the afib clinic for follow up of recent hospitalization.. She has h/o depression, lymphoma, hypertension, tobacco and alcohol use, chronic diastolic CHF,CAD noted on CT in 2014, dilated ascending aorta, and atrial fibrillation presented to [facility] on 09/11/2016 for evaluation of generalized weakness, dizziness, and fall. She has had progressive weakness of rt leg for some months and was dx with foraminal stenosis and  herniated disc in the cervical spine.She was on meds cardizem 120 mg a day, eliquis 2.5 mg bid and lisinopril 20 bid. Pt reports that she has been more depressed at home and was drinking 3-5 ozs of vodka a day, had lost her appetite and has lost 10 lbs over the last 3 months. She had intermittent afib in the hospital, was bradycardic with heart rates in the 40's and was noted to have low potassium and magnesium. Cardizem was held and she was discharged on cardizem 30 mg to use as needed for fast heart rate. 30 day event monitor was also arranged as an outpt.  Since she has been wearing the event monitor, no significant brady has been noted but she has had paroxysmal  afib with RVR with heart rate up to 170 bpm. The episodes are of short duration, pt does not feel them but does  have intermittent spells of weakness, ? if afib related. She was encouraged to report her symptoms with the cardiac event device to see if correlates with rhythm..She was placed back on Cardizem 120 mg qd. She had another episode to be reported with RVR  and Cardizem was increased to 180 mg qd which pt takes at 2pm. She is at [facility] since discharge and hopes to be able to return home.  The daughter accompanies her mother today and she is concerned that she seemed to be so much stronger off the long acting Cardizem in the hospital and now has had progressive weakness in the nursing  facility that she can barely do P.T. She questions if the Cardizem is contributing. Pt does continue  to smoke but has not had alcohol since coming in the hospital. She is also seeing neurology for weakness and is undergoing evaluation.Has h/o anxiety.  F/u afib clinic 11/7 for f/u of drug changes last visit.  Lisinopril dose was cut in half and cardizem was decreased to 120 mg qd and moved to bedtime.She reports that she feels much better. Less dizziness and more energy to do PT. She is planning  to return home from [place] on Friday. EKG today shows SR and she continues wearing event monitor.   Today, she denies symptoms of chest pain, shortness of breath, orthopnea, PND, lower extremity edema  presyncope, syncope, or neurologic sequela. Positive for weakness, lightheadedness, chronic congested cough. The patient is tolerating medications without difficulties and is otherwise without complaint today.   Past Medical History:  Diagnosis Date  . Anxiety and depression   . Ascending aortic aneurysm ([place])   . Follicular lymphoma ([place])   . Hypertension   . Smoking    Past Surgical History:  Procedure Laterality Date  . colonoscopy    . thyroid nodule excision    . TONSILLECTOMY    . TUBAL LIGATION      Current Outpatient Prescriptions  Medication Sig Dispense Refill  . apixaban (ELIQUIS) 2.5 MG TABS tablet Take 1 tablet (2.5 mg total) by mouth 2 (  two) times daily. 60 tablet 0  . diltiazem (TIAZAC) 120 MG 24 hr capsule Take 1 capsule (120 mg total) by mouth daily. 30 capsule 0  . lisinopril (PRINIVIL,ZESTRIL) 20 MG tablet Take 0.5 tablets (10 mg total) by mouth 2 (two) times daily. 30 tablet 0  . Multiple Vitamin (MULTIVITAMIN) tablet Take 1 tablet by mouth every other day. Centrum Silver     . sodium chloride (OCEAN) 0.65 % nasal spray Place 1 spray into the nose as needed. For nasal congestion.    . thiamine 100 MG tablet Take 1 tablet (100 mg total) by mouth daily. 30 tablet 0   No  current facility-administered medications for this encounter.     Allergies  Allergen Reactions  . Codeine Nausea And Vomiting    Social History   Social History  . Marital status: Divorced    Spouse name: N/A  . Number of children: N/A  . Years of education: N/A   Occupational History  . Not on file.   Social History Main Topics  . Smoking status: Former Smoker    Packs/day: 0.50    Years: 62.00    Types: Cigarettes  . Smokeless tobacco: Never Used     Comment: Quit September 10, 2016  . Alcohol use Yes     Comment: 2-5 oz vodka daily   . Drug use: No  . Sexual activity: Not Currently     Comment: MPOA son Suzi Mcglown   Other Topics Concern  . Not on file   Social History Narrative   Currently living in [place] [facility]. Lives alone in a one story home.  Has 3 children.  Retired from Personal assistant.  Education: high school.    Family History  Problem Relation Age of Onset  . Cancer Mother 64    colon cancer  . Cancer Maternal Aunt     multiple aunts have colon cancer  . Lung cancer Brother   . Other Brother     post tramactic stress disorder    ROS- All systems are reviewed and negative except as per the HPI above  Physical Exam: Vitals:   10/11/16 1505  BP: (!) 142/78  Pulse: 78  Height: 5\' 5"  (1.651 m)    GEN- The patient is well appearing, alert and oriented x 3 today.   Head- normocephalic, atraumatic Eyes-  Sclera clear, conjunctiva pink Ears- hearing intact Oropharynx- clear Neck- supple, no JVP Lymph- no cervical lymphadenopathy Lungs- Clear to ausculation bilaterally, normal work of breathing Heart- Regular rate and rhythm, no murmurs, rubs or gallops, PMI not laterally displaced GI- soft, NT, ND, + BS Extremities- no clubbing, cyanosis, or edema MS- no significant deformity or atrophy Skin- no rash or lesion Psych- euthymic mood, full affect Neuro- strength and sensation are intact  EKG-SR with PAC's pr int 168 ms, qrs int 68 ms, qtc 462  ms Epic Records reviewed    Assessment and Plan: 1. Paroxysmal afib with RVR Has been in SR with clinic visits with me Slow v response while in hospital but has not had any brady reported on event monitor, but has had episodes of afib with rvr Continue Cardizem 120 mg at hs Continue wearing event monitor and be sure to mark spells of lightheadedness to see if correlates with arrythmia Continue xarelto at 2.5 mg bid for chadsvasc score of at at least 4 Do not see need for antiarrythmic at this point but may have to be reconsidered if afib burden increases  2.Weakness Improved with recent med change Possibly multifactorial Continue PT Continue evaluation with Neurology Decrease lisinopril to 10 mg bid, with weight loss of 10 lbs, may be overmedicated for BP   F/u  afib clinic as needed Pt states that she is due appointment with Dr. Tamala Julian and will schedule  Geroge Baseman. Irl Bodie, [facility] [address] [place], [place] [address] [phone]

## 2016-10-13 ENCOUNTER — Other Ambulatory Visit: Payer: Self-pay | Admitting: *Deleted

## 2016-10-13 ENCOUNTER — Ambulatory Visit (INDEPENDENT_AMBULATORY_CARE_PROVIDER_SITE_OTHER): Payer: Medicare Other | Admitting: Neurology

## 2016-10-13 DIAGNOSIS — R471 Dysarthria and anarthria: Secondary | ICD-10-CM

## 2016-10-13 DIAGNOSIS — R531 Weakness: Secondary | ICD-10-CM

## 2016-10-13 DIAGNOSIS — M6250 Muscle wasting and atrophy, not elsewhere classified, unspecified site: Secondary | ICD-10-CM

## 2016-10-13 DIAGNOSIS — R253 Fasciculation: Secondary | ICD-10-CM

## 2016-10-13 DIAGNOSIS — R292 Abnormal reflex: Secondary | ICD-10-CM

## 2016-10-13 NOTE — Patient Outreach (Signed)
[facility]) Care Management  10/13/2016  Lauren Maxwell [birth year] [id number]   Met with patient at bedside, discussed [facility] care management program. Patient denies any post discharge needs at this time.  She states she has support from son and daughter. She denies any issues with medications or transportation. Patient given [facility] brochure for future consideration. RNCM will sign off, but will remain available for additional [facility] program services consult needs arise. Royetta Crochet. Laymond Purser, RN, BSN, [facility] Care Coordinator [phone]

## 2016-10-13 NOTE — Procedures (Signed)
[facility]  [place]  [place], [place] [address] Tel: [phone] Fax:  [phone] Test Date:  10/13/2016  Patient: Lauren Maxwell DOB: 1931/09/18 Physician: Narda Amber, DO  Sex: Female Height: 5\' 5"  Ref Phys: Narda Amber, DO  ID#: [id number] Temp: 33.2C Technician: Jerilynn Mages. Dean   Patient Complaints: This is a 82 year old female referred for evaluation of generalized weakness, fasciculations, and right foot drop.   NCV & EMG Findings: Extensive electrodiagnostic testing of the right upper and lower extremity shows:  1. Right median and ulnar sensory responses are within normal limits.  Right sural and superficial peroneal sensory responses are absent, which is most likely normal for patient's age. 2. All motor responses show reduced amplitude including the right median, ulnar, peroneal, and tibial nerves. 3. Bilateral tibial H reflex studies are prolonged. 4. In the right upper extremity, moderate active on chronic motor axon loss changes are seen affecting nearly all the tested muscles involving the C5-T1 myotomes. 5. In the right lower extremity, no motor unit recruitment is seen in the tibialis anterior despite maximal activation. Severe chronic motor axon loss changes are seen affecting the gastrocnemius, rectus femoris and biceps femoris short head muscles.  Active fibrillation potentials are seen affecting nearly all the tested muscles of the lower extremity.  Due to patient being on anticoagulation therapy, caution was taken to avoid proximal and deep muscles. 6. Fasciculation potentials are present in 4 of the 11 tested muscles.   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     ___________________________ Narda Amber, DO    Nerve Conduction Studies Anti Sensory Summary Table   Site NR Peak (ms) Norm Peak (ms) P-T Amp (V)  Norm P-T Amp  Right Median Anti Sensory (2nd Digit)  33.2C  Wrist    3.5 <3.8 25.8 >10  Right Sup Peroneal Anti Sensory (Ant Lat Mall)  12 cm NR  <4.6  >3  Right Sural Anti Sensory (Lat Mall)  Calf NR  <4.6  >3  Right Ulnar Anti Sensory (5th Digit)  33.2C  Wrist    2.9 <3.2 10.7 >5   Motor Summary Table   Site NR Onset (ms) Norm Onset (ms) O-P Amp (mV) Norm O-P Amp Site1 Site2 Delta-0 (ms) Dist (cm) Vel (m/s) Norm Vel (m/s)  Right Median Motor (Abd Poll Brev)  33.2C  Wrist    3.5 <4.0 2.6 >5 Elbow Wrist 4.8 25.0 52 >50  Elbow    8.3  1.9         Right Peroneal Motor (Ext Dig Brev)  Ankle NR  <6.0  >2.5 B Fib Ankle  0.0  >40  B Fib NR     Poplt B Fib  0.0  >40  Poplt NR            Right Peroneal TA Motor (Tib Ant)  Fib Head    3.4 <4.5 0.7 >3 Poplit Fib Head 2.3 10.0 43 >40  Poplit    5.7  0.6         Right Tibial Motor (Abd Hall Brev)  Ankle    5.2 <6.0 0.5 >4 Knee Ankle 11.7 37.0 32 >40  Knee    16.9  0.2         Right Ulnar Motor (Abd Dig Minimi)  33.2C  Wrist    3.0 <3.1 4.8 >7 B Elbow Wrist 3.8 22.0 58 >50  B  Elbow    6.8  4.4  A Elbow B Elbow 1.8 10.0 56 >50  A Elbow    8.6  3.9          H Reflex Studies   NR H-Lat (ms) Lat Norm (ms) L-R H-Lat (ms) M-Lat (ms) HLat-MLat (ms)  Left Tibial (Gastroc)     40.00 <35 0.00 6.26 33.74  Right Tibial (Gastroc)     40.00 <35 0.00 6.26 33.74   EMG   Side Muscle Ins Act Fibs Psw Fasc Number Recrt Dur Dur. Amp Amp. Poly Poly. Comment  Right AntTibialis Nml 2+ Nml Nml NE None - - - - - - ATR  Right Abd Poll Brev Nml Nml Nml Nml 2- Rapid Some 1+ Some 1+ Nml Nml ATR  Right Gastroc Nml 1+ Nml 1+ SMU Rapid All 1+ All 1+ All 1+ N/A  Right Biceps Nml 1+ Nml 1+ 1- Rapid Some 1+ Some 1+ Nml Nml N/A  Right BicepsFemS Nml Nml Nml Nml 3- Rapid Most 1+ Many 1+ Nml Nml N/A  Right 1stDorInt Nml 1+ Nml Nml 2- Rapid Many 1+ Many 1+ Many 1+ ATR  Right Triceps Nml Nml Nml 2+ 2- Rapid Some 1+ Some 1+ Nml Nml N/A  Right Deltoid Nml Nml Nml 1+  2- Rapid Some 1+ Some 1+ Nml Nml N/A  Right PronatorTeres Nml 1+ Nml Nml 1- Rapid Some 1+ Some 1+ Nml Nml N/A  Right RectFemoris Nml 1+ Nml Nml SMU Rapid All 1+ All 1+ All 1+ ATR  Right Ext Indicis Nml Nml Nml Nml 1- Rapid Some 1+ Some 1+ Nml Nml N/A   Waveforms:

## 2016-10-14 ENCOUNTER — Telehealth: Payer: Self-pay | Admitting: Interventional Cardiology

## 2016-10-14 NOTE — Telephone Encounter (Addendum)
Spoke with Nafisa at [facility] and they have not received a follow up transmission from pt yet.

## 2016-10-14 NOTE — Telephone Encounter (Signed)
Spoke with pt's son and he states that pt has been home since around 10:30AM and he was with her until about 12:45PM.  Son states pt did not have any complaints about heart racing, dizziness or lightheadedness while he was with her.  Son states pt now at home resting. Spoke with Dr. Saunders Revel, DOD and he would like to obtain a follow up tracing.  If pt still rapid he would like for pt to go to ED for evaluation, if in normal rhythm, no changes.  Spoke with representative at [facility] and she states they have tried to reach pt several times to obtain a follow up tracing but have been unsuccessful.

## 2016-10-14 NOTE — Telephone Encounter (Signed)
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 Strips to be faxed to our office.

## 2016-10-14 NOTE — Telephone Encounter (Signed)
Spoke with son again and he states he will call pt and have her hit the button on her monitor to send in a follow up tracing.

## 2016-10-14 NOTE — Telephone Encounter (Signed)
Spoke with nurse at [facility] and she states pt was discharged at [facility].  She also states pt did have her Lisinopril and Diltiazem prior to leaving facility.   Left VM on pt's home phone to call back.

## 2016-10-14 NOTE — Telephone Encounter (Signed)
Mew Message:    Abnormal EKG

## 2016-10-17 NOTE — Telephone Encounter (Signed)
Left message to call back  

## 2016-10-17 NOTE — Telephone Encounter (Signed)
Thanks for trying to reach her. I have heard nothing about this until now.

## 2016-10-18 NOTE — Telephone Encounter (Signed)
Left message to call back  

## 2016-10-21 ENCOUNTER — Ambulatory Visit: Payer: Medicare Other | Admitting: Neurology

## 2016-10-21 ENCOUNTER — Telehealth: Payer: Self-pay | Admitting: Neurology

## 2016-10-21 NOTE — Telephone Encounter (Signed)
Patient was scheduled for a follow-up visit to discuss her EMG results at 11:30am today.  Her son, Annie Main [facility]), called and stated that Ms. Moede was not feeling well and unable to make her appointment.  Her son was willing to come to the appointment himself, but it was explained that we needed to have the patient present for the visit. Instead, it was decided to have a telephone call with Ms. Capshaw and Annie Main both present to discuss the results of her testing.  As much as I did not want to share the results of her testing over the telephone, her motor weakness is making it increasingly difficult for Ms. Stoneberg to make her out-patient appointments.  Therefore, I decided to respectfully inform Ms. Ashurst and her son that the testing shows a widespread disorder of anterior horn cells, consistent with amyotrophic lateral sclerosis (ALS).   We had a lengthy discussion regarding the management and natural course of ALS.  I informed them of the only two FDA approved medications, riluzole and Radicava - none of which are curative but may slow down the disease progression by a few months.  She will think about these and let me know, if she chooses to move forward with Riluzole. Her disease progression is too far for Radicava.  Finally, with her having two falls at home, I addressed home safety.  She is on Eliquis for atrial fibrillation and suffered at least two falls. It is certain that as her motor deficits progress, her risk of falls will also increased until she is immobile.  Risks and benefits of anticoagulation therapy was discussed and she has chosen continue Eliquis for now.  At this time, the goal of management is maximizing resources to maintain quality of life and address symptoms.  She is living a lone, but needs help with basic ADLs such as bathing, dressing, meal preparation, etc.  Her son has set up home medical alert and frequently check on her, but he is concerned that they cannot provide the level  of care Ms. Nine needs at home.  Options include palliative care services at home or transfer into a SNF.  They would like to keep her at home with palliative services for now, which we will coordinate.  All questions were answered.  Donika K. Posey Pronto, DO

## 2016-10-21 NOTE — Telephone Encounter (Signed)
Lauren Maxwell [birth year]. She is scheduled to see Dr. Posey Pronto today at 11:15 but is not feeling well enough to come today. Her son Richardson Landry called asking could he come in her place? I explained that it needed to be the patient and that he could come as well. He said he wanted to talk to you or Dr. Posey Pronto. He said this was very serious and needs to know everything. His # is [id number]. Thank you

## 2016-10-21 NOTE — Telephone Encounter (Signed)
Dr. Posey Pronto will call patient's son.

## 2016-10-25 NOTE — Telephone Encounter (Signed)
Late Entry:  Spoke with Dr. Tamala Julian yesterday and he said ok for pt to go ahead and remove monitor.  Spoke with pt and she states that she has already removed it.  Pt states she didn't wear it all the time, just off and on.  Advised pt ok to go ahead and mail it back and I will call with results as soon as they are available.  Pt appreciative for call.

## 2016-11-03 ENCOUNTER — Telehealth: Payer: Self-pay | Admitting: *Deleted

## 2016-11-03 NOTE — Telephone Encounter (Signed)
I'm sorry to hear this.  Please do make the referral to [facility].  [place]one K. Posey Pronto, DO

## 2016-11-03 NOTE — Telephone Encounter (Signed)
I spoke with Lauren Maxwell and she will have hospice fax over the referral form.

## 2016-11-03 NOTE — Telephone Encounter (Signed)
Archie Endo, NP with hospice called to let you know that patient has had a significant decline in just a couple of days.  She is no longer able to walk.  She is requesting for patient to be changed to hospice instead of palliative care.  Please advise.  [phone]

## 2017-01-05 DEATH — deceased
